# Patient Record
Sex: Female | Born: 1960 | Race: White | Hispanic: No | Smoking: Never smoker
Health system: Southern US, Community
[De-identification: ages and names within clinical notes are randomized; demographics above are authoritative.]

## PROBLEM LIST (undated history)

## (undated) DIAGNOSIS — B9561 Methicillin susceptible Staphylococcus aureus infection as the cause of diseases classified elsewhere: Secondary | ICD-10-CM

## (undated) DIAGNOSIS — K760 Fatty (change of) liver, not elsewhere classified: Secondary | ICD-10-CM

## (undated) DIAGNOSIS — E119 Type 2 diabetes mellitus without complications: Secondary | ICD-10-CM

## (undated) DIAGNOSIS — E669 Obesity, unspecified: Secondary | ICD-10-CM

## (undated) DIAGNOSIS — G894 Chronic pain syndrome: Secondary | ICD-10-CM

## (undated) DIAGNOSIS — Z1211 Encounter for screening for malignant neoplasm of colon: Secondary | ICD-10-CM

## (undated) DIAGNOSIS — M545 Low back pain: Secondary | ICD-10-CM

## (undated) DIAGNOSIS — G5603 Carpal tunnel syndrome, bilateral upper limbs: Secondary | ICD-10-CM

## (undated) DIAGNOSIS — I10 Essential (primary) hypertension: Secondary | ICD-10-CM

## (undated) DIAGNOSIS — M009 Pyogenic arthritis, unspecified: Secondary | ICD-10-CM

## (undated) DIAGNOSIS — E042 Nontoxic multinodular goiter: Secondary | ICD-10-CM

## (undated) DIAGNOSIS — R400 Somnolence: Secondary | ICD-10-CM

## (undated) DIAGNOSIS — F32A Depression, unspecified: Secondary | ICD-10-CM

## (undated) DIAGNOSIS — M5136 Other intervertebral disc degeneration, lumbar region: Secondary | ICD-10-CM

## (undated) DIAGNOSIS — S92912A Unspecified fracture of left toe(s), initial encounter for closed fracture: Secondary | ICD-10-CM

## (undated) DIAGNOSIS — E785 Hyperlipidemia, unspecified: Secondary | ICD-10-CM

## (undated) DIAGNOSIS — M51369 Other intervertebral disc degeneration, lumbar region without mention of lumbar back pain or lower extremity pain: Secondary | ICD-10-CM

## (undated) DIAGNOSIS — Z8639 Personal history of other endocrine, nutritional and metabolic disease: Secondary | ICD-10-CM

## (undated) DIAGNOSIS — R7881 Bacteremia: Secondary | ICD-10-CM

## (undated) DIAGNOSIS — G43909 Migraine, unspecified, not intractable, without status migrainosus: Secondary | ICD-10-CM

## (undated) DIAGNOSIS — M722 Plantar fascial fibromatosis: Secondary | ICD-10-CM

## (undated) DIAGNOSIS — N3946 Mixed incontinence: Secondary | ICD-10-CM

## (undated) DIAGNOSIS — N281 Cyst of kidney, acquired: Secondary | ICD-10-CM

## (undated) DIAGNOSIS — D509 Iron deficiency anemia, unspecified: Secondary | ICD-10-CM

## (undated) DIAGNOSIS — F419 Anxiety disorder, unspecified: Secondary | ICD-10-CM

## (undated) DIAGNOSIS — R9439 Abnormal result of other cardiovascular function study: Secondary | ICD-10-CM

## (undated) DIAGNOSIS — F329 Major depressive disorder, single episode, unspecified: Secondary | ICD-10-CM

## (undated) HISTORY — PX: INCISION AND DRAINAGE HIP: SHX1801

## (undated) HISTORY — DX: Chronic pain syndrome: G89.4

## (undated) HISTORY — DX: Bacteremia: R78.81

## (undated) HISTORY — DX: Essential (primary) hypertension: I10

## (undated) HISTORY — DX: Carpal tunnel syndrome, bilateral upper limbs: G56.03

## (undated) HISTORY — DX: Hyperlipidemia, unspecified: E78.5

## (undated) HISTORY — DX: Pyogenic arthritis, unspecified: M00.9

## (undated) HISTORY — DX: Depression, unspecified: F32.A

## (undated) HISTORY — DX: Migraine, unspecified, not intractable, without status migrainosus: G43.909

## (undated) HISTORY — DX: Obesity, unspecified: E66.9

## (undated) HISTORY — DX: Anxiety disorder, unspecified: F41.9

## (undated) HISTORY — DX: Plantar fascial fibromatosis: M72.2

## (undated) HISTORY — DX: Type 2 diabetes mellitus without complications: E11.9

## (undated) HISTORY — DX: Low back pain: M54.5

## (undated) HISTORY — DX: Encounter for screening for malignant neoplasm of colon: Z12.11

## (undated) HISTORY — DX: Somnolence: R40.0

## (undated) HISTORY — DX: Other intervertebral disc degeneration, lumbar region: M51.36

## (undated) HISTORY — DX: Iron deficiency anemia, unspecified: D50.9

## (undated) HISTORY — DX: Unspecified fracture of left toe(s), initial encounter for closed fracture: S92.912A

## (undated) HISTORY — DX: Major depressive disorder, single episode, unspecified: F32.9

## (undated) HISTORY — DX: Personal history of other endocrine, nutritional and metabolic disease: Z86.39

## (undated) HISTORY — DX: Mixed incontinence: N39.46

## (undated) HISTORY — DX: Nontoxic multinodular goiter: E04.2

## (undated) HISTORY — DX: Abnormal result of other cardiovascular function study: R94.39

## (undated) HISTORY — DX: Methicillin susceptible Staphylococcus aureus infection as the cause of diseases classified elsewhere: B95.61

## (undated) HISTORY — DX: Other intervertebral disc degeneration, lumbar region without mention of lumbar back pain or lower extremity pain: M51.369

---

## 2007-06-17 DIAGNOSIS — Z8639 Personal history of other endocrine, nutritional and metabolic disease: Secondary | ICD-10-CM

## 2007-06-17 HISTORY — DX: Personal history of other endocrine, nutritional and metabolic disease: Z86.39

## 2010-08-26 ENCOUNTER — Emergency Department (HOSPITAL_COMMUNITY): Payer: No Typology Code available for payment source

## 2010-08-26 ENCOUNTER — Emergency Department (HOSPITAL_COMMUNITY)
Admission: EM | Admit: 2010-08-26 | Discharge: 2010-08-26 | Disposition: A | Discharge status: Home / Self Care | Payer: No Typology Code available for payment source | Attending: Emergency Medicine | Admitting: Emergency Medicine

## 2010-08-26 DIAGNOSIS — Y92410 Unspecified street and highway as the place of occurrence of the external cause: Secondary | ICD-10-CM | POA: Insufficient documentation

## 2010-08-26 DIAGNOSIS — M542 Cervicalgia: Secondary | ICD-10-CM | POA: Insufficient documentation

## 2010-08-26 DIAGNOSIS — IMO0002 Reserved for concepts with insufficient information to code with codable children: Secondary | ICD-10-CM | POA: Insufficient documentation

## 2010-08-26 DIAGNOSIS — M545 Low back pain, unspecified: Secondary | ICD-10-CM | POA: Insufficient documentation

## 2011-06-17 DIAGNOSIS — D509 Iron deficiency anemia, unspecified: Secondary | ICD-10-CM

## 2011-06-17 HISTORY — DX: Iron deficiency anemia, unspecified: D50.9

## 2011-12-06 DIAGNOSIS — F329 Major depressive disorder, single episode, unspecified: Secondary | ICD-10-CM | POA: Insufficient documentation

## 2011-12-11 ENCOUNTER — Encounter: Payer: Self-pay | Admitting: Cardiovascular Disease

## 2011-12-11 ENCOUNTER — Ambulatory Visit (INDEPENDENT_AMBULATORY_CARE_PROVIDER_SITE_OTHER): Payer: 59 | Admitting: Cardiovascular Disease

## 2011-12-11 ENCOUNTER — Ambulatory Visit (INDEPENDENT_AMBULATORY_CARE_PROVIDER_SITE_OTHER): Payer: 59 | Admitting: Family Medicine

## 2011-12-11 ENCOUNTER — Encounter: Payer: Self-pay | Admitting: Family Medicine

## 2011-12-11 VITALS — BP 152/90 | HR 94 | Ht 65.0 in | Wt 255.2 lb

## 2011-12-11 VITALS — BP 147/86 | HR 70 | Temp 96.9°F | Ht 65.0 in | Wt 254.0 lb

## 2011-12-11 DIAGNOSIS — R079 Chest pain, unspecified: Secondary | ICD-10-CM

## 2011-12-11 DIAGNOSIS — I10 Essential (primary) hypertension: Secondary | ICD-10-CM

## 2011-12-11 MED ORDER — SIMVASTATIN 40 MG PO TABS: 40.0000 mg | ORAL_TABLET | Freq: Every day | ORAL | Status: DC

## 2011-12-11 MED ORDER — METOPROLOL TARTRATE 25 MG PO TABS: 25.0000 mg | ORAL_TABLET | Freq: Two times a day (BID) | ORAL | Status: DC
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
## 2011-12-11 NOTE — Patient Instructions (Signed)
Your physician has requested that you have a lexiscan myoview.  Please follow instruction sheet, as given.  Your physician recommends that you schedule a follow-up appointment in: 3 months WITH EKG  Your physician has recommended you make the following change in your medication:   STOP ARMODROFINIL/ NUVIGIL  DECREASE SIMVASTATIN TO 40 MG DAILY  START METOPROL 25 MG ONE TABLET TWICE DAILY 12 HOURS APART FOR HIGH BLOOD PRESSURE AND SLIGHTLY FAST HEART RATE.

## 2011-12-11 NOTE — Assessment & Plan Note (Signed)
Atypical and some typical features as well. Given her cardiac RF's (DM 2, HTN, hyperlipidemia, sedentary, obesity) and the fact that women can often present with CAD with atypical features, I think she needs attention ASAP (but not emergently since she is currently symptom-free) with cardiology.  Dr. Elease Hashimoto at [facility] heart care has graciously agreed to see her this afternoon in his office at 2:15 pm. Discussed this with pt and she agrees to go to this appt.
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
## 2011-12-11 NOTE — Assessment & Plan Note (Signed)
Letrice presents today with atypical episodes of chest pain. These episodes have been occurring for the past several weeks. They occur off and on all day long. They last for as long as 10 minutes. They typically brought on by stress. They're not associated with exercise, eating, treatment, change of position, or taking a deep breath. She was seen by his medical doctor today who referred her over here for further evaluation.  She has multiple risk factors for coronary artery disease including obesity, hypertension, hyperlipidemia, and diabetes mellitus. She also has poor R-wave progression her EKG. I cannot exclude coronary artery disease. I suspect that her poor R wave progression is due to her obesity.  We'll start her on metoprolol 25 mg twice a day. We will see her again in 3 months for followup visit for further evaluation of her heart rate and blood pressure and to make sure that she's tolerating the Toprol. After this I will turn her back over to her medical Dr.

## 2011-12-11 NOTE — Assessment & Plan Note (Signed)
Poor control lately, but normal in office today. Will make no changes in meds at this time, but she'll check bp daily and log it and I'll recheck her in the office next week. In the meantime, she needs to d/c her Nuvigil since this is contraindicated in poorly controlled HTN.

## 2011-12-11 NOTE — Assessment & Plan Note (Signed)
Her heart rate and blood pressure are bit high. I think that she will benefit from low-dose beta blocker.

## 2011-12-19 ENCOUNTER — Ambulatory Visit (INDEPENDENT_AMBULATORY_CARE_PROVIDER_SITE_OTHER): Payer: 59 | Admitting: Family Medicine

## 2011-12-19 ENCOUNTER — Encounter: Payer: Self-pay | Admitting: Family Medicine

## 2011-12-19 VITALS — BP 117/75 | HR 54 | Ht 65.0 in | Wt 258.0 lb

## 2011-12-19 DIAGNOSIS — G8929 Other chronic pain: Secondary | ICD-10-CM | POA: Insufficient documentation

## 2011-12-19 DIAGNOSIS — D509 Iron deficiency anemia, unspecified: Secondary | ICD-10-CM | POA: Insufficient documentation

## 2011-12-19 DIAGNOSIS — E049 Nontoxic goiter, unspecified: Secondary | ICD-10-CM

## 2011-12-19 DIAGNOSIS — Z23 Encounter for immunization: Secondary | ICD-10-CM

## 2011-12-19 DIAGNOSIS — E785 Hyperlipidemia, unspecified: Secondary | ICD-10-CM | POA: Insufficient documentation

## 2011-12-19 DIAGNOSIS — M545 Low back pain, unspecified: Secondary | ICD-10-CM

## 2011-12-19 DIAGNOSIS — E119 Type 2 diabetes mellitus without complications: Secondary | ICD-10-CM

## 2011-12-19 DIAGNOSIS — I10 Essential (primary) hypertension: Secondary | ICD-10-CM

## 2011-12-19 DIAGNOSIS — G4710 Hypersomnia, unspecified: Secondary | ICD-10-CM | POA: Insufficient documentation

## 2011-12-19 DIAGNOSIS — E042 Nontoxic multinodular goiter: Secondary | ICD-10-CM | POA: Insufficient documentation

## 2011-12-19 DIAGNOSIS — R079 Chest pain, unspecified: Secondary | ICD-10-CM

## 2011-12-19 HISTORY — DX: Low back pain, unspecified: M54.50

## 2011-12-19 LAB — CBC WITH DIFFERENTIAL/PLATELET
Basophils Absolute: 0 10*3/uL (ref 0.0–0.1)
Eosinophils Absolute: 0.1 10*3/uL (ref 0.0–0.7)
Lymphocytes Relative: 33.8 % (ref 12.0–46.0)
MCHC: 33.6 g/dL (ref 30.0–36.0)
Monocytes Absolute: 0.3 10*3/uL (ref 0.1–1.0)
Neutrophils Relative %: 59.5 % (ref 43.0–77.0)
Platelets: 188 10*3/uL (ref 150.0–400.0)
RBC: 3.92 Mil/uL (ref 3.87–5.11)
RDW: 13.8 % (ref 11.5–14.6)

## 2011-12-19 LAB — COMPREHENSIVE METABOLIC PANEL
ALT: 16 U/L (ref 0–35)
AST: 16 U/L (ref 0–37)
Albumin: 3.6 g/dL (ref 3.5–5.2)
Alkaline Phosphatase: 58 U/L (ref 39–117)
Calcium: 9.1 mg/dL (ref 8.4–10.5)
Chloride: 103 mEq/L (ref 96–112)
Potassium: 4.1 mEq/L (ref 3.5–5.1)
Sodium: 138 mEq/L (ref 135–145)

## 2011-12-19 LAB — LIPID PANEL
Total CHOL/HDL Ratio: 4
VLDL: 42.6 mg/dL — ABNORMAL HIGH (ref 0.0–40.0)

## 2011-12-19 LAB — MICROALBUMIN / CREATININE URINE RATIO
Microalb Creat Ratio: 0.3 mg/g (ref 0.0–30.0)
Microalb, Ur: 0.5 mg/dL (ref 0.0–1.9)

## 2011-12-19 LAB — LDL CHOLESTEROL, DIRECT: Direct LDL: 94.6 mg/dL

## 2011-12-19 LAB — TSH: TSH: 0.99 u[IU]/mL (ref 0.35–5.50)

## 2011-12-19 MED ORDER — HYDROCODONE-ACETAMINOPHEN 5-500 MG PO TABS: ORAL_TABLET | ORAL | Status: DC

## 2011-12-19 MED ORDER — LAMOTRIGINE 50 MG PO TBDP: 1.5000 | ORAL_TABLET | Freq: Every day | ORAL | Status: DC

## 2011-12-19 NOTE — Assessment & Plan Note (Signed)
Did not get to discuss much today.  She asked at the end of her appt if I could RF her vicodin 5/500 for this, says she gets #60 per month. Says she has DDD, has failed epidural injection in the past, has never had surgery recommended.  Used to see an orthopedist but sounds like it has been years since she last saw one.  I did write rx for her pain meds but will need to get better informed on her past hx and potentially come up with a plan for re-eval.

## 2011-12-19 NOTE — Assessment & Plan Note (Signed)
Numbers not reviewed today, but her most recent HbA1c > 62mo ago was <7%. Will check HbA1c today and urine microalb/cr ratio. Continue metformin at current dosing and will adjust as appropriate based on lab result. Continue ACE-I and diabetic diet.

## 2011-12-19 NOTE — Assessment & Plan Note (Signed)
Recheck FLP today. 

## 2011-12-19 NOTE — Assessment & Plan Note (Signed)
Her iron is being replaced well and her Hb had normalized per pt, but she did not do the hemoccults she was asked to do. I'll have her do iFOB.  If neg, then will allow her to put off colonoscopy until she feels more ready.  If +, will push her to get the colonoscopy sooner.  She has stopped the excessive NSAIDs she said she was on around the time of her dx of iron def anemia. Continue iron bid for now, check CBC today.
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
## 2011-12-19 NOTE — Assessment & Plan Note (Signed)
Improved. She will continue current meds and undergo a stress test soon.

## 2011-12-19 NOTE — Assessment & Plan Note (Signed)
Problem stable.  May restart nuvigil.  Continue current bp meds. Will check cr/lytes today.

## 2011-12-19 NOTE — Assessment & Plan Note (Addendum)
X years; has had multiple stable u/s's and multiple benign biopsies.  +FH of follicular thyroid cancer (Mom). Check TSH, free T4, and T3 total today. When I get old records, will review to see when last bx and u/s were done.

## 2011-12-19 NOTE — Assessment & Plan Note (Signed)
Need to r/o OSA.  She scored in high risk range on sleep questionnaire today. Continue nuvigil but I have ordered a split night sleep study to be done at [facility] sleep center.

## 2011-12-23 ENCOUNTER — Ambulatory Visit (HOSPITAL_COMMUNITY): Payer: 59 | Attending: Cardiology | Admitting: Radiology

## 2011-12-23 VITALS — Ht 65.0 in | Wt 256.0 lb

## 2011-12-23 DIAGNOSIS — R0989 Other specified symptoms and signs involving the circulatory and respiratory systems: Secondary | ICD-10-CM | POA: Insufficient documentation

## 2011-12-23 DIAGNOSIS — R42 Dizziness and giddiness: Secondary | ICD-10-CM | POA: Insufficient documentation

## 2011-12-23 DIAGNOSIS — R5383 Other fatigue: Secondary | ICD-10-CM | POA: Insufficient documentation

## 2011-12-23 DIAGNOSIS — R0602 Shortness of breath: Secondary | ICD-10-CM

## 2011-12-23 DIAGNOSIS — R5381 Other malaise: Secondary | ICD-10-CM | POA: Insufficient documentation

## 2011-12-23 DIAGNOSIS — E119 Type 2 diabetes mellitus without complications: Secondary | ICD-10-CM | POA: Insufficient documentation

## 2011-12-23 DIAGNOSIS — M25519 Pain in unspecified shoulder: Secondary | ICD-10-CM | POA: Insufficient documentation

## 2011-12-23 DIAGNOSIS — R0609 Other forms of dyspnea: Secondary | ICD-10-CM | POA: Insufficient documentation

## 2011-12-23 DIAGNOSIS — I10 Essential (primary) hypertension: Secondary | ICD-10-CM | POA: Insufficient documentation

## 2011-12-23 DIAGNOSIS — E785 Hyperlipidemia, unspecified: Secondary | ICD-10-CM | POA: Insufficient documentation

## 2011-12-23 DIAGNOSIS — R079 Chest pain, unspecified: Secondary | ICD-10-CM | POA: Insufficient documentation

## 2011-12-23 MED ORDER — TECHNETIUM TC 99M TETROFOSMIN IV KIT
32.9000 | PACK | Freq: Once | INTRAVENOUS | Status: AC | PRN
  Administered 2011-12-23: 32.9 via INTRAVENOUS

## 2011-12-23 MED ORDER — REGADENOSON 0.4 MG/5ML IV SOLN
0.4000 mg | Freq: Once | INTRAVENOUS | Status: AC
  Administered 2011-12-23: 0.4 mg via INTRAVENOUS

## 2011-12-23 NOTE — Progress Notes (Signed)
[facility] SITE 3 NUCLEAR MED [address] [place] [address] [phone]  Cardiology Nuclear Med Study  Dwight Burdo is a 51 y.o. female     MRN : [id number]     DOB: September 22, 1960  Procedure Date: 12/23/2011  Nuclear Med Background Indication for Stress Test:  Evaluation for Ischemia and Abnormal OZH:YQMV R wave progression History:  No prior known history of CAD Cardiac Risk Factors: Hypertension, Lipids and NIDDM  Symptoms: Chest Pain without exertion radiates to left shoulder and arm (last occurrence this am),  Dizziness, DOE and Fatigue with Exertion   Nuclear Pre-Procedure Caffeine/Decaff Intake:  9:00pm NPO After: 8:30am   Lungs:  clear O2 Sat: 97% on room air. IV 0.9% NS with Angio Cath:  20g  IV Site: R Antecubital  IV Started by:  Cathlyn Parsons, RN  Chest Size (in):  44 Cup Size: DD  Height: 5\' 5"  (1.651 m)  Weight:  256 lb (116.121 kg)  BMI:  Body mass index is 42.60 kg/(m^2). Tech Comments:  Metoprolol held x 14 hrs    Nuclear Med Study 1 or 2 day study: 2 day  Stress Test Type:  Treadmill/Lexiscan  Reading HQ:IONGE Nishan,M.D. Order Authorizing Provider:  Jannette Spanner  Resting Radionuclide: Technetium 28m Tetrofosmin  Resting Radionuclide Dose: 33.0 mCi on       12-24-11  Stress Radionuclide:  Technetium 73m Tetrofosmin  Stress Radionuclide Dose: 32.9 mCi on          12-23-11          Stress Protocol Rest HR: 72 Stress HR: 131  Rest BP: 131/73 Stress BP: 189/65  Exercise Time (min): 2:00 METS: n/a   Predicted Max HR: 169 bpm % Max HR: 77.51 bpm Rate Pressure Product: 95284   Dose of Adenosine (mg):  n/a Dose of Lexiscan: 0.4 mg  Dose of Atropine (mg): n/a Dose of Dobutamine: n/a mcg/kg/min (at max HR)  Stress Test Technologist: Irean Hong, RN  Nuclear Technologist:  Domenic Polite, CNMT     Rest Procedure:  Myocardial perfusion imaging was performed at rest 45 minutes following the intravenous administration of  Technetium 58m Tetrofosmin. Rest ECG: NSR with poor R wave progression  Stress Procedure:  The patient received IV Lexiscan 0.4 mg over 15-seconds with concurrent low level exercise and then Technetium 28m Tetrofosmin was injected at 30-seconds while the patient continued walking one more minute. There were no significant changes with Lexiscan. Quantitative spect images were obtained after a 45-minute delay. Stress ECG: No significant change from baseline ECG  QPS Raw Data Images:  Normal; no motion artifact; normal heart/lung ratio. Stress Images:  There is decreased uptake in the anterior wall. Rest Images:  There is decreased uptake in the anterior wall. Subtraction (SDS):  These findings are consistent with ischemia. Transient Ischemic Dilatation (Normal <1.22):  0.90 Lung/Heart Ratio (Normal <0.45):  0.32  Quantitative Gated Spect Images QGS EDV:  123 ml QGS ESV:  46 ml  Impression Exercise Capacity:  Lexiscan with low level exercise. BP Response:  Normal blood pressure response. Clinical Symptoms:  Light headed ECG Impression:  No significant ST segment change suggestive of ischemia. Comparison with Prior Nuclear Study: No images to compare  Overall Impression:  Low risk stress nuclear study. Small area of mild apical ischemia  LV Ejection Fraction: 63%.  LV Wall Motion:  NL LV Function; NL Wall Motion  Charlton Haws  .  Charlton Haws

## 2011-12-24 ENCOUNTER — Ambulatory Visit (HOSPITAL_COMMUNITY): Payer: 59 | Attending: Cardiovascular Disease

## 2011-12-24 DIAGNOSIS — R0989 Other specified symptoms and signs involving the circulatory and respiratory systems: Secondary | ICD-10-CM

## 2011-12-24 MED ORDER — TECHNETIUM TC 99M TETROFOSMIN IV KIT
33.0000 | PACK | Freq: Once | INTRAVENOUS | Status: AC | PRN
  Administered 2011-12-24: 33 via INTRAVENOUS

## 2011-12-25 ENCOUNTER — Encounter: Payer: Self-pay | Admitting: Family Medicine

## 2011-12-29 ENCOUNTER — Other Ambulatory Visit: Payer: Self-pay | Admitting: Family Medicine

## 2011-12-29 ENCOUNTER — Telehealth: Payer: Self-pay | Admitting: Cardiovascular Disease

## 2011-12-29 MED ORDER — SITAGLIPTIN PHOS-METFORMIN HCL 50-1000 MG PO TABS: 1.0000 | ORAL_TABLET | Freq: Two times a day (BID) | ORAL | Status: DC

## 2011-12-29 NOTE — Telephone Encounter (Signed)
Pt would like results from [facility]

## 2011-12-29 NOTE — Telephone Encounter (Signed)
Patient called wanting to know myoview results.Patient was told Dr.Nasher wants to review images and then we will call her back.

## 2011-12-30 NOTE — Telephone Encounter (Signed)
Pt called to inform her dr Kristi Guerrero will look at it this afternoon and i will call her after. Pt agreed to plan.

## 2011-12-30 NOTE — Telephone Encounter (Signed)
Dr Elease Hashimoto contacted to read results.

## 2011-12-30 NOTE — Telephone Encounter (Signed)
Dr Elease Hashimoto has note stating mild ischemia vs apical thinning, call pt and see how she is feeling and proceed with LHC if sx . Pt very anxious on phone, denies cp but said she had some left arm pain today but realizes she is very nervous and it may be her nerves, pt willing to proceed with LHC, will call in morning with app. Pt agreed to plan, she is to go to er with further s/s.

## 2011-12-30 NOTE — Telephone Encounter (Signed)
Fu call  Pt calling you back again

## 2011-12-30 NOTE — Telephone Encounter (Signed)
Fu call °Pt calling about test results °

## 2011-12-31 ENCOUNTER — Telehealth: Payer: Self-pay | Admitting: *Deleted

## 2011-12-31 ENCOUNTER — Other Ambulatory Visit: Payer: Self-pay | Admitting: Cardiovascular Disease

## 2011-12-31 ENCOUNTER — Encounter: Payer: Self-pay | Admitting: *Deleted

## 2011-12-31 ENCOUNTER — Ambulatory Visit: Payer: 59 | Admitting: *Deleted

## 2011-12-31 ENCOUNTER — Encounter: Payer: Self-pay | Admitting: Family Medicine

## 2011-12-31 DIAGNOSIS — R079 Chest pain, unspecified: Secondary | ICD-10-CM

## 2011-12-31 DIAGNOSIS — Z01818 Encounter for other preprocedural examination: Secondary | ICD-10-CM

## 2011-12-31 DIAGNOSIS — R9439 Abnormal result of other cardiovascular function study: Secondary | ICD-10-CM

## 2011-12-31 HISTORY — DX: Abnormal result of other cardiovascular function study: R94.39

## 2011-12-31 NOTE — Telephone Encounter (Signed)
Pt to proceed with LHC due to left arm pain, states cp been better since metoprolol/ cmet and cbc done prior, ordered pt/inr and to come in today, procedure reviewed, pt agreed to plan.

## 2012-01-01 ENCOUNTER — Inpatient Hospital Stay (HOSPITAL_BASED_OUTPATIENT_CLINIC_OR_DEPARTMENT_OTHER)
Admission: RE | Admit: 2012-01-01 | Discharge: 2012-01-01 | Disposition: A | Discharge status: Home / Self Care | Payer: 59 | Source: Ambulatory Visit | Attending: Cardiovascular Disease | Admitting: Cardiovascular Disease

## 2012-01-01 ENCOUNTER — Encounter (HOSPITAL_BASED_OUTPATIENT_CLINIC_OR_DEPARTMENT_OTHER)
Admission: RE | Disposition: A | Discharge status: Home / Self Care | Payer: Self-pay | Source: Ambulatory Visit | Attending: Cardiovascular Disease

## 2012-01-01 DIAGNOSIS — E119 Type 2 diabetes mellitus without complications: Secondary | ICD-10-CM | POA: Insufficient documentation

## 2012-01-01 DIAGNOSIS — R079 Chest pain, unspecified: Secondary | ICD-10-CM

## 2012-01-01 DIAGNOSIS — E785 Hyperlipidemia, unspecified: Secondary | ICD-10-CM | POA: Insufficient documentation

## 2012-01-01 DIAGNOSIS — E669 Obesity, unspecified: Secondary | ICD-10-CM | POA: Insufficient documentation

## 2012-01-01 DIAGNOSIS — I10 Essential (primary) hypertension: Secondary | ICD-10-CM | POA: Insufficient documentation

## 2012-01-01 DIAGNOSIS — R9439 Abnormal result of other cardiovascular function study: Secondary | ICD-10-CM | POA: Insufficient documentation

## 2012-01-01 HISTORY — PX: CARDIAC CATHETERIZATION: SHX172

## 2012-01-01 SURGERY — JV LEFT HEART CATHETERIZATION WITH CORONARY ANGIOGRAM
Anesthesia: Moderate Sedation

## 2012-01-01 MED ORDER — SODIUM CHLORIDE 0.9 % IV SOLN: INTRAVENOUS | Status: DC

## 2012-01-01 MED ORDER — ACETAMINOPHEN 325 MG PO TABS: 650.0000 mg | ORAL_TABLET | ORAL | Status: DC | PRN

## 2012-01-01 MED ORDER — ONDANSETRON HCL 4 MG/2ML IJ SOLN: 4.0000 mg | Freq: Four times a day (QID) | INTRAMUSCULAR | Status: DC | PRN

## 2012-01-01 NOTE — CV Procedure (Signed)
    Cardiac Cath Note  Kristi Guerrero [id number] [birth year]  Procedure: Left  Heart Cardiac Catheterization Note Indications: chest pain, abnormal myoview  Procedure Details Consent: Obtained Time Out: Verified patient identification, verified procedure, site/side was marked, verified correct patient position, special equipment/implants available, Radiology Safety Procedures followed,  medications/allergies/relevent history reviewed, required imaging and test results available.  Performed   Medications: Fentanyl: 50 mcg IV Versed: 2 mg IV  The right femoral artery was easily canulated using a modified Seldinger technique.  Hemodynamics:   LV pressure: 143/27 Aortic pressure: 143/79  Angiography   Left Main: smooth and normal  Left anterior Descending: smooth and normal.  The 1st diag and 2nd diag are normal.  Left Circumflex: smooth and normal.  Supplies a 1st OM distribution.  Right Coronary Artery: large, smooth and normal.  PCA, PLSA are normal  LV Gram: normal LV function.EF 60-65%  Complications: No apparent complications Patient did tolerate procedure well.  Conclusions:   1. Smooth and normal coronaries.   2. Normal LV function  Will see on an as needed basis  Alvia Grove., MD, [facility] 01/01/2012, 9:14 AM Office - [phone] Pager [phone]

## 2012-01-01 NOTE — Interval H&P Note (Signed)
History and Physical Interval Note:  01/01/2012 8:27 AM  Kristi Guerrero  has presented today for surgery, with the diagnosis of CP  The various methods of treatment have been discussed with the patient and family. After consideration of risks, benefits and other options for treatment, the patient has consented to  Procedure(s) (LRB): JV LEFT HEART CATHETERIZATION WITH CORONARY ANGIOGRAM (N/A) as a surgical intervention .  The patient's history has been reviewed, patient examined, no change in status, stable for surgery.  I have reviewed the patients' chart and labs.  Questions were answered to the patient's satisfaction.     [name]

## 2012-01-01 NOTE — OR Nursing (Signed)
Tegaderm dressing applied, site level 0, bedrest begins at 0920 
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
## 2012-01-01 NOTE — OR Nursing (Signed)
Meal served 

## 2012-01-01 NOTE — OR Nursing (Signed)
Dr Elease Hashimoto at bedside to discuss results and treatment plan with pt

## 2012-01-13 ENCOUNTER — Encounter: Payer: Self-pay | Admitting: Family Medicine

## 2012-01-13 ENCOUNTER — Other Ambulatory Visit: Payer: 59

## 2012-01-25 ENCOUNTER — Encounter: Payer: Self-pay | Admitting: Family Medicine

## 2012-01-30 ENCOUNTER — Encounter (HOSPITAL_BASED_OUTPATIENT_CLINIC_OR_DEPARTMENT_OTHER): Payer: 59

## 2012-02-15 ENCOUNTER — Other Ambulatory Visit: Payer: Self-pay | Admitting: Family Medicine

## 2012-02-17 NOTE — Telephone Encounter (Signed)
eScribe request for refill on Hydrocodone Last filled - 12/19/11, #60 x 2 Last seen on - 12/19/11 Follow up - 3 months, 03/19/12 Please advise refills.

## 2012-02-17 NOTE — Telephone Encounter (Signed)
Called to [place] at [employer].

## 2012-03-02 ENCOUNTER — Other Ambulatory Visit: Payer: Self-pay | Admitting: Cardiovascular Disease

## 2012-03-03 NOTE — Telephone Encounter (Signed)
Fax Received. Refill Completed. Kristi Guerrero (R.M.A)   

## 2012-03-05 ENCOUNTER — Ambulatory Visit (INDEPENDENT_AMBULATORY_CARE_PROVIDER_SITE_OTHER): Payer: 59 | Admitting: Family Medicine

## 2012-03-05 ENCOUNTER — Encounter: Payer: Self-pay | Admitting: Family Medicine

## 2012-03-05 ENCOUNTER — Encounter (HOSPITAL_BASED_OUTPATIENT_CLINIC_OR_DEPARTMENT_OTHER): Payer: 59

## 2012-03-05 VITALS — BP 120/87 | HR 67 | Temp 96.5°F | Ht 65.0 in | Wt 246.0 lb

## 2012-03-05 DIAGNOSIS — R55 Syncope and collapse: Secondary | ICD-10-CM

## 2012-03-05 DIAGNOSIS — R079 Chest pain, unspecified: Secondary | ICD-10-CM

## 2012-03-05 DIAGNOSIS — R110 Nausea: Secondary | ICD-10-CM

## 2012-03-05 DIAGNOSIS — R1110 Vomiting, unspecified: Secondary | ICD-10-CM

## 2012-03-05 DIAGNOSIS — I10 Essential (primary) hypertension: Secondary | ICD-10-CM

## 2012-03-05 DIAGNOSIS — F419 Anxiety disorder, unspecified: Secondary | ICD-10-CM

## 2012-03-05 DIAGNOSIS — F411 Generalized anxiety disorder: Secondary | ICD-10-CM

## 2012-03-05 LAB — LIPASE: Lipase: 25 U/L (ref 11.0–59.0)

## 2012-03-05 MED ORDER — BUSPIRONE HCL 5 MG PO TABS: 2.5000 mg | ORAL_TABLET | Freq: Three times a day (TID) | ORAL | Status: DC

## 2012-03-05 MED ORDER — PROMETHAZINE HCL 25 MG PO TABS: ORAL_TABLET | ORAL | Status: DC
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
## 2012-03-07 DIAGNOSIS — F419 Anxiety disorder, unspecified: Secondary | ICD-10-CM | POA: Insufficient documentation

## 2012-03-07 NOTE — Assessment & Plan Note (Addendum)
Possible acute gastritis vs PUD.  Anxiety playing a significant role as well. She is to continue taking her omeprazole 20mg  bid and I'll check H. Pylori IgG ab today. Phenergan 25mg , 1/2-1 tab q6h prn. I recommended she take this weekend off.

## 2012-03-07 NOTE — Assessment & Plan Note (Signed)
Noncompliance with medications recently, unclear why.  She is back on them today.  She is to continue daily monitoring and if she is getting persistently abnl measurements at home/work then she is to call or return for adjustment in bp meds. No change in meds made today.

## 2012-03-07 NOTE — Assessment & Plan Note (Signed)
Atypical features but with some worrisome associated sx's (nausea, left shoulder/arm pain).   With EKGs unchanged yesterday and today compared to EKG in 11/2011, PLUS the recent cath this summer showing no CAD, I feel reassured that no further w/u needed at this time to look for CV cause.  PE doubtful given the fact that she has NO SOB, NO cough, and her 02 sats are normal on RA.  This pain could conceivably be linked to her gastritis/GERD.

## 2012-03-07 NOTE — Assessment & Plan Note (Signed)
Plus hx of borderline personality d/o.  She sees a psychiatrist and gets meds managed throught him. We did discuss possible increase in prozac today but she declined to do this b/c she has worked hard to [employer] down from 80mg  qd of this med. Also, we discussed possible benzo addition but she was not in favor of this.  She did accept an increase in her Buspar to 2.5 mg tid instead of bid--she seems to tolerate only low doses of this med.

## 2012-03-07 NOTE — Assessment & Plan Note (Signed)
Multifactorial: stress, acute illness, uncontrolled HTN.

## 2012-03-19 ENCOUNTER — Ambulatory Visit: Payer: 59 | Admitting: Family Medicine

## 2012-04-02 ENCOUNTER — Encounter: Payer: Self-pay | Admitting: Family Medicine

## 2012-04-02 ENCOUNTER — Ambulatory Visit (INDEPENDENT_AMBULATORY_CARE_PROVIDER_SITE_OTHER): Payer: 59 | Admitting: Family Medicine

## 2012-04-02 VITALS — BP 146/82 | HR 78 | Ht 65.0 in | Wt 260.0 lb

## 2012-04-02 DIAGNOSIS — M545 Low back pain, unspecified: Secondary | ICD-10-CM

## 2012-04-02 DIAGNOSIS — R079 Chest pain, unspecified: Secondary | ICD-10-CM

## 2012-04-02 MED ORDER — OXYCODONE-ACETAMINOPHEN 5-325 MG PO TABS: 1.0000 | ORAL_TABLET | ORAL | Status: DC | PRN
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
## 2012-04-06 NOTE — Assessment & Plan Note (Signed)
Minimal narcotic pain med use.  She has no definite indication for further imaging at this time (symptoms longstanding/unchanged recently except mild/mod worsening of severity). This definitely is exacerbated by her job and would greatly benefit from weight loss.  However, she won't/can't get into an exercise regimen. I agreed to manage her chronic LBP as long as her intake of these narcotics is small.  If she has growing need for multidose narcotics throughout her day just to remain functional then I will refer her to pain mgmt MD. I d/c'd her hydrocodone today and started Percocet 5/325, 1-2 tabs bid prn, #30.  We'll see how she responds to this med and I'll see her back in the office in 1 mo.  Therapeutic expectations and side effect profile of medication discussed today.  Patient's questions answered.

## 2012-04-06 NOTE — Assessment & Plan Note (Signed)
Improved.  Atypical features.  No further w/u at this time.

## 2012-04-14 ENCOUNTER — Other Ambulatory Visit: Payer: Self-pay | Admitting: Cardiovascular Disease

## 2012-04-14 NOTE — Telephone Encounter (Signed)
Fax Received. Refill Completed. Kristi Guerrero (R.M.A)   

## 2012-04-22 ENCOUNTER — Other Ambulatory Visit: Payer: Self-pay | Admitting: Family Medicine

## 2012-04-22 MED ORDER — FLUOXETINE HCL 40 MG PO CAPS: 40.0000 mg | ORAL_CAPSULE | Freq: Every day | ORAL | Status: DC

## 2012-04-22 NOTE — Telephone Encounter (Signed)
Patient is requesting a refill on fluoxetine to [facility] in [place]

## 2012-04-22 NOTE — Telephone Encounter (Signed)
Refill request for FLUOXETINE Last filled-never filled by our office Last seen-04/02/12 Follow up - 05/07/12 Refill sent per [facility] refill protocol.

## 2012-05-05 ENCOUNTER — Other Ambulatory Visit: Payer: Self-pay | Admitting: Family Medicine

## 2012-05-06 NOTE — Telephone Encounter (Signed)
eScribe request for refill on JANUMET Last seen on - 04/02/12 Refill sent per [facility] refill protocol.

## 2012-05-07 ENCOUNTER — Ambulatory Visit: Payer: 59 | Admitting: Family Medicine

## 2012-05-28 ENCOUNTER — Ambulatory Visit: Payer: 59 | Admitting: Family Medicine

## 2012-05-31 ENCOUNTER — Ambulatory Visit (INDEPENDENT_AMBULATORY_CARE_PROVIDER_SITE_OTHER): Payer: 59 | Admitting: Family Medicine

## 2012-05-31 ENCOUNTER — Encounter: Payer: Self-pay | Admitting: Family Medicine

## 2012-05-31 VITALS — BP 136/77 | HR 61 | Ht 65.0 in | Wt 262.0 lb

## 2012-05-31 DIAGNOSIS — Z1239 Encounter for other screening for malignant neoplasm of breast: Secondary | ICD-10-CM

## 2012-05-31 DIAGNOSIS — IMO0001 Reserved for inherently not codable concepts without codable children: Secondary | ICD-10-CM

## 2012-05-31 DIAGNOSIS — I10 Essential (primary) hypertension: Secondary | ICD-10-CM

## 2012-05-31 DIAGNOSIS — G44229 Chronic tension-type headache, not intractable: Secondary | ICD-10-CM

## 2012-05-31 DIAGNOSIS — M545 Low back pain, unspecified: Secondary | ICD-10-CM

## 2012-05-31 DIAGNOSIS — Z862 Personal history of diseases of the blood and blood-forming organs and certain disorders involving the immune mechanism: Secondary | ICD-10-CM

## 2012-05-31 DIAGNOSIS — G2581 Restless legs syndrome: Secondary | ICD-10-CM

## 2012-05-31 LAB — COMPREHENSIVE METABOLIC PANEL WITH GFR
ALT: 15 U/L (ref 0–35)
AST: 15 U/L (ref 0–37)
Albumin: 3.6 g/dL (ref 3.5–5.2)
Alkaline Phosphatase: 61 U/L (ref 39–117)
BUN: 9 mg/dL (ref 6–23)
CO2: 29 meq/L (ref 19–32)
Calcium: 8.9 mg/dL (ref 8.4–10.5)
Chloride: 101 meq/L (ref 96–112)
Creatinine, Ser: 0.5 mg/dL (ref 0.4–1.2)
GFR: 126.09 mL/min
Glucose, Bld: 138 mg/dL — ABNORMAL HIGH (ref 70–99)
Potassium: 3.9 meq/L (ref 3.5–5.1)
Sodium: 138 meq/L (ref 135–145)
Total Bilirubin: 0.7 mg/dL (ref 0.3–1.2)
Total Protein: 6.7 g/dL (ref 6.0–8.3)

## 2012-05-31 LAB — CBC WITH DIFFERENTIAL/PLATELET
Basophils Relative: 0.5 % (ref 0.0–3.0)
Eosinophils Relative: 0.9 % (ref 0.0–5.0)
HCT: 36.2 % (ref 36.0–46.0)
Hemoglobin: 12.6 g/dL (ref 12.0–15.0)
Lymphs Abs: 1.7 10*3/uL (ref 0.7–4.0)
MCV: 91.3 fl (ref 78.0–100.0)
Monocytes Absolute: 0.4 10*3/uL (ref 0.1–1.0)
Monocytes Relative: 5.9 % (ref 3.0–12.0)
Platelets: 198 10*3/uL (ref 150.0–400.0)
RBC: 3.97 Mil/uL (ref 3.87–5.11)
WBC: 6 10*3/uL (ref 4.5–10.5)

## 2012-05-31 MED ORDER — LORAZEPAM 0.5 MG PO TABS: 0.5000 mg | ORAL_TABLET | Freq: Two times a day (BID) | ORAL | Status: DC | PRN

## 2012-05-31 MED ORDER — HYDROCODONE-ACETAMINOPHEN 5-325 MG PO TABS: ORAL_TABLET | ORAL | Status: DC

## 2012-05-31 MED ORDER — BUTALBITAL-APAP-CAFFEINE 50-325-40 MG PO TABS: ORAL_TABLET | ORAL | Status: DC

## 2012-05-31 NOTE — Assessment & Plan Note (Signed)
Chronic, no recent change in severity or character.  No radiculopathy sx's. We'll go back to vicodin 5/325, 1-2 q6h prn since the percocet 1/2 tab at a time did not help any better. Vicodin rx given today, see orders.

## 2012-05-31 NOTE — Assessment & Plan Note (Addendum)
Last mammo >35 yrs ago per pt. Scheduled mammogram today. (+FH: mom with dx of BC 2 yrs ago). She plans on setting up formal GYN care in [place], [place] with Dr. Rudi Coco and [facility] once her job transfers there 06/2012.

## 2012-05-31 NOTE — Assessment & Plan Note (Signed)
Recheck CBC to screen for iron def (she has had this before). Lorazepam 0.5mg , 1-2 qhs prn rx'd.  Therapeutic expectations and side effect profile of medication discussed today.  Patient's questions answered.
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
## 2012-05-31 NOTE — Assessment & Plan Note (Signed)
Problem stable.  Continue current medications and diet appropriate for this condition.  We have reviewed our general long term plan for this problem and also reviewed symptoms and signs that should prompt the patient to call or return to the office. Check lytes/cr today.  

## 2012-05-31 NOTE — Assessment & Plan Note (Signed)
Noncompliant with monitoring, diet, and exercise. Foot exam normal today.  She is getting eyes taken care of through [facility] in [place] like DM 2 complications. Get records.

## 2012-05-31 NOTE — Assessment & Plan Note (Signed)
Pt has had success with use of fioricet in the past.  Restart this.  Therapeutic expectations and side effect profile of medication discussed today.  Patient's questions answered. She knows to take this med sparingly.

## 2012-06-01 LAB — HEMOGLOBIN A1C: Hgb A1c MFr Bld: 6.5 % (ref 4.6–6.5)

## 2012-06-02 ENCOUNTER — Ambulatory Visit
Admission: RE | Admit: 2012-06-02 | Discharge: 2012-06-02 | Disposition: A | Discharge status: Home / Self Care | Payer: 59 | Source: Ambulatory Visit | Attending: Family Medicine | Admitting: Family Medicine

## 2012-06-02 DIAGNOSIS — Z1239 Encounter for other screening for malignant neoplasm of breast: Secondary | ICD-10-CM

## 2012-07-05 ENCOUNTER — Other Ambulatory Visit: Payer: Self-pay | Admitting: Family Medicine

## 2012-07-05 NOTE — Telephone Encounter (Signed)
eScribe request for refill on HYDROCODONE Last filled - 05/31/12, #60 X 1 Last seen on - 05/31/12 Follow up - 4 MONTHS Please advise refills.

## 2012-07-07 ENCOUNTER — Ambulatory Visit (INDEPENDENT_AMBULATORY_CARE_PROVIDER_SITE_OTHER): Payer: 59 | Admitting: Family Medicine

## 2012-07-07 ENCOUNTER — Encounter: Payer: Self-pay | Admitting: Family Medicine

## 2012-07-07 VITALS — BP 162/80 | HR 61 | Temp 97.8°F | Ht 65.0 in | Wt 264.0 lb

## 2012-07-07 DIAGNOSIS — H9202 Otalgia, left ear: Secondary | ICD-10-CM | POA: Insufficient documentation

## 2012-07-07 DIAGNOSIS — H9209 Otalgia, unspecified ear: Secondary | ICD-10-CM
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
## 2012-07-07 NOTE — Telephone Encounter (Signed)
Faxed on 07/06/12 by Hoy Register, RN.

## 2012-07-12 ENCOUNTER — Telehealth: Payer: Self-pay | Admitting: Family Medicine

## 2012-07-12 MED ORDER — ALOGLIPTIN-METFORMIN HCL 12.5-1000 MG PO TABS: 12.5000 mg | ORAL_TABLET | Freq: Two times a day (BID) | ORAL | Status: DC

## 2012-07-12 NOTE — Telephone Encounter (Signed)
Kazano 12.5-1000mg  called in, 1 tab bid.  Samples and discount card put on your counter for her to pick up prior to filling the rx.-thx

## 2012-07-12 NOTE — Telephone Encounter (Signed)
Dr McGowen, please advise. 

## 2012-07-13 ENCOUNTER — Telehealth: Payer: Self-pay | Admitting: *Deleted

## 2012-07-13 MED ORDER — SITAGLIPTIN PHOS-METFORMIN HCL 50-1000 MG PO TABS: 1.0000 | ORAL_TABLET | Freq: Two times a day (BID) | ORAL | Status: DC

## 2012-07-13 NOTE — Telephone Encounter (Signed)
Patient called to request to stay on her medication Janumet.. Does not want to change to any other medications at this time. Dr Milinda Cave aware of this and medication sent in to pharmacy [facility].

## 2012-07-13 NOTE — Telephone Encounter (Signed)
Message left on cell voicemail that new RX has been called in and samples and discount card at front desk if she would like to pick those up.  Asked pt to call office if she has any questions.

## 2012-07-24 ENCOUNTER — Other Ambulatory Visit: Payer: Self-pay | Admitting: Family Medicine

## 2012-07-26 ENCOUNTER — Other Ambulatory Visit: Payer: Self-pay | Admitting: Family Medicine

## 2012-07-27 MED ORDER — LISINOPRIL 30 MG PO TABS: 30.0000 mg | ORAL_TABLET | Freq: Every day | ORAL | Status: DC

## 2012-07-27 NOTE — Telephone Encounter (Signed)
Refill request for LISINOPRIL Last filled-  Last seen- 05/31/12 Follow up -09/29/12 RX SENT.  eScribe request for refill on LORAZEPAM Last filled - 05/31/12, #30 X 2 (1 BID PRN) pharmacy filled on 12/21, 1/8, 1/23 Last seen on - 05/31/12 Follow up - 09/29/12 Please advise refills.  Pt is using almost daily.  Do we need to increase quantity?

## 2012-07-27 NOTE — Telephone Encounter (Signed)
RX faxed

## 2012-07-27 NOTE — Telephone Encounter (Signed)
Rx printed.  Quantity increased and enough RFs were given to get her to her next f/u appt with me.-thx

## 2012-07-27 NOTE — Telephone Encounter (Signed)
eScribe request for refill on FLUOXETINE Last filled - 04/22/12, #30 X 2 RX sent  eScribe request for refill on BUSPIRONE Last filled - 03/05/12, #90 X 1 Last seen on - 05/31/12 Follow up - 09/29/12 RX sent

## 2012-08-02 ENCOUNTER — Other Ambulatory Visit: Payer: Self-pay | Admitting: Family Medicine

## 2012-08-02 NOTE — Telephone Encounter (Signed)
eScribe request for refill on LAMICTAL Last filled - 12/19/11, #270 X 1 Last seen on - 05/31/12 Follow up - 4 MONTHS Please advise refills.

## 2012-08-10 ENCOUNTER — Other Ambulatory Visit: Payer: Self-pay | Admitting: Family Medicine

## 2012-08-11 NOTE — Telephone Encounter (Signed)
eScribe request for refill on HYDROCODONE Last filled - 07/05/12, #60 X 1 Last seen on - 05/31/12  Follow up - 4 MONTHS  Please advise refills.

## 2012-08-12 NOTE — Telephone Encounter (Signed)
RX faxed

## 2012-08-16 ENCOUNTER — Telehealth: Payer: Self-pay | Admitting: *Deleted

## 2012-08-16 MED ORDER — SAXAGLIPTIN-METFORMIN ER 5-1000 MG PO TB24: ORAL_TABLET | ORAL | Status: DC

## 2012-08-16 NOTE — Telephone Encounter (Signed)
Pt notified and is agreeable. 

## 2012-08-16 NOTE — Telephone Encounter (Signed)
Faxed request for prior auth on Janumet received from pharmacy.  Phone number given on fax is for medicaid [employer], which patient is not a member of.   Pharmacy information also states that insurance will pay for  Kombiglyze XR or Jentodueta.   Please advise if either of these would be an option for patient.  Pt has also left message that she is out of meds.

## 2012-08-16 NOTE — Telephone Encounter (Signed)
OK. Kombiglyze XR 10/998, 1 tab qd with evening meal rx'd. Stop janumet.

## 2012-09-16 ENCOUNTER — Other Ambulatory Visit: Payer: Self-pay

## 2012-09-16 MED ORDER — SIMVASTATIN 40 MG PO TABS: ORAL_TABLET | ORAL | Status: DC

## 2012-09-20 ENCOUNTER — Other Ambulatory Visit: Payer: Self-pay | Admitting: Family Medicine

## 2012-09-21 NOTE — Telephone Encounter (Signed)
eScribe request for refill on HYDROCODONE Last filled - 08/11/12, #60 X 1 Last seen on - 05/31/12 Follow up - 09/29/12 Please advise refills.

## 2012-09-22 ENCOUNTER — Ambulatory Visit (INDEPENDENT_AMBULATORY_CARE_PROVIDER_SITE_OTHER): Payer: 59 | Admitting: Family Medicine

## 2012-09-22 ENCOUNTER — Encounter: Payer: Self-pay | Admitting: Family Medicine

## 2012-09-22 VITALS — BP 120/72 | HR 64 | Temp 96.2°F | Ht 65.0 in | Wt 256.0 lb

## 2012-09-22 DIAGNOSIS — M26629 Arthralgia of temporomandibular joint, unspecified side: Secondary | ICD-10-CM | POA: Insufficient documentation

## 2012-09-22 NOTE — Patient Instructions (Signed)
Buy a mouth guard/mouthpiece OTC at any pharmacy to sleep in.  If you tolerate sleeping with this in your mouth, go to your dentist to get a custom made mouthpiece for TMJ. Take ibuprofen 600 mg three times a day with food. Do your TMJ exercises several times per day.
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
## 2012-09-22 NOTE — Telephone Encounter (Signed)
RX given to patient at her visit today.

## 2012-09-26 NOTE — Assessment & Plan Note (Signed)
Discussed TMJ exercises, gave handout demonstrating these.  Discussed getting an OTC mouthpiece/mouthguard to wear hs to see if she tolerates this.  If so, then next step is to go to dentist to get one custom made.  Continue 600mg  ibuprofen tid with food.

## 2012-09-29 ENCOUNTER — Encounter: Payer: Self-pay | Admitting: Family Medicine

## 2012-09-29 ENCOUNTER — Ambulatory Visit (INDEPENDENT_AMBULATORY_CARE_PROVIDER_SITE_OTHER): Payer: 59 | Admitting: Family Medicine

## 2012-09-29 VITALS — BP 150/82 | HR 72 | Ht 65.0 in | Wt 262.0 lb

## 2012-09-29 DIAGNOSIS — Z23 Encounter for immunization: Secondary | ICD-10-CM

## 2012-09-29 DIAGNOSIS — G5601 Carpal tunnel syndrome, right upper limb: Secondary | ICD-10-CM

## 2012-09-29 DIAGNOSIS — E119 Type 2 diabetes mellitus without complications: Secondary | ICD-10-CM

## 2012-09-29 DIAGNOSIS — I10 Essential (primary) hypertension: Secondary | ICD-10-CM

## 2012-09-29 DIAGNOSIS — IMO0001 Reserved for inherently not codable concepts without codable children: Secondary | ICD-10-CM

## 2012-09-29 DIAGNOSIS — G5600 Carpal tunnel syndrome, unspecified upper limb: Secondary | ICD-10-CM

## 2012-09-29 LAB — BASIC METABOLIC PANEL
CO2: 26 mEq/L (ref 19–32)
Calcium: 8.6 mg/dL (ref 8.4–10.5)
Chloride: 105 mEq/L (ref 96–112)
Creat: 0.61 mg/dL (ref 0.50–1.10)
Sodium: 140 mEq/L (ref 135–145)

## 2012-09-29 LAB — HEMOGLOBIN A1C
Hgb A1c MFr Bld: 6.3 % — ABNORMAL HIGH (ref <5.7)
Mean Plasma Glucose: 134 mg/dL — ABNORMAL HIGH (ref <117)

## 2012-09-29 NOTE — Assessment & Plan Note (Signed)
Good control. Foot exam normal today.  Updated eye exam in chart (05/2012). Continue current meds, diet, and monitoring.

## 2012-09-29 NOTE — Assessment & Plan Note (Signed)
Pt cannot tolerate wrist splints.  She is not having sx's severe enough to consider surgery.  She'll try to wear the splints more.

## 2012-09-29 NOTE — Assessment & Plan Note (Signed)
Encouraged home bp monitoring. No med change made today. Check BMET.

## 2012-09-29 NOTE — Progress Notes (Signed)
OFFICE NOTE  09/29/2012  CC:  Chief Complaint  Patient presents with  . Follow-up    DM, HTN, HA     HPI: Patient is a 52 y.o. Caucasian female who is here for routine f/u DM 2, HTN, anxiety. Occ glucose check show normal numbers per pt but no log book with her. She does not monitor her BP at home or work. Anxiety is pretty stable. Compliant with all meds.    She says she last had diabetic retinopathy screen 05/2012--normal. She does not recall when she had her last tetanus booster.  She denies any pain, tingling, or numbness in feet.  She reports frequent feeling of swelling (notes subtle swelling) in right hand upon awakening in the morning, sometimes feels tingling/numbness in parts of hand/fingers, also nagging right wrist pain that extends up forearm some--usually the worst in the morning.  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  No redness of hand, no warmth of hand, no pain in hand but does say it feels stiff/difficult to fully close upon first getting up in morning.  Pertinent PMH:  DM 2, no complications, well controlled. HTN Hyperlipidemia Chronic HA syndrome Anxiety Mood disorder Chronic low back pain RLS GERD  MEDS:  Outpatient Prescriptions Prior to Visit  Medication Sig Dispense Refill  . aspirin 81 MG tablet Take 81 mg by mouth daily.      . busPIRone (BUSPAR) 5 MG tablet TAKE 1/2 TABLET BY MOUTH 3 TIMES A DAY  90 tablet  2  . butalbital-acetaminophen-caffeine (FIORICET, ESGIC) 50-325-40 MG per tablet 1-2 tabs po q6h prn headache  30 tablet  1  . ferrous sulfate 325 (65 FE) MG tablet Take 325 mg by mouth daily with breakfast.      . FLUoxetine (PROZAC) 40 MG capsule take 1 capsule by mouth once daily  30 capsule  2  . HYDROcodone-acetaminophen (NORCO/VICODIN) 5-325 MG per tablet take 1 to 2 tablets by mouth every 6 hours if needed  for pain  60 tablet  1  . JANUMET 50-1000 MG per tablet Take 1 tablet by mouth 2 (two) times daily.      Marland Kitchen lamoTRIgine (LAMICTAL) 25 MG tablet TAKE 3 TABLETS EVERY NIGHT AT BEDTIME  270 tablet  1  . lisinopril (PRINIVIL,ZESTRIL) 30 MG tablet Take 1 tablet (30 mg total) by mouth daily.  30 tablet  5  . LORazepam (ATIVAN) 0.5 MG tablet take 1 tablet by mouth twice a day if needed  60 tablet  3  . metoprolol tartrate (LOPRESSOR) 25 MG tablet take 1 tablet by mouth twice a day  60 tablet  5  . Multiple Vitamin (MULTIVITAMIN) tablet Take 1 tablet by mouth daily.      . Omega-3 Fatty Acids (FISH OIL) 1000 MG CAPS Take 1 capsule by mouth at bedtime.      Marland Kitchen omeprazole (PRILOSEC) 20 MG capsule Take 20 mg by mouth 2 (two) times daily.      . promethazine (PHENERGAN) 25 MG tablet 1/2-1 tab po q6h prn  30 tablet  0  . simvastatin (ZOCOR) 40 MG tablet TAKE 1 TABLET EVERY NIGHT AT BEDTIME  30 tablet  2  . oxyCODONE-acetaminophen (PERCOCET/ROXICET) 5-325 MG per tablet Take 1 tablet by mouth every 4 (four) hours as needed for pain.  30 tablet  0   No facility-administered medications  prior to visit.    PE: Blood pressure 150/82, pulse 72, height 5\' 5"  (1.651 m), weight 262 lb (118.842 kg). Gen: Alert, well appearing.  Patient is oriented to person, place, time, and situation. FEET: trace edema.  No calluses or erythema.  No skin breakdown.  DP pulses 2+ bilat. Sensation intact in all areas of both feet.    IMPRESSION AND PLAN:  Carpal tunnel syndrome of right wrist Pt cannot tolerate wrist splints.  She is not having sx's severe enough to consider surgery.  She'll try to wear the splints more.  Type II or unspecified type diabetes mellitus without mention of complication, uncontrolled Good control. Foot exam normal today.  Updated eye exam in chart (05/2012). Continue current meds, diet, and monitoring.  HTN (hypertension), benign Encouraged home bp monitoring. No med change made today. Check  BMET.   An After Visit Summary was printed and given to the patient.  FOLLOW UP: 6 mo

## 2012-09-30 ENCOUNTER — Telehealth: Payer: Self-pay | Admitting: Family Medicine

## 2012-09-30 NOTE — Telephone Encounter (Signed)
Patient is checking to see if Rx fax came in from mail order pharmacy. Patient would like to pick up samples this afternoon, she is out of med.

## 2012-10-04 NOTE — Telephone Encounter (Signed)
Gave Janumet 50-1000 to patient until she receives her rx from mail order.

## 2012-10-05 ENCOUNTER — Other Ambulatory Visit: Payer: Self-pay | Admitting: *Deleted

## 2012-10-05 MED ORDER — SITAGLIPTIN PHOS-METFORMIN HCL 50-1000 MG PO TABS: 1.0000 | ORAL_TABLET | Freq: Two times a day (BID) | ORAL | Status: DC

## 2012-10-05 NOTE — Telephone Encounter (Signed)
Rx request to pharmacy/SLS  

## 2012-10-28 ENCOUNTER — Other Ambulatory Visit: Payer: Self-pay | Admitting: Family Medicine

## 2012-10-28 NOTE — Telephone Encounter (Signed)
eScribe request for refill on FLUOXETINE Last filled - 02.08.14, #30x2  Last seen on - 04.16.14  Follow up - 6 months Please advise refills/SLS

## 2012-10-30 ENCOUNTER — Other Ambulatory Visit: Payer: Self-pay | Admitting: Family Medicine

## 2012-11-01 NOTE — Telephone Encounter (Signed)
Rx request to pharmacy/SLS  

## 2012-11-03 ENCOUNTER — Telehealth: Payer: Self-pay | Admitting: Family Medicine

## 2012-11-03 ENCOUNTER — Other Ambulatory Visit: Payer: Self-pay | Admitting: Family Medicine

## 2012-11-03 NOTE — Telephone Encounter (Signed)
Rx request faxed to pharmacy; pt informed/SLS  

## 2012-11-03 NOTE — Telephone Encounter (Signed)
PLEASE SEE SURE SCRIPT REQUEST transferred into phone note OPEN 05.21.14/SLS

## 2012-11-03 NOTE — Telephone Encounter (Signed)
Fax request for refill on HYDROCODONE-APAP Last filled - 04.07.14, #60x1 Last seen on - 04.16.14  Follow up - 6 months  Please advise refills/SLS

## 2012-12-09 ENCOUNTER — Other Ambulatory Visit: Payer: Self-pay | Admitting: Family Medicine

## 2012-12-10 ENCOUNTER — Telehealth: Payer: Self-pay | Admitting: *Deleted

## 2012-12-10 MED ORDER — HYDROCODONE-ACETAMINOPHEN 5-325 MG PO TABS: ORAL_TABLET | ORAL | Status: DC

## 2012-12-10 NOTE — Telephone Encounter (Signed)
Faxed rx to pharmacy  

## 2012-12-21 ENCOUNTER — Other Ambulatory Visit: Payer: Self-pay | Admitting: Family Medicine

## 2012-12-21 NOTE — Telephone Encounter (Signed)
Refill request for  Ativan Last filled by MD on -07/27/12 #60 x3 Last AEX - 09/22/12 F/U- 03/31/13 Please advise refill?

## 2012-12-27 ENCOUNTER — Other Ambulatory Visit: Payer: Self-pay | Admitting: Family Medicine

## 2013-01-16 ENCOUNTER — Other Ambulatory Visit: Payer: Self-pay | Admitting: Family Medicine

## 2013-01-19 ENCOUNTER — Other Ambulatory Visit: Payer: Self-pay

## 2013-01-27 ENCOUNTER — Other Ambulatory Visit: Payer: Self-pay | Admitting: Family Medicine

## 2013-01-27 NOTE — Telephone Encounter (Signed)
OK'd meds, butalb/apap/caff printed.

## 2013-01-27 NOTE — Telephone Encounter (Signed)
Patients last appointment was 4/16, got fioricet 12/16 x 1 refill.  Patient not due for follow up until November.  Please advise refills.

## 2013-02-04 ENCOUNTER — Other Ambulatory Visit: Payer: Self-pay | Admitting: Family Medicine

## 2013-02-04 MED ORDER — SITAGLIPTIN PHOS-METFORMIN HCL 50-1000 MG PO TABS: 1.0000 | ORAL_TABLET | Freq: Two times a day (BID) | ORAL | Status: DC

## 2013-02-07 ENCOUNTER — Other Ambulatory Visit: Payer: Self-pay | Admitting: *Deleted

## 2013-02-07 MED ORDER — HYDROCODONE-ACETAMINOPHEN 5-325 MG PO TABS: ORAL_TABLET | ORAL | Status: DC

## 2013-02-07 NOTE — Telephone Encounter (Signed)
Rx printed

## 2013-02-07 NOTE — Telephone Encounter (Signed)
Refill request for Hydrocodon/Acetaminophin Last filled by MD on - 12/10/12 #120 x1 Last Seen- 09/29/12 Next Appt: 03/31/13 Please advise refill?

## 2013-02-07 NOTE — Telephone Encounter (Signed)
faxed to pharmacy

## 2013-02-25 ENCOUNTER — Other Ambulatory Visit: Payer: Self-pay | Admitting: Family Medicine

## 2013-03-15 ENCOUNTER — Telehealth: Payer: Self-pay | Admitting: Family Medicine

## 2013-03-15 NOTE — Telephone Encounter (Signed)
Pharmacy faxed med refill request for oxybutynin 5mg .  I don't see this in her chart.  Please advise.

## 2013-03-15 NOTE — Telephone Encounter (Signed)
I don't see it either. Pls contact pt and see if she requested the med from the pharmacy.  If so, has she seen a urologist in the past who rx'd it for her?  Let me know-thx

## 2013-03-23 NOTE — Telephone Encounter (Signed)
Unable to reach patient.

## 2013-03-31 ENCOUNTER — Encounter: Payer: Self-pay | Admitting: Family Medicine

## 2013-03-31 ENCOUNTER — Ambulatory Visit (INDEPENDENT_AMBULATORY_CARE_PROVIDER_SITE_OTHER): Payer: 59 | Admitting: Family Medicine

## 2013-03-31 VITALS — BP 134/85 | HR 74 | Temp 97.8°F | Resp 18 | Ht 65.0 in | Wt 260.0 lb

## 2013-03-31 DIAGNOSIS — M545 Low back pain, unspecified: Secondary | ICD-10-CM

## 2013-03-31 DIAGNOSIS — I10 Essential (primary) hypertension: Secondary | ICD-10-CM

## 2013-03-31 DIAGNOSIS — F329 Major depressive disorder, single episode, unspecified: Secondary | ICD-10-CM

## 2013-03-31 DIAGNOSIS — E042 Nontoxic multinodular goiter: Secondary | ICD-10-CM

## 2013-03-31 DIAGNOSIS — F3132 Bipolar disorder, current episode depressed, moderate: Secondary | ICD-10-CM

## 2013-03-31 DIAGNOSIS — E119 Type 2 diabetes mellitus without complications: Secondary | ICD-10-CM

## 2013-03-31 DIAGNOSIS — G4710 Hypersomnia, unspecified: Secondary | ICD-10-CM

## 2013-03-31 DIAGNOSIS — IMO0001 Reserved for inherently not codable concepts without codable children: Secondary | ICD-10-CM

## 2013-03-31 DIAGNOSIS — G8929 Other chronic pain: Secondary | ICD-10-CM

## 2013-03-31 DIAGNOSIS — F32A Depression, unspecified: Secondary | ICD-10-CM

## 2013-03-31 DIAGNOSIS — F3289 Other specified depressive episodes: Secondary | ICD-10-CM

## 2013-03-31 LAB — BASIC METABOLIC PANEL
BUN: 8 mg/dL (ref 6–23)
CO2: 28 mEq/L (ref 19–32)
Calcium: 9 mg/dL (ref 8.4–10.5)
Potassium: 4 mEq/L (ref 3.5–5.3)
Sodium: 138 mEq/L (ref 135–145)

## 2013-03-31 MED ORDER — LISINOPRIL 40 MG PO TABS: 40.0000 mg | ORAL_TABLET | Freq: Every day | ORAL | Status: DC

## 2013-03-31 MED ORDER — LAMOTRIGINE 100 MG PO TABS: 100.0000 mg | ORAL_TABLET | Freq: Every day | ORAL | Status: DC

## 2013-03-31 NOTE — Patient Instructions (Signed)
Take 100mg  lamictal nightly for 7d, then increase to 150mg  nightly (lamictal 100mg  + 2 of the 25mg  lamictal tabs every night).
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
## 2013-04-01 ENCOUNTER — Telehealth: Payer: Self-pay | Admitting: Family Medicine

## 2013-04-01 DIAGNOSIS — F3132 Bipolar disorder, current episode depressed, moderate: Secondary | ICD-10-CM | POA: Insufficient documentation

## 2013-04-01 LAB — TSH: TSH: 1.294 u[IU]/mL (ref 0.350–4.500)

## 2013-04-01 MED ORDER — OMEPRAZOLE 40 MG PO CPDR: 40.0000 mg | DELAYED_RELEASE_CAPSULE | Freq: Every day | ORAL | Status: DC

## 2013-04-01 NOTE — Assessment & Plan Note (Signed)
Poor control as per her home monitoring. HbA1c today.  Urine microalb/cr today. Next step up would be getting her on victoza or insulin, depending on how high her A1c is.

## 2013-04-01 NOTE — Telephone Encounter (Signed)
Spoke to patient she does have enough lamictal to get through.  I told her that Dr. Milinda Cave sent in lisinopril yesterday and he told me to rx prilosec 40mg  QD 30 x 6 refills.  Patient aware.

## 2013-04-01 NOTE — Assessment & Plan Note (Signed)
X years; has had multiple stable u/s's and multiple benign biopsies.  +FH of follicular thyroid cancer (Mom). Plan is to obtain f/u neck u/s in near future after she is more stable from a psych standpoint. Check TSH today.

## 2013-04-01 NOTE — Assessment & Plan Note (Signed)
Increase lamictal to 100mg  qd x 7d, then increase to 150mg  qd.  Therapeutic expectations and side effect profile of medication discussed today.  Patient's questions answered. Continue same dose of prozac for now but will likely increase this in the future (up to a max of 80 mg qd). She'll consider getting established with a local counselor/therapist. I recommended she try to NOT work excessively and that she START to exercise some.

## 2013-04-01 NOTE — Telephone Encounter (Signed)
Patient states that you increased her lisinopril and lamictal but didn't sent new rx to pharmacy.  She also wanted to see if she could get a RX for prilosec 40mg .  Please advise.

## 2013-04-01 NOTE — Assessment & Plan Note (Signed)
Not ideally controlled. Will increase lisinopril from 30mg  qd to 40mg  qd. We'll be getting her referred to pulm in the near future to further eval for OSA.

## 2013-04-01 NOTE — Assessment & Plan Note (Signed)
Controlled substance contract reviewed with patient today.  Patient signed this and it will be placed in the chart.   No new rx for this problem given today. UDS today.

## 2013-04-01 NOTE — Assessment & Plan Note (Signed)
Suspect this is acute (from depression) on chronic (possible OSA). Plan is for pulm referral to be ordered in near future when she is better from a psych standpoint--further eval for possible OSA.

## 2013-04-04 ENCOUNTER — Other Ambulatory Visit: Payer: Self-pay | Admitting: Family Medicine

## 2013-04-04 MED ORDER — LIRAGLUTIDE 18 MG/3ML ~~LOC~~ SOPN: 1.2000 mg | PEN_INJECTOR | Freq: Every day | SUBCUTANEOUS | Status: DC

## 2013-04-04 MED ORDER — METFORMIN HCL 1000 MG PO TABS: 1000.0000 mg | ORAL_TABLET | Freq: Two times a day (BID) | ORAL | Status: DC

## 2013-04-05 ENCOUNTER — Other Ambulatory Visit: Payer: Self-pay | Admitting: Family Medicine

## 2013-04-05 MED ORDER — INSULIN PEN NEEDLE 32G X 8 MM MISC: Status: DC

## 2013-04-21 ENCOUNTER — Encounter: Payer: Self-pay | Admitting: Family Medicine

## 2013-04-21 ENCOUNTER — Ambulatory Visit (INDEPENDENT_AMBULATORY_CARE_PROVIDER_SITE_OTHER): Payer: 59 | Admitting: Family Medicine

## 2013-04-21 ENCOUNTER — Other Ambulatory Visit: Payer: Self-pay

## 2013-04-21 VITALS — BP 132/82 | HR 71 | Temp 98.2°F | Resp 18 | Ht 65.0 in | Wt 264.0 lb

## 2013-04-21 DIAGNOSIS — R825 Elevated urine levels of drugs, medicaments and biological substances: Secondary | ICD-10-CM

## 2013-04-21 DIAGNOSIS — F411 Generalized anxiety disorder: Secondary | ICD-10-CM

## 2013-04-21 DIAGNOSIS — IMO0001 Reserved for inherently not codable concepts without codable children: Secondary | ICD-10-CM

## 2013-04-21 DIAGNOSIS — F3132 Bipolar disorder, current episode depressed, moderate: Secondary | ICD-10-CM

## 2013-04-21 DIAGNOSIS — R892 Abnormal level of other drugs, medicaments and biological substances in specimens from other organs, systems and tissues: Secondary | ICD-10-CM

## 2013-04-21 DIAGNOSIS — F419 Anxiety disorder, unspecified: Secondary | ICD-10-CM

## 2013-04-21 MED ORDER — SIMVASTATIN 40 MG PO TABS: 40.0000 mg | ORAL_TABLET | Freq: Every day | ORAL | Status: DC

## 2013-04-21 MED ORDER — GLIPIZIDE ER 5 MG PO TB24: 5.0000 mg | ORAL_TABLET | Freq: Every day | ORAL | Status: DC
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
## 2013-04-27 ENCOUNTER — Telehealth: Payer: Self-pay | Admitting: Family Medicine

## 2013-04-27 MED ORDER — HYDROCODONE-ACETAMINOPHEN 5-325 MG PO TABS: ORAL_TABLET | ORAL | Status: DC

## 2013-04-27 NOTE — Telephone Encounter (Signed)
UDS came back normal/negative. I'll print out a vicodin rx.

## 2013-04-27 NOTE — Telephone Encounter (Signed)
Patient called requesting results of UDS and a refill on vicodin.   Patient last seen 04/21/13.  Last rx was 8/25 x 1 refill.  Please advise.

## 2013-04-28 NOTE — Telephone Encounter (Signed)
Lmom for patient stating test was negative and rx is ready for pick up at front desk.

## 2013-05-01 ENCOUNTER — Other Ambulatory Visit: Payer: Self-pay | Admitting: Family Medicine

## 2013-05-02 DIAGNOSIS — R825 Elevated urine levels of drugs, medicaments and biological substances: Secondary | ICD-10-CM | POA: Insufficient documentation

## 2013-05-02 NOTE — Assessment & Plan Note (Addendum)
Continue ativan 0.5mg  bid prn, buspar 5mg  1/2 tab tid, and prozac 40mg  qd.

## 2013-05-02 NOTE — Assessment & Plan Note (Addendum)
Intolerant of victoza (nausea). Add glipizide XL 5mg  qd back on top of stricter diet, increased activity. Continue janumet 50/1000, 1 bid.

## 2013-05-02 NOTE — Assessment & Plan Note (Signed)
Improved.   We'll continue at lamictal dosing of 125mg  qd. Continue 40mg  qd prozac.

## 2013-05-02 NOTE — Telephone Encounter (Signed)
Patient requesting ativan refill.  Patient last seen 04/21/13.   Last rx was 12/21/12 x 3 refills.  Please advise.

## 2013-05-02 NOTE — Assessment & Plan Note (Signed)
Unclear why + for ETOH. She adamantly denies that she drinks alcohol.   Will repeat UDS today.

## 2013-05-09 ENCOUNTER — Encounter: Payer: Self-pay | Admitting: Family Medicine

## 2013-05-09 ENCOUNTER — Ambulatory Visit (INDEPENDENT_AMBULATORY_CARE_PROVIDER_SITE_OTHER): Payer: 59 | Admitting: Family Medicine

## 2013-05-09 VITALS — BP 149/82 | HR 73 | Temp 98.0°F | Resp 18 | Ht 65.0 in | Wt 259.0 lb

## 2013-05-09 DIAGNOSIS — J029 Acute pharyngitis, unspecified: Secondary | ICD-10-CM
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
## 2013-05-12 ENCOUNTER — Other Ambulatory Visit: Payer: Self-pay | Admitting: Family Medicine

## 2013-05-13 NOTE — Telephone Encounter (Signed)
Refill request for Prozac Last filled by MD on - 10/29/12 #30 x5 Last Appt- 05/09/13 Next Appt- 05/21/13 Please advise refill?

## 2013-05-16 ENCOUNTER — Encounter: Payer: Self-pay | Admitting: Family Medicine

## 2013-06-01 ENCOUNTER — Encounter: Payer: Self-pay | Admitting: Family Medicine

## 2013-06-06 ENCOUNTER — Encounter: Payer: Self-pay | Admitting: Family Medicine

## 2013-06-06 ENCOUNTER — Ambulatory Visit (INDEPENDENT_AMBULATORY_CARE_PROVIDER_SITE_OTHER): Payer: 59 | Admitting: Family Medicine

## 2013-06-06 VITALS — BP 151/86 | HR 66 | Temp 98.0°F | Ht 65.0 in | Wt 260.8 lb

## 2013-06-06 DIAGNOSIS — M25569 Pain in unspecified knee: Secondary | ICD-10-CM

## 2013-06-06 DIAGNOSIS — I10 Essential (primary) hypertension: Secondary | ICD-10-CM

## 2013-06-06 DIAGNOSIS — M222X1 Patellofemoral disorders, right knee: Secondary | ICD-10-CM

## 2013-06-06 DIAGNOSIS — F3132 Bipolar disorder, current episode depressed, moderate: Secondary | ICD-10-CM

## 2013-06-06 DIAGNOSIS — IMO0001 Reserved for inherently not codable concepts without codable children: Secondary | ICD-10-CM

## 2013-06-06 MED ORDER — GLIPIZIDE ER 10 MG PO TB24: 10.0000 mg | ORAL_TABLET | Freq: Every day | ORAL | Status: DC

## 2013-06-06 MED ORDER — HYDROCODONE-ACETAMINOPHEN 5-325 MG PO TABS: ORAL_TABLET | ORAL | Status: DC

## 2013-06-06 MED ORDER — METOPROLOL TARTRATE 25 MG PO TABS: ORAL_TABLET | ORAL | Status: DC

## 2013-06-06 MED ORDER — METFORMIN HCL 1000 MG PO TABS: ORAL_TABLET | ORAL | Status: DC

## 2013-06-06 MED ORDER — PIOGLITAZONE HCL 15 MG PO TABS: 15.0000 mg | ORAL_TABLET | Freq: Every day | ORAL | Status: DC

## 2013-06-06 MED ORDER — LAMOTRIGINE 150 MG PO TABS: 150.0000 mg | ORAL_TABLET | Freq: Every day | ORAL | Status: DC

## 2013-06-06 MED ORDER — LISINOPRIL 40 MG PO TABS: 40.0000 mg | ORAL_TABLET | Freq: Every day | ORAL | Status: DC

## 2013-06-06 NOTE — Patient Instructions (Signed)
Start metformin when you run out of your janumet. You may start the actos (pioglitazone) now.
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
## 2013-06-06 NOTE — Progress Notes (Signed)
Pre-visit discussion using our clinic review tool. No additional management support is needed unless otherwise documented below in the visit note.  

## 2013-07-13 ENCOUNTER — Telehealth: Payer: Self-pay | Admitting: Family Medicine

## 2013-07-13 MED ORDER — HYDROCODONE-ACETAMINOPHEN 5-325 MG PO TABS: ORAL_TABLET | ORAL | Status: DC

## 2013-07-13 NOTE — Telephone Encounter (Signed)
Vicodin rx printed. 

## 2013-07-13 NOTE — Telephone Encounter (Signed)
Patient requesting vicodin refill.  Patient last seen 06/06/13 and her las rx was printed 06/06/13.  Please advise refill.

## 2013-07-13 NOTE — Telephone Encounter (Signed)
LMOM stating rx is ready for pick up.

## 2013-07-22 ENCOUNTER — Ambulatory Visit: Payer: 59 | Admitting: Family Medicine

## 2013-08-22 ENCOUNTER — Other Ambulatory Visit: Payer: Self-pay | Admitting: Family Medicine

## 2013-08-22 MED ORDER — BUSPIRONE HCL 5 MG PO TABS: ORAL_TABLET | ORAL | Status: DC

## 2013-08-30 ENCOUNTER — Telehealth: Payer: Self-pay | Admitting: Family Medicine

## 2013-08-30 NOTE — Telephone Encounter (Signed)
Patient requesting refill of her vicodin.  Last OV was 06/06/13.  Last Rx was 07/13/13.   Patient also requesting samples of actos.  She states that they cost her around $200 for her rx.  We don't have any samples, anything else you could rx?

## 2013-08-31 MED ORDER — HYDROCODONE-ACETAMINOPHEN 5-325 MG PO TABS: ORAL_TABLET | ORAL | Status: DC

## 2013-08-31 NOTE — Telephone Encounter (Signed)
Vicodin rx printed. We don't have actos samples. We can discuss further options for diabetes treatment at next f/u visit that is already set for next month.-thx

## 2013-08-31 NOTE — Telephone Encounter (Signed)
LMOM for pt to pick up Rx at front desk.  Also stated that we do not carry actos samples and if she wanted to change her medication she would need to discuss with Dr. Milinda CaveMcGowen at next OV.

## 2013-10-05 ENCOUNTER — Ambulatory Visit: Payer: 59 | Admitting: Family Medicine

## 2013-10-21 ENCOUNTER — Encounter: Payer: Self-pay | Admitting: Family Medicine

## 2013-10-21 ENCOUNTER — Ambulatory Visit (INDEPENDENT_AMBULATORY_CARE_PROVIDER_SITE_OTHER): Payer: 59 | Admitting: Family Medicine

## 2013-10-21 VITALS — BP 144/79 | HR 79 | Temp 98.4°F | Resp 18 | Ht 65.0 in | Wt 274.0 lb

## 2013-10-21 DIAGNOSIS — I10 Essential (primary) hypertension: Secondary | ICD-10-CM

## 2013-10-21 DIAGNOSIS — G44229 Chronic tension-type headache, not intractable: Secondary | ICD-10-CM

## 2013-10-21 DIAGNOSIS — E1165 Type 2 diabetes mellitus with hyperglycemia: Principal | ICD-10-CM

## 2013-10-21 DIAGNOSIS — IMO0001 Reserved for inherently not codable concepts without codable children: Secondary | ICD-10-CM

## 2013-10-21 DIAGNOSIS — E785 Hyperlipidemia, unspecified: Secondary | ICD-10-CM

## 2013-10-21 DIAGNOSIS — G2581 Restless legs syndrome: Secondary | ICD-10-CM

## 2013-10-21 DIAGNOSIS — Z1211 Encounter for screening for malignant neoplasm of colon: Secondary | ICD-10-CM

## 2013-10-21 LAB — COMPREHENSIVE METABOLIC PANEL
ALBUMIN: 3.8 g/dL (ref 3.5–5.2)
ALT: 15 U/L (ref 0–35)
AST: 11 U/L (ref 0–37)
Alkaline Phosphatase: 72 U/L (ref 39–117)
BUN: 9 mg/dL (ref 6–23)
CALCIUM: 8.7 mg/dL (ref 8.4–10.5)
CHLORIDE: 100 meq/L (ref 96–112)
CO2: 26 meq/L (ref 19–32)
Creat: 0.67 mg/dL (ref 0.50–1.10)
GLUCOSE: 286 mg/dL — AB (ref 70–99)
POTASSIUM: 4.2 meq/L (ref 3.5–5.3)
Sodium: 136 mEq/L (ref 135–145)
Total Bilirubin: 0.3 mg/dL (ref 0.2–1.2)
Total Protein: 6.8 g/dL (ref 6.0–8.3)

## 2013-10-21 LAB — HEMOGLOBIN A1C
Hgb A1c MFr Bld: 8.5 % — ABNORMAL HIGH (ref <5.7)
Mean Plasma Glucose: 197 mg/dL — ABNORMAL HIGH (ref <117)

## 2013-10-21 MED ORDER — BUTALBITAL-APAP-CAFFEINE 50-325-40 MG PO TABS: ORAL_TABLET | ORAL | Status: DC

## 2013-10-21 MED ORDER — HYDROCODONE-ACETAMINOPHEN 5-325 MG PO TABS: ORAL_TABLET | ORAL | Status: DC

## 2013-10-21 NOTE — Assessment & Plan Note (Signed)
Recheck Hba1c. If still around the level as last check, plan to represcribe generic actos 15mg , 1 qd, continue metformin and sulfonylurea. Foot exam normal today.

## 2013-10-21 NOTE — Progress Notes (Signed)
OFFICE NOTE  10/21/2013  CC:  Chief Complaint  Patient presents with  . Follow-up  . Medication Refill     HPI: Patient is [name] who is here for routine f/u. Tired all the time as per her usual. Gets avg 4-5 hrs sleep per night.  Likes her new job, now has [employer].  Taking 1000 mg tylenol bid, only occasional vicodin if pain not well controlled with the tylenol. Occasionally takes a fioricet for a bad tension HA.  About 1 mo of intermittent ache in toes of both feet, most of the time just at the end of the day, says she thinks it is mainly 2nd and 3rd toes but not fully sure.  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  No weakness. No new footware, no change in routine as far as mobility/activity lately.  No color change.  Wears wide toed footwear.  Not checking sugars or bps lately. Not able to afford actos when she had no insurance so she never took this med, also never took victoza more than a few days b/c she felt like it made her feel sick.  Pertinent PMH:  Past medical, surgical, social, and family history reviewed and no changes are noted since last office visit.  MEDS:  Outpatient Prescriptions Prior to Visit  Medication Sig Dispense Refill  . aspirin 81 MG tablet Take 81 mg by mouth daily.      . busPIRone (BUSPAR) 5 MG tablet TAKE 1/2 TABLET BY MOUTH THREE TIMES A DAY  90 tablet  1  . ferrous sulfate 325 (65 FE) MG tablet Take 325 mg by mouth daily with breakfast.      . FLUoxetine (PROZAC) 40 MG capsule TAKE ONE CAPSULE BY MOUTH ONCE DAILY  30 capsule  6  . glipiZIDE (GLUCOTROL XL) 10 MG 24 hr tablet Take 1 tablet (10 mg total) by mouth daily with breakfast.  90 tablet  1  . lamoTRIgine (LAMICTAL) 150 MG tablet Take 1 tablet (150 mg total) by mouth daily.  90 tablet  1  . lisinopril (PRINIVIL,ZESTRIL) 40 MG tablet Take 1 tablet (40 mg total) by mouth daily.   90 tablet  1  . LORazepam (ATIVAN) 0.5 MG tablet take 1 tablet by mouth twice a day if needed  60 tablet  5  . metFORMIN (GLUCOPHAGE) 1000 MG tablet 1 tab po bid  180 tablet  1  . metoprolol tartrate (LOPRESSOR) 25 MG tablet take 1 tablet by mouth twice a day  180 tablet  1  . Multiple Vitamin (MULTIVITAMIN) tablet Take 1 tablet by mouth daily.      . Omega-3 Fatty Acids (FISH OIL) 1000 MG CAPS Take 1 capsule by mouth at bedtime.      Marland Kitchen. omeprazole (PRILOSEC) 40 MG capsule Take 1 capsule (40 mg total) by mouth daily.  30 capsule  6  . simvastatin (ZOCOR) 40 MG tablet Take 1 tablet (40 mg total) by mouth at bedtime.  30 tablet  6  . vitamin E 400 UNIT capsule Take 400 Units by mouth daily.      . butalbital-acetaminophen-caffeine (FIORICET, ESGIC) 50-325-40 MG per tablet take 1 to 2 tablets by mouth every 6 hours if needed for headache  30 tablet  1  . HYDROcodone-acetaminophen (NORCO/VICODIN) 5-325 MG per tablet TAKE 1 TO 2 TABLETS EVERY 6 HOURS AS NEEDED FOR PAIN  120 tablet  0  . Insulin Pen Needle 32G X 8 MM MISC Use as directed for victoza.  4 each  6  . pioglitazone (ACTOS) 15 MG tablet Take 1 tablet (15 mg total) by mouth daily.  90 tablet  1   No facility-administered medications prior to visit.    PE: Blood pressure 144/79, pulse 79, temperature 98.4 F (36.9 C), temperature source Temporal, resp. rate 18, height 5\' 5"  (1.651 m), weight 274 lb (124.286 kg), SpO2 96.00%. Gen: Alert, well appearing.  Patient is oriented to person, place, time, and situation. Foot exam - both normal; no swelling, tenderness or skin or vascular lesions. Color and temperature is normal. Sensation is intact. Peripheral pulses are palpable. Toenails are normal.  LAB: none today  IMPRESSION AND PLAN:  Type II or unspecified type diabetes mellitus without mention of complication, uncontrolled Recheck Hba1c. If still around the level as last check, plan to represcribe generic actos 15mg , 1 qd, continue  metformin and sulfonylurea. Foot exam normal today.   Restless legs syndrome The current medical regimen is effective;  continue present plan and medications.   Chronic tension headaches Continue minimal use of fioricet, new rx given today, #60, RF x 1.  HTN (hypertension), benign The current medical regimen is effective;  continue present plan and medications.   Colon cancer screening She continues to defer colonoscopy for now. Pt agreed to iFOB today.  An After Visit Summary was printed and given to the patient.  FOLLOW UP: 28mo, at which time we need to discuss routine f/u for her hx of multinodular goiter.

## 2013-10-21 NOTE — Assessment & Plan Note (Signed)
Continue minimal use of fioricet, new rx given today, #60, RF x 1.

## 2013-10-21 NOTE — Assessment & Plan Note (Signed)
She continues to defer colonoscopy for now. Pt agreed to iFOB today.

## 2013-10-21 NOTE — Progress Notes (Signed)
Pre visit review using our clinic review tool, if applicable. No additional management support is needed unless otherwise documented below in the visit note. 

## 2013-10-21 NOTE — Assessment & Plan Note (Signed)
The current medical regimen is effective;  continue present plan and medications.  

## 2013-10-25 ENCOUNTER — Other Ambulatory Visit: Payer: Self-pay | Admitting: Family Medicine

## 2013-10-25 ENCOUNTER — Encounter: Payer: Self-pay | Admitting: Family Medicine

## 2013-10-25 MED ORDER — PIOGLITAZONE HCL 30 MG PO TABS: 30.0000 mg | ORAL_TABLET | Freq: Every day | ORAL | Status: DC

## 2013-10-25 MED ORDER — PIOGLITAZONE HCL 15 MG PO TABS
15.0000 mg | ORAL_TABLET | Freq: Every day | ORAL | Status: DC
Start: 2013-10-25 — End: 2013-10-25

## 2013-11-10 ENCOUNTER — Telehealth: Payer: Self-pay

## 2013-11-10 MED ORDER — LORAZEPAM 0.5 MG PO TABS: ORAL_TABLET | ORAL | Status: DC

## 2013-11-10 NOTE — Telephone Encounter (Signed)
Rx for lorazepam printed.

## 2013-11-10 NOTE — Telephone Encounter (Signed)
Rx faxed to [facility]. 

## 2013-11-10 NOTE — Telephone Encounter (Signed)
[facility] requesting refill on Lorazepam 0.5 #60. Last refill on 05/01/2013. Last OV 10/21/2013

## 2013-11-21 ENCOUNTER — Telehealth: Payer: Self-pay

## 2013-11-21 NOTE — Telephone Encounter (Signed)
Relevant patient education assigned to patient using Emmi. ° °

## 2013-12-07 ENCOUNTER — Telehealth: Payer: Self-pay | Admitting: Family Medicine

## 2013-12-07 MED ORDER — HYDROCODONE-ACETAMINOPHEN 5-325 MG PO TABS: ORAL_TABLET | ORAL | Status: DC

## 2013-12-07 NOTE — Telephone Encounter (Signed)
Pt called requesting rf of hydrocodone.  Last OV was 10/21/13.  Rx last printed 10/21/13.  Please advise RX rf.

## 2013-12-07 NOTE — Telephone Encounter (Signed)
Pt aware that she can come pick up Rx on 12/08/13.

## 2013-12-07 NOTE — Telephone Encounter (Signed)
Hydrocodone rx printed. 

## 2013-12-30 ENCOUNTER — Other Ambulatory Visit: Payer: Self-pay | Admitting: Family Medicine

## 2013-12-30 MED ORDER — GLIPIZIDE ER 10 MG PO TB24: 10.0000 mg | ORAL_TABLET | Freq: Every day | ORAL | Status: DC

## 2013-12-30 MED ORDER — SIMVASTATIN 40 MG PO TABS: 40.0000 mg | ORAL_TABLET | Freq: Every day | ORAL | Status: DC

## 2014-01-02 ENCOUNTER — Other Ambulatory Visit: Payer: Self-pay | Admitting: Family Medicine

## 2014-01-02 MED ORDER — METFORMIN HCL 1000 MG PO TABS: ORAL_TABLET | ORAL | Status: DC

## 2014-01-02 MED ORDER — FLUOXETINE HCL 40 MG PO CAPS: ORAL_CAPSULE | ORAL | Status: DC

## 2014-01-13 ENCOUNTER — Other Ambulatory Visit: Payer: Self-pay | Admitting: Family Medicine

## 2014-01-13 ENCOUNTER — Other Ambulatory Visit: Payer: Self-pay | Admitting: *Deleted

## 2014-01-13 MED ORDER — METOPROLOL TARTRATE 25 MG PO TABS: ORAL_TABLET | ORAL | Status: DC

## 2014-01-13 MED ORDER — BUSPIRONE HCL 5 MG PO TABS: ORAL_TABLET | ORAL | Status: DC

## 2014-01-13 MED ORDER — LAMOTRIGINE 150 MG PO TABS: 150.0000 mg | ORAL_TABLET | Freq: Every day | ORAL | Status: DC

## 2014-02-09 ENCOUNTER — Telehealth: Payer: Self-pay | Admitting: Family Medicine

## 2014-02-09 MED ORDER — HYDROCODONE-ACETAMINOPHEN 5-325 MG PO TABS: ORAL_TABLET | ORAL | Status: DC

## 2014-02-09 NOTE — Telephone Encounter (Signed)
Pt LMOM requesting hydrocodone rf.  Patient las OV was 10/21/13.  Last Rx printed 12/07/13.  Please advise.

## 2014-02-09 NOTE — Telephone Encounter (Signed)
Rx ready for p/u.  Patient aware.

## 2014-02-09 NOTE — Telephone Encounter (Signed)
Vicodin rx printed as per pt request. She has f/u 02/23/14.-thx

## 2014-02-23 ENCOUNTER — Ambulatory Visit: Payer: Self-pay | Admitting: Family Medicine

## 2014-02-28 ENCOUNTER — Other Ambulatory Visit: Payer: Self-pay | Admitting: Family Medicine

## 2014-02-28 MED ORDER — OMEPRAZOLE 40 MG PO CPDR: 40.0000 mg | DELAYED_RELEASE_CAPSULE | Freq: Every day | ORAL | Status: DC

## 2014-03-02 ENCOUNTER — Ambulatory Visit: Payer: 59 | Admitting: Family Medicine

## 2014-03-06 ENCOUNTER — Encounter: Payer: Self-pay | Admitting: Family Medicine

## 2014-03-06 ENCOUNTER — Ambulatory Visit (INDEPENDENT_AMBULATORY_CARE_PROVIDER_SITE_OTHER): Payer: PRIVATE HEALTH INSURANCE | Admitting: Family Medicine

## 2014-03-06 VITALS — BP 127/81 | HR 67 | Temp 97.5°F | Resp 18 | Ht 65.0 in | Wt 281.0 lb

## 2014-03-06 DIAGNOSIS — R5382 Chronic fatigue, unspecified: Secondary | ICD-10-CM

## 2014-03-06 DIAGNOSIS — R5383 Other fatigue: Secondary | ICD-10-CM

## 2014-03-06 DIAGNOSIS — I10 Essential (primary) hypertension: Secondary | ICD-10-CM

## 2014-03-06 DIAGNOSIS — E1165 Type 2 diabetes mellitus with hyperglycemia: Principal | ICD-10-CM

## 2014-03-06 DIAGNOSIS — E785 Hyperlipidemia, unspecified: Secondary | ICD-10-CM

## 2014-03-06 DIAGNOSIS — R748 Abnormal levels of other serum enzymes: Secondary | ICD-10-CM

## 2014-03-06 DIAGNOSIS — IMO0001 Reserved for inherently not codable concepts without codable children: Secondary | ICD-10-CM

## 2014-03-06 DIAGNOSIS — R5381 Other malaise: Secondary | ICD-10-CM

## 2014-03-06 DIAGNOSIS — E669 Obesity, unspecified: Secondary | ICD-10-CM

## 2014-03-06 LAB — COMPREHENSIVE METABOLIC PANEL
ALT: 25 U/L (ref 0–35)
AST: 20 U/L (ref 0–37)
Albumin: 3.6 g/dL (ref 3.5–5.2)
Alkaline Phosphatase: 73 U/L (ref 39–117)
BUN: 17 mg/dL (ref 6–23)
CO2: 27 meq/L (ref 19–32)
CREATININE: 0.5 mg/dL (ref 0.4–1.2)
Calcium: 8.8 mg/dL (ref 8.4–10.5)
Chloride: 104 mEq/L (ref 96–112)
GFR: 125.23 mL/min (ref >60.00)
Glucose, Bld: 185 mg/dL — ABNORMAL HIGH (ref 70–99)
Potassium: 4.1 mEq/L (ref 3.5–5.1)
Sodium: 139 mEq/L (ref 135–145)
Total Bilirubin: 0.4 mg/dL (ref 0.2–1.2)
Total Protein: 6.9 g/dL (ref 6.0–8.3)

## 2014-03-06 LAB — LIPID PANEL
CHOLESTEROL: 181 mg/dL (ref 0–200)
HDL: 36.8 mg/dL — ABNORMAL LOW (ref >39.00)
NonHDL: 144.2
Total CHOL/HDL Ratio: 5
Triglycerides: 261 mg/dL — ABNORMAL HIGH (ref 0.0–149.0)
VLDL: 52.2 mg/dL — ABNORMAL HIGH (ref 0.0–40.0)

## 2014-03-06 LAB — CBC WITH DIFFERENTIAL/PLATELET
BASOS ABS: 0 10*3/uL (ref 0.0–0.1)
Basophils Relative: 0.4 % (ref 0.0–3.0)
Eosinophils Absolute: 0.1 10*3/uL (ref 0.0–0.7)
Eosinophils Relative: 2.6 % (ref 0.0–5.0)
HCT: 36.5 % (ref 36.0–46.0)
Hemoglobin: 12.1 g/dL (ref 12.0–15.0)
Lymphocytes Relative: 34.2 % (ref 12.0–46.0)
Lymphs Abs: 1.8 10*3/uL (ref 0.7–4.0)
MCHC: 33.3 g/dL (ref 30.0–36.0)
MCV: 94.7 fl (ref 78.0–100.0)
MONO ABS: 0.3 10*3/uL (ref 0.1–1.0)
MONOS PCT: 5.5 % (ref 3.0–12.0)
NEUTROS PCT: 57.3 % (ref 43.0–77.0)
Neutro Abs: 3 10*3/uL (ref 1.4–7.7)
PLATELETS: 203 10*3/uL (ref 150.0–400.0)
RBC: 3.85 Mil/uL — ABNORMAL LOW (ref 3.87–5.11)
RDW: 14.3 % (ref 11.5–15.5)
WBC: 5.2 10*3/uL (ref 4.0–10.5)

## 2014-03-06 LAB — LIPASE: LIPASE: 24 U/L (ref 11.0–59.0)

## 2014-03-06 LAB — HEMOGLOBIN A1C: Hgb A1c MFr Bld: 8 % — ABNORMAL HIGH (ref 4.6–6.5)

## 2014-03-06 LAB — TSH: TSH: 1.86 u[IU]/mL (ref 0.35–4.50)

## 2014-03-06 NOTE — Progress Notes (Signed)
Pre visit review using our clinic review tool, if applicable. No additional management support is needed unless otherwise documented below in the visit note. 

## 2014-03-06 NOTE — Progress Notes (Signed)
OFFICE NOTE  03/06/2014  CC:  Chief Complaint  Patient presents with  . Follow-up     HPI: Patient is [name] who is here for 4 mo routine f/u DM 2, HTN, chronic LBP. Not monitoring glucoses or bp at all.  She takes her meds faithfully.   She is overdue for GYN follow up. She did not do the iFOB test kit I gave her last visit for colon cancer screening.    She is not paying attention to her diet well, does not exercise regularly. She is agreeable to nutritionist consult to get back on track with diabetic/heart healthy diet, wants to lose some wt.  ROS: having some HA's, menses becoming irregular/coming less often, feels tired all the time.  Not exercising any.  Pertinent PMH:  Past medical, surgical, social, and family history reviewed and no changes are noted since last office visit.  MEDS:  Outpatient Prescriptions Prior to Visit  Medication Sig Dispense Refill  . aspirin 81 MG tablet Take 81 mg by mouth daily.      . busPIRone (BUSPAR) 5 MG tablet TAKE 1/2 TABLET BY MOUTH THREE TIMES A DAY  90 tablet  1  . butalbital-acetaminophen-caffeine (FIORICET, ESGIC) 50-325-40 MG per tablet 1-2 tabs po bid prn HA  60 tablet  1  . ferrous sulfate 325 (65 FE) MG tablet Take 325 mg by mouth daily with breakfast.      . FLUoxetine (PROZAC) 40 MG capsule TAKE ONE CAPSULE BY MOUTH ONCE DAILY  90 capsule  1  . glipiZIDE (GLUCOTROL XL) 10 MG 24 hr tablet Take 1 tablet (10 mg total) by mouth daily with breakfast.  90 tablet  1  . HYDROcodone-acetaminophen (NORCO/VICODIN) 5-325 MG per tablet TAKE 1 TO 2 TABLETS EVERY 6 HOURS AS NEEDED FOR PAIN  120 tablet  0  . lamoTRIgine (LAMICTAL) 150 MG tablet Take 1 tablet (150 mg total) by mouth daily.  90 tablet  1  . lisinopril (PRINIVIL,ZESTRIL) 40 MG tablet Take 1 tablet (40 mg total) by mouth daily.  90 tablet  1  . LORazepam (ATIVAN) 0.5 MG tablet take 1 tablet by mouth twice a day if needed  60 tablet  5  . metFORMIN (GLUCOPHAGE)  1000 MG tablet 1 tab po bid  180 tablet  1  . metoprolol tartrate (LOPRESSOR) 25 MG tablet take 1 tablet by mouth twice a day  180 tablet  1  . Multiple Vitamin (MULTIVITAMIN) tablet Take 1 tablet by mouth daily.      . Omega-3 Fatty Acids (FISH OIL) 1000 MG CAPS Take 1 capsule by mouth at bedtime.      Marland Kitchen omeprazole (PRILOSEC) 40 MG capsule Take 1 capsule (40 mg total) by mouth daily.  30 capsule  3  . pioglitazone (ACTOS) 30 MG tablet Take 1 tablet (30 mg total) by mouth daily.  30 tablet  3  . simvastatin (ZOCOR) 40 MG tablet Take 1 tablet (40 mg total) by mouth at bedtime.  90 tablet  1  . vitamin E 400 UNIT capsule Take 400 Units by mouth daily.      . Insulin Pen Needle 32G X 8 MM MISC Use as directed for victoza.  4 each  6   No facility-administered medications prior to visit.    PE: Blood pressure 127/81, pulse 67, temperature 97.5 F (36.4 C), temperature source Temporal, resp. rate 18, height 5' 5"  (1.651 m), weight 281 lb (127.461 kg), SpO2 98.00%. Gen: Alert, well  appearing.  Patient is oriented to person, place, time, and situation. AFFECT: pleasant, lucid thought and speech. No further exam today.  IMPRESSION AND PLAN:  1) DM 2, hx of poor control. Recheck HbA1c today.  FLP today.  2) HTN: needs to monitor more at home or work. No change in meds: bp good today. Check lytes/cr today.  3) Chronic fatigue/excessive somnolence/question of OSA: at next f/u in 1 mo will re-refer pt to pulm for consideration of sleep study to further eval for OSA (she did not go to pulm when referred there originally for this problem in 2013). Check CBC (hx of IDA).  4) Hx of thyroid goiter: will recheck this after next visit with ultrasound (X years; has had multiple stable u/s's and multiple benign biopsies.  +FH of follicular thyroid cancer-mom).  TSH today.   5) Make appt with GYN for routine cervical cancer and breast cancer screening: pt expressed understanding of the need for this and  said she'll do it.  6) Colon cancer screening: at next visit I'll give her iFOB again and see if she can complete it since she neglected to do so last visit.  An After Visit Summary was printed and given to the patient.  FOLLOW UP: 1 mo f/u somnolence/thyroid/col ca scr

## 2014-03-07 ENCOUNTER — Other Ambulatory Visit: Payer: Self-pay | Admitting: Family Medicine

## 2014-03-07 LAB — LDL CHOLESTEROL, DIRECT: Direct LDL: 124.1 mg/dL

## 2014-03-07 MED ORDER — GLIPIZIDE ER 5 MG PO TB24: ORAL_TABLET | ORAL | Status: DC

## 2014-03-07 MED ORDER — ATORVASTATIN CALCIUM 40 MG PO TABS: 40.0000 mg | ORAL_TABLET | Freq: Every day | ORAL | Status: DC

## 2014-03-07 MED ORDER — PIOGLITAZONE HCL 45 MG PO TABS: 45.0000 mg | ORAL_TABLET | Freq: Every day | ORAL | Status: DC

## 2014-03-22 ENCOUNTER — Other Ambulatory Visit: Payer: Self-pay | Admitting: Family Medicine

## 2014-03-22 ENCOUNTER — Encounter: Payer: PRIVATE HEALTH INSURANCE | Attending: Family Medicine | Admitting: Nutrition

## 2014-03-22 VITALS — Ht 65.0 in | Wt 286.0 lb

## 2014-03-22 DIAGNOSIS — Z794 Long term (current) use of insulin: Secondary | ICD-10-CM | POA: Insufficient documentation

## 2014-03-22 DIAGNOSIS — IMO0002 Reserved for concepts with insufficient information to code with codable children: Secondary | ICD-10-CM

## 2014-03-22 DIAGNOSIS — E118 Type 2 diabetes mellitus with unspecified complications: Secondary | ICD-10-CM | POA: Diagnosis not present

## 2014-03-22 DIAGNOSIS — E1165 Type 2 diabetes mellitus with hyperglycemia: Secondary | ICD-10-CM

## 2014-03-22 DIAGNOSIS — Z713 Dietary counseling and surveillance: Secondary | ICD-10-CM | POA: Diagnosis not present

## 2014-03-22 MED ORDER — ACCU-CHEK AVIVA CONNECT W/DEVICE KIT: 1.0000 | PACK | Freq: Two times a day (BID) | Status: DC

## 2014-03-22 NOTE — Patient Instructions (Signed)
Plan:  Aim for 2-3 carb Choices per meal (30-45  grams) +/- 1 either way  Aim for 0-1 Carbs per snack if hungry  Include protein in moderation with your meals and snacks Consider  increasing your activity level by 15 for 30  minutes daily as tolerated Consider checking BG in the mornings and 2 hours after largest meal of day a few times per week. Consider taking medication  as directed by MD Contact MD to request him to order a meter and testing supplies.  Goal: Begin walking 30 minutes 4-5 times per week. 2. Get A1C down to 7% in three months.3. Lose 1-2 lbs of weight per week.

## 2014-03-22 NOTE — Progress Notes (Signed)
  Medical Nutrition Therapy:  Appt start time: 0800 end time:  900.   Assessment:  Primary concerns today: diabetes. Lives by herself. Has 2 kids that don't live locally. She does her own shopping and doesn't cook much . Usually eats microwave TV dinners or eats out. States she craves sweets. Eats out fast food. 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 Doesn't get any physical activity outside of her job. Not checking blood sugar. Has a meter-generic. Willing to start checking blood sugars.  Preferred Learning Style:    No preference indicated   Learning Readiness:    Not ready  Contemplating  Ready  Change in progress   MEDICATIONS: SEE LIST   DIETARY INTAKE:  24-hr recall:  B ( AM): Sometimes skips or does [employer] of grits, eggs, bacon, biscuit, 2 cartons of whole milk  Snk ( AM): none  L ( PM): Usually eats whatever they have at work. Had chicken and dumplins yesterday. Sometimes eats salad. Diet cola or unsweet tea Snk ( PM): sometimes eats popcorn, diet soda D ( PM): Skipped; Had a milkshake 16-20 oz from [employer] Snk ( PM): none Beverages: water, diet soda or unsweet tea  Usual physical activity: none other than ADL's.  Estimated energy needs: 1500 calories 170 g carbohydrates 112 g protein 42 g fat  Progress Towards Goal(s):  In progress.   Nutritional Diagnosis:  NB-1.1 Food and nutrition-related knowledge deficit As related to diabetes.  As evidenced by A1C of 8%..    Intervention:  Nutrition counseling and diabetes. Plan:  Aim for 2-3 Carb Choices per meal ( 30-45grams) +/- 1 either way  Aim for 0-1 Carbs per snack if hungry  Include protein in moderation with your meals and snacks Consider reading food labels for Total Carbohydrate Consider  increasing your activity level by 15 for 30 minutes daily as tolerated Consider checking BG at alternate times per day as directed by MD  Consider taking  medications as directed by MD  Goal: Get A1C down to 7% or less in three months. 2. Lose 1 lbs per week. 3. Walk 30 minutes a day.  Teaching Method Utilized:  Visual Auditory Hands on  Handouts given during visit include:  My  Plate  Carb Counting and Food Label handouts  Meal Plan Card  Barriers to learning/adherence to lifestyle change: none  Demonstrated degree of understanding via:  Teach Back   Monitoring/Evaluation:  Dietary intake, exercise, , and body weight in 1 month(s).

## 2014-03-31 ENCOUNTER — Telehealth: Payer: Self-pay | Admitting: Family Medicine

## 2014-03-31 NOTE — Telephone Encounter (Signed)
Pt requesting rf of hydrocodone.  Patient last OV was 03/06/14.  Last RX was printed 02/09/14.  Please advise.

## 2014-04-01 MED ORDER — HYDROCODONE-ACETAMINOPHEN 5-325 MG PO TABS: ORAL_TABLET | ORAL | Status: DC

## 2014-04-01 NOTE — Telephone Encounter (Signed)
Vicodin rx printed. 

## 2014-04-03 NOTE — Telephone Encounter (Signed)
Patient aware rx is ready for p/u.  Pt's friend Jan will p/u.

## 2014-04-05 ENCOUNTER — Ambulatory Visit: Payer: PRIVATE HEALTH INSURANCE | Admitting: Nutrition

## 2014-04-05 ENCOUNTER — Encounter: Payer: PRIVATE HEALTH INSURANCE | Admitting: Nutrition

## 2014-04-07 ENCOUNTER — Encounter: Payer: PRIVATE HEALTH INSURANCE | Attending: Family Medicine | Admitting: Nutrition

## 2014-04-07 VITALS — Ht 65.0 in | Wt 276.0 lb

## 2014-04-07 DIAGNOSIS — Z713 Dietary counseling and surveillance: Secondary | ICD-10-CM | POA: Insufficient documentation

## 2014-04-07 DIAGNOSIS — E118 Type 2 diabetes mellitus with unspecified complications: Secondary | ICD-10-CM | POA: Insufficient documentation

## 2014-04-07 DIAGNOSIS — IMO0002 Reserved for concepts with insufficient information to code with codable children: Secondary | ICD-10-CM

## 2014-04-07 DIAGNOSIS — E1165 Type 2 diabetes mellitus with hyperglycemia: Secondary | ICD-10-CM

## 2014-04-07 NOTE — Progress Notes (Signed)
  Medical Nutrition Therapy:  Appt start time: 4:30 end time:  5 pm.   Assessment: Follow up DM  She has changed by trying to eat three meals per day. Hasn't gotten in the exercise yet but will work on that this weekend and [facility] [employer]. FBS 147-191 mg/dl. Before supper 113-153 mg,  2 hrs after supper 90's mg/dl. Craving sweets really bad at times, but it's getting better.. Drinking a lot of water and some milk sometimes and a few unsweet tea. Feels like she has a lot more energy and feels better. Lost 10 lbs of weight since last visit.   Keeping a food journal and BS log and brought it in.  Preferred Learning Style:    No preference indicated   Learning Readiness:    Not ready  Contemplating  Ready  Change in progress   MEDICATIONS: SEE LIST   DIETARY INTAKE:  24-hr recall:  B ( AM): 2 slices toast, 2 boiled eggs,  Snk ( AM): none  L ( PM): toss salad with veggies, grilled chicken,  Snk ( PM):  D ( PM): Hamburger, FF, apple slices and 1% milk. Snk ( PM): none Beverages: water, diet soda or unsweet tea  Usual physical activity: none other than ADL's.  Estimated energy needs: 1500 calories 170 g carbohydrates 112 g protein 42 g fat  Progress Towards Goal(s):  In progress.   Nutritional Diagnosis:  NB-1.1 Food and nutrition-related knowledge deficit As related to diabetes.  As evidenced by A1C of 8%..    Intervention:  Nutrition counseling and diabetes. Plan:  Aim for 2-3 Carb Choices per meal ( 30-45grams) +/- 1 either way  No snacks between meals.   Continue to drink water 5 bottles a day.  Goal: Get in 30 minutes of exercise three times per week.  Goal: Get A1C down to 7% or less in three months. 2. Lose 1 lbs per week. 3. Walk 30 minutes a day.  Teaching Method Utilized:  Visual Auditory Hands on  Handouts given during visit include:  My  Plate  Carb Counting and Food Label handouts  Meal Plan Card  Barriers to learning/adherence to  lifestyle change: none  Demonstrated degree of understanding via:  Teach Back   Monitoring/Evaluation:  Dietary intake, exercise, Meal planning and SBG and body weight in 1 month(s).

## 2014-04-11 ENCOUNTER — Ambulatory Visit: Payer: PRIVATE HEALTH INSURANCE | Admitting: Family Medicine

## 2014-04-12 NOTE — Patient Instructions (Signed)
Plan:  Aim for 2-3 Carb Choices per meal ( 30-45grams) +/- 1 either way  No snacks between meals.   Continue to drink water 5 bottles a day.  Goal: Get in 30 minutes of exercise three times per week.  Goal: Get A1C down to 7% or less in three months. 2. Lose 1 lbs per week. 3. Walk 30 minutes a day.

## 2014-04-18 ENCOUNTER — Encounter: Payer: Self-pay | Admitting: Family Medicine

## 2014-04-18 ENCOUNTER — Ambulatory Visit (INDEPENDENT_AMBULATORY_CARE_PROVIDER_SITE_OTHER): Payer: PRIVATE HEALTH INSURANCE | Admitting: Family Medicine

## 2014-04-18 VITALS — BP 131/84 | HR 69 | Temp 97.7°F | Resp 16 | Ht 65.0 in | Wt 272.0 lb

## 2014-04-18 DIAGNOSIS — Z1211 Encounter for screening for malignant neoplasm of colon: Secondary | ICD-10-CM

## 2014-04-18 DIAGNOSIS — E042 Nontoxic multinodular goiter: Secondary | ICD-10-CM

## 2014-04-18 DIAGNOSIS — G4710 Hypersomnia, unspecified: Secondary | ICD-10-CM

## 2014-04-18 DIAGNOSIS — E785 Hyperlipidemia, unspecified: Secondary | ICD-10-CM

## 2014-04-18 DIAGNOSIS — E041 Nontoxic single thyroid nodule: Secondary | ICD-10-CM

## 2014-04-18 DIAGNOSIS — E119 Type 2 diabetes mellitus without complications: Secondary | ICD-10-CM

## 2014-04-18 LAB — LIPID PANEL
CHOLESTEROL: 114 mg/dL (ref 0–200)
HDL: 34 mg/dL — ABNORMAL LOW (ref >39.00)
LDL Cholesterol: 49 mg/dL (ref 0–99)
NONHDL: 80
Total CHOL/HDL Ratio: 3
Triglycerides: 153 mg/dL — ABNORMAL HIGH (ref 0.0–149.0)
VLDL: 30.6 mg/dL (ref 0.0–40.0)

## 2014-04-18 MED ORDER — LISINOPRIL 40 MG PO TABS: 40.0000 mg | ORAL_TABLET | Freq: Every day | ORAL | Status: DC

## 2014-04-18 NOTE — Addendum Note (Signed)
Addended by: Eulah PontALBRIGHT, Aly Seidenberg M on: 04/18/2014 01:47 PM   Modules accepted: Orders

## 2014-04-18 NOTE — Progress Notes (Signed)
OFFICE NOTE  04/18/2014  CC:  Chief Complaint  Patient presents with  . Follow-up     HPI: Patient is [name] who is here for 6 wk f/u DM 2, hyperlipidemia.  Seeing a nutritionist. Glucoses: fastings <150, 2H PP 150-200. More achiness in feet and legs since switching from zocor to atorv recently but she says she wants to continue med and give this time.   Pertinent PMH:  Past medical, surgical, social, and family history reviewed and no changes noted since last office visit.  MEDS:  Outpatient Prescriptions Prior to Visit  Medication Sig Dispense Refill  . aspirin 81 MG tablet Take 81 mg by mouth daily.    Marland Kitchen atorvastatin (LIPITOR) 40 MG tablet Take 1 tablet (40 mg total) by mouth daily. 30 tablet 5  . busPIRone (BUSPAR) 5 MG tablet TAKE 1/2 TABLET BY MOUTH THREE TIMES A DAY 90 tablet 1  . butalbital-acetaminophen-caffeine (FIORICET, ESGIC) 50-325-40 MG per tablet 1-2 tabs po bid prn HA 60 tablet 1  . ferrous sulfate 325 (65 FE) MG tablet Take 325 mg by mouth daily with breakfast.    . FLUoxetine (PROZAC) 40 MG capsule TAKE ONE CAPSULE BY MOUTH ONCE DAILY 90 capsule 1  . glipiZIDE (GLUCOTROL XL) 5 MG 24 hr tablet 3 tabs po qAM 90 tablet 5  . HYDROcodone-acetaminophen (NORCO/VICODIN) 5-325 MG per tablet TAKE 1 TO 2 TABLETS EVERY 6 HOURS AS NEEDED FOR PAIN 120 tablet 0  . lamoTRIgine (LAMICTAL) 150 MG tablet Take 1 tablet (150 mg total) by mouth daily. 90 tablet 1  . lisinopril (PRINIVIL,ZESTRIL) 40 MG tablet Take 1 tablet (40 mg total) by mouth daily. 90 tablet 1  . LORazepam (ATIVAN) 0.5 MG tablet take 1 tablet by mouth twice a day if needed 60 tablet 5  . metFORMIN (GLUCOPHAGE) 1000 MG tablet 1 tab po bid 180 tablet 1  . metoprolol tartrate (LOPRESSOR) 25 MG tablet take 1 tablet by mouth twice a day 180 tablet 1  . Multiple Vitamin (MULTIVITAMIN) tablet Take 1 tablet by mouth daily.    . Omega-3 Fatty Acids (FISH OIL) 1000 MG CAPS Take 1 capsule by mouth at  bedtime.    Marland Kitchen omeprazole (PRILOSEC) 40 MG capsule Take 1 capsule (40 mg total) by mouth daily. 30 capsule 3  . pioglitazone (ACTOS) 45 MG tablet Take 1 tablet (45 mg total) by mouth daily. 30 tablet 5  . Blood Glucose Monitoring Suppl (ACCU-CHEK AVIVA CONNECT) W/DEVICE KIT 1 kit by Does not apply route 2 (two) times daily. 1 kit 0  . Insulin Pen Needle 32G X 8 MM MISC Use as directed for victoza. 4 each 6  . simvastatin (ZOCOR) 40 MG tablet Take 1 tablet (40 mg total) by mouth at bedtime. 90 tablet 1   No facility-administered medications prior to visit.    PE: Blood pressure 131/84, pulse 69, temperature 97.7 F (36.5 C), temperature source Temporal, resp. rate 16, height 5' 5"  (1.651 m), weight 272 lb (123.378 kg), SpO2 98 %. Gen: Alert, well appearing.  Patient is oriented to person, place, time, and situation. AFFECT: pleasant, lucid thought and speech. No further exam today.  IMPRESSION AND PLAN:  1) DM 2, control improving.  Continue current meds/diet. Recheck A1c in 2 mo.  2) Hyperlipidemia: recheck FLP today.  Continue atorv.  3) Hx of thyroid nodules, she is euthyroid.  Ordered f/u thyroid u/s today.  4) Colon cancer screening: she is agreeable to iFOB but declines colonoscopy.  5) Excessive somnolence disorder, hx of suspicion of OSA: we had planned to refer to pulm at this time but she wants to wait now and see how things go with wt loss.  An After Visit Summary was printed and given to the patient.  FOLLOW UP: 2 mo

## 2014-04-18 NOTE — Progress Notes (Signed)
Pre visit review using our clinic review tool, if applicable. No additional management support is needed unless otherwise documented below in the visit note. 

## 2014-04-18 NOTE — Addendum Note (Signed)
Addended by: Cydney OkAUGUSTIN, Helmuth Recupero N on: 04/18/2014 09:34 AM   Modules accepted: Orders

## 2014-04-20 ENCOUNTER — Ambulatory Visit
Admission: RE | Admit: 2014-04-20 | Discharge: 2014-04-20 | Disposition: A | Discharge status: Home / Self Care | Payer: 59 | Source: Ambulatory Visit | Attending: Family Medicine | Admitting: Family Medicine

## 2014-04-20 DIAGNOSIS — E041 Nontoxic single thyroid nodule: Secondary | ICD-10-CM

## 2014-04-21 ENCOUNTER — Encounter: Payer: Self-pay | Admitting: Family Medicine

## 2014-05-05 ENCOUNTER — Ambulatory Visit: Payer: PRIVATE HEALTH INSURANCE | Admitting: Nutrition

## 2014-05-10 ENCOUNTER — Other Ambulatory Visit: Payer: Self-pay

## 2014-05-10 DIAGNOSIS — Z1211 Encounter for screening for malignant neoplasm of colon: Secondary | ICD-10-CM

## 2014-05-10 NOTE — Addendum Note (Signed)
Addended by: Cydney OkAUGUSTIN, Carli Lefevers N on: 05/10/2014 10:37 AM   Modules accepted: Orders

## 2014-05-12 LAB — FECAL OCCULT BLOOD, IMMUNOCHEMICAL: FECAL OCCULT BLD: NEGATIVE

## 2014-05-16 ENCOUNTER — Other Ambulatory Visit: Payer: Self-pay | Admitting: Family Medicine

## 2014-05-16 ENCOUNTER — Telehealth: Payer: Self-pay | Admitting: Family Medicine

## 2014-05-16 DIAGNOSIS — E041 Nontoxic single thyroid nodule: Secondary | ICD-10-CM

## 2014-05-16 NOTE — Telephone Encounter (Signed)
Pls call pt and tell her that I want her to see a [facility] endocrinologist to get her thyroid biopsy done. I have entered a referral order for her to see Dr. [name], so she'll be seen in the office by the MD first and then they'll set up the biopsy.-thx

## 2014-05-18 NOTE — Telephone Encounter (Signed)
Mailbox full

## 2014-05-19 NOTE — Telephone Encounter (Signed)
Mailbox full

## 2014-05-23 ENCOUNTER — Encounter: Payer: Self-pay | Admitting: Family Medicine

## 2014-05-23 NOTE — Telephone Encounter (Signed)
Letter sent to patient to contact office.

## 2014-05-26 ENCOUNTER — Telehealth: Payer: Self-pay

## 2014-05-26 ENCOUNTER — Other Ambulatory Visit: Payer: Self-pay

## 2014-05-26 MED ORDER — LORAZEPAM 0.5 MG PO TABS: ORAL_TABLET | ORAL | Status: DC

## 2014-05-26 NOTE — Telephone Encounter (Signed)
Printed and faxed

## 2014-05-26 NOTE — Telephone Encounter (Signed)
[facility] requesting refill on Lorazepam 0.5mg . Last OV 04/18/2014. Last refill 05/10/2014

## 2014-05-26 NOTE — Telephone Encounter (Signed)
(  Last Rx for #60 was given in May 2015, with 5 RF.) Printed lorazepam rx today.

## 2014-05-30 ENCOUNTER — Telehealth: Payer: Self-pay | Admitting: Family Medicine

## 2014-05-30 MED ORDER — HYDROCODONE-ACETAMINOPHEN 5-325 MG PO TABS: ORAL_TABLET | ORAL | Status: DC

## 2014-05-30 NOTE — Telephone Encounter (Signed)
Pt LMOM requesting refill of her vicodin.  Last RX was printed 04/01/14.  Last OV was 04/18/14.  Please advise.

## 2014-05-30 NOTE — Telephone Encounter (Signed)
Vicodin rx printed. 

## 2014-05-30 NOTE — Telephone Encounter (Signed)
Patient aware Rx will be at front desk for p/u.

## 2014-06-12 ENCOUNTER — Encounter: Payer: Self-pay | Admitting: Internal Medicine

## 2014-06-12 ENCOUNTER — Other Ambulatory Visit: Payer: Self-pay | Admitting: *Deleted

## 2014-06-12 ENCOUNTER — Ambulatory Visit (INDEPENDENT_AMBULATORY_CARE_PROVIDER_SITE_OTHER): Payer: 59 | Admitting: Internal Medicine

## 2014-06-12 VITALS — BP 130/68 | HR 77 | Temp 97.6°F | Resp 12 | Ht 65.0 in | Wt 282.0 lb

## 2014-06-12 DIAGNOSIS — E042 Nontoxic multinodular goiter: Secondary | ICD-10-CM

## 2014-06-12 NOTE — Patient Instructions (Signed)
Please return in 1 year. You will be called with the appt in [facility].  Thyroid Biopsy The thyroid gland is a butterfly-shaped gland situated in the front of the neck. It produces hormones which affect metabolism, growth and development, and body temperature. A thyroid biopsy is a procedure in which small samples of tissue or fluid are removed from the thyroid gland or mass and examined under a microscope. This test is done to determine the cause of thyroid problems, such as infection, cancer, or other thyroid problems. There are 2 ways to obtain samples: 1. Fine needle biopsy. Samples are removed using a thin needle inserted through the skin and into the thyroid gland or mass. 2. Open biopsy. Samples are removed after a cut (incision) is made through the skin. LET YOUR CAREGIVER KNOW ABOUT:   Allergies.  Medications taken including herbs, eye drops, over-the-counter medications, and creams.  Use of steroids (by mouth or creams).  Previous problems with anesthetics or numbing medicine.  Possibility of pregnancy, if this applies.  History of blood clots (thrombophlebitis).  History of bleeding or blood problems.  Previous surgery.  Other health problems. RISKS AND COMPLICATIONS  Bleeding from the site. The risk of bleeding is higher if you have a bleeding disorder or are taking any blood thinning medications (anticoagulants).  Infection.  Injury to structures near the thyroid gland. BEFORE THE PROCEDURE  This is a procedure that can be done as an outpatient. Confirm the time that you need to arrive for your procedure. Confirm whether there is a need to fast or withhold any medications. A blood sample may be done to determine your blood clotting time. Medicine may be given to help you relax (sedative). PROCEDURE Fine needle biopsy. You will be awake during the procedure. You may be asked to lie on your back with your head tipped backward to extend your neck. Let your caregiver  know if you cannot tolerate the positioning. An area on your neck will be cleansed. A needle is inserted through the skin of your neck. You may feel a mild discomfort during this procedure. You may be asked to avoid coughing, talking, swallowing, or making sounds during some portions of the procedure. The needle is withdrawn once tissue or fluid samples have been removed. Pressure may be applied to the neck to reduce swelling and ensure that bleeding has stopped. The samples will be sent for examination.  Open biopsy. You will be given general anesthesia. You will be asleep during the procedure. An incision is made in your neck. A sample of thyroid tissue or the mass is removed. The tissue sample or mass will be sent for examination. The sample or mass may be examined during the biopsy. If the sample or mass contains cancer cells, some or all of the thyroid gland may be removed. The incision is closed with stitches. AFTER THE PROCEDURE  Your recovery will be assessed and monitored. If there are no problems, as an outpatient, you should be able to go home shortly after the procedure. If you had a fine needle biopsy:  You may have soreness at the biopsy site for 1 to 2 days. If you had an open biopsy:   You may have soreness at the biopsy site for 3 to 4 days.  You may have a hoarse voice or sore throat for 1 to 2 days. Obtaining the Test Results It is your responsibility to obtain your test results. Do not assume everything is normal if you have not  heard from your caregiver or the medical facility. It is important for you to follow up on all of your test results. HOME CARE INSTRUCTIONS   Keeping your head raised on a pillow when you are lying down may ease biopsy site discomfort.  Supporting the back of your head and neck with both hands as you sit up from a lying position may ease biopsy site discomfort.  Only take over-the-counter or prescription medicines for pain, discomfort, or fever as  directed by your caregiver.  Throat lozenges or gargling with warm salt water may help to soothe a sore throat. SEEK IMMEDIATE MEDICAL CARE IF:   You have severe bleeding from the biopsy site.  You have difficulty swallowing.  You have a fever.  You have increased pain, swelling, redness, or warmth at the biopsy site.  You notice pus coming from the biopsy site.  You have swollen glands (lymph nodes) in your neck. Document Released: 03/30/2007 Document Revised: 09/27/2012 Document Reviewed: 08/25/2013 [facility] Patient Information 2015 [employer], [place]. This information is not intended to replace advice given to you by your health care provider. Make sure you discuss any questions you have with your health care provider.
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
## 2014-06-19 ENCOUNTER — Ambulatory Visit (INDEPENDENT_AMBULATORY_CARE_PROVIDER_SITE_OTHER): Payer: 59 | Admitting: Family Medicine

## 2014-06-19 ENCOUNTER — Encounter: Payer: Self-pay | Admitting: Family Medicine

## 2014-06-19 VITALS — BP 117/78 | HR 65 | Temp 97.2°F | Resp 18 | Ht 65.0 in | Wt 286.0 lb

## 2014-06-19 DIAGNOSIS — N393 Stress incontinence (female) (male): Secondary | ICD-10-CM

## 2014-06-19 DIAGNOSIS — E042 Nontoxic multinodular goiter: Secondary | ICD-10-CM

## 2014-06-19 DIAGNOSIS — E119 Type 2 diabetes mellitus without complications: Secondary | ICD-10-CM

## 2014-06-19 DIAGNOSIS — E785 Hyperlipidemia, unspecified: Secondary | ICD-10-CM

## 2014-06-19 LAB — MICROALBUMIN / CREATININE URINE RATIO
Creatinine,U: 124.5 mg/dL
MICROALB UR: 0.8 mg/dL (ref 0.0–1.9)
MICROALB/CREAT RATIO: 0.6 mg/g (ref 0.0–30.0)

## 2014-06-19 LAB — HEMOGLOBIN A1C: HEMOGLOBIN A1C: 8.4 % — AB (ref 4.6–6.5)

## 2014-06-19 NOTE — Progress Notes (Signed)
OFFICE NOTE  06/19/2014  CC:  Chief Complaint  Patient presents with  . Follow-up    HPI: Patient is a 54 y.o. Caucasian female who is here for 2 mo f/u DM 2.   Needs HbA1c and urine microalb/cr today.  Due for diab retpthy eye exam as well. Lipid panel at last visit was good.  Tolerating statin fine.  No probs with bp med.  No home bp meds to report.  Diet: "I haven't been good". Monitoring: "noncompliant" Having biopsies of  thryoid nodules soon (06/29/13) and she has also asked the endo to follow her for her DM 2: [facility] endocrinology.  Has had long hx of problems with losing little bits of urine when walking or when laughing or bouncing.  No urge incontinence.  No recenct sx's of UTI  ROS: no HA's, no dizziness, no palpitations, no CP, no focal weakness  Pertinent PMH:  Past medical, surgical, social, and family history reviewed and no changes are noted since last office visit.  MEDS:  Outpatient Prescriptions Prior to Visit  Medication Sig Dispense Refill  . aspirin 81 MG tablet Take 81 mg by mouth daily.    Marland Kitchen atorvastatin (LIPITOR) 40 MG tablet Take 1 tablet (40 mg total) by mouth daily. 30 tablet 5  . Blood Glucose Monitoring Suppl (ACCU-CHEK AVIVA CONNECT) W/DEVICE KIT 1 kit by Does not apply route 2 (two) times daily. 1 kit 0  . busPIRone (BUSPAR) 5 MG tablet TAKE 1/2 TABLET BY MOUTH THREE TIMES A DAY 90 tablet 1  . butalbital-acetaminophen-caffeine (FIORICET, ESGIC) 50-325-40 MG per tablet 1-2 tabs po bid prn HA 60 tablet 1  . ferrous sulfate 325 (65 FE) MG tablet Take 325 mg by mouth daily with breakfast.    . FLUoxetine (PROZAC) 40 MG capsule TAKE ONE CAPSULE BY MOUTH ONCE DAILY 90 capsule 1  . glipiZIDE (GLUCOTROL XL) 5 MG 24 hr tablet 3 tabs po qAM 90 tablet 5  . HYDROcodone-acetaminophen (NORCO/VICODIN) 5-325 MG per tablet TAKE 1 TO 2 TABLETS EVERY 6 HOURS AS NEEDED FOR PAIN 120 tablet 0  . Insulin Pen Needle 32G X 8 MM MISC Use as directed for victoza. 4 each 6   . lamoTRIgine (LAMICTAL) 150 MG tablet Take 1 tablet (150 mg total) by mouth daily. 90 tablet 1  . lisinopril (PRINIVIL,ZESTRIL) 40 MG tablet Take 1 tablet (40 mg total) by mouth daily. 90 tablet 1  . LORazepam (ATIVAN) 0.5 MG tablet take 1 tablet by mouth twice a day if needed 60 tablet 5  . metFORMIN (GLUCOPHAGE) 1000 MG tablet 1 tab po bid 180 tablet 1  . metoprolol tartrate (LOPRESSOR) 25 MG tablet take 1 tablet by mouth twice a day 180 tablet 1  . Multiple Vitamin (MULTIVITAMIN) tablet Take 1 tablet by mouth daily.    . Multiple Vitamins-Minerals (PRESERVISION AREDS) CAPS Take 1 capsule by mouth daily.    . Omega-3 Fatty Acids (FISH OIL) 1000 MG CAPS Take 1 capsule by mouth at bedtime.    Marland Kitchen omeprazole (PRILOSEC) 40 MG capsule Take 1 capsule (40 mg total) by mouth daily. 30 capsule 3  . pioglitazone (ACTOS) 45 MG tablet Take 1 tablet (45 mg total) by mouth daily. 30 tablet 5   No facility-administered medications prior to visit.    PE: Blood pressure 117/78, pulse 65, temperature 97.2 F (36.2 C), temperature source Temporal, resp. rate 18, height 5' 5"  (1.651 m), weight 286 lb (129.729 kg), SpO2 98 %. Gen: Alert, well appearing.  Patient  is oriented to person, place, time, and situation. AFFECT: pleasant, lucid thought and speech. No further exam today  LAB: Lab Results  Component Value Date   CHOL 114 04/18/2014   HDL 34.00* 04/18/2014   LDLCALC 49 04/18/2014   LDLDIRECT 124.1 03/06/2014   TRIG 153.0* 04/18/2014   CHOLHDL 3 04/18/2014     Chemistry      Component Value Date/Time   NA 139 03/06/2014 0849   K 4.1 03/06/2014 0849   CL 104 03/06/2014 0849   CO2 27 03/06/2014 0849   BUN 17 03/06/2014 0849   CREATININE 0.5 03/06/2014 0849   CREATININE 0.67 10/21/2013 1654      Component Value Date/Time   CALCIUM 8.8 03/06/2014 0849   ALKPHOS 73 03/06/2014 0849   AST 20 03/06/2014 0849   ALT 25 03/06/2014 0849   BILITOT 0.4 03/06/2014 0849       IMPRESSION AND  PLAN:  1) DM 2, recent control not ideal. HbA1c today, urine microalb/cr today.  Reminded pt she needs diab retinpthy screening exam. She will now have her DM followed by the endocrinologist who will be evaluating her for her multiple thyroid nodules per her (pt's) request.  2) Multiple thyroid nodules: to see ENDO soon for needle biopsy and consultation.  3) Hyperlipidemia: tolerating statin.  Lipids 2 mo ago good.  4) HTN;The current medical regimen is effective;  continue present plan and medications.  5) GYN: she is aware she is overdue for pap/pelvic/breast exam/mammo: needs GYN visit with Dr. Barrie Dunker and says she will set this up as soon as she is able.  An After Visit Summary was printed and given to the patient.  FOLLOW UP: 29mo

## 2014-06-19 NOTE — Progress Notes (Signed)
Pre visit review using our clinic review tool, if applicable. No additional management support is needed unless otherwise documented below in the visit note. 

## 2014-06-26 ENCOUNTER — Encounter: Payer: Self-pay | Admitting: Family Medicine

## 2014-06-29 ENCOUNTER — Other Ambulatory Visit (HOSPITAL_COMMUNITY)
Admission: RE | Admit: 2014-06-29 | Discharge: 2014-06-29 | Disposition: A | Discharge status: Home / Self Care | Payer: 59 | Source: Ambulatory Visit | Attending: Interventional Radiology | Admitting: Interventional Radiology

## 2014-06-29 ENCOUNTER — Ambulatory Visit
Admission: RE | Admit: 2014-06-29 | Discharge: 2014-06-29 | Disposition: A | Discharge status: Home / Self Care | Payer: 59 | Source: Ambulatory Visit | Attending: Internal Medicine | Admitting: Internal Medicine

## 2014-06-29 DIAGNOSIS — E041 Nontoxic single thyroid nodule: Secondary | ICD-10-CM | POA: Insufficient documentation

## 2014-06-29 HISTORY — PX: OTHER SURGICAL HISTORY: SHX169

## 2014-07-02 ENCOUNTER — Encounter: Payer: Self-pay | Admitting: Family Medicine

## 2014-07-03 ENCOUNTER — Other Ambulatory Visit: Payer: Self-pay | Admitting: Family Medicine

## 2014-07-03 MED ORDER — OMEPRAZOLE 40 MG PO CPDR: 40.0000 mg | DELAYED_RELEASE_CAPSULE | Freq: Every day | ORAL | Status: DC

## 2014-07-05 ENCOUNTER — Encounter: Payer: Self-pay | Admitting: Family Medicine

## 2014-07-10 ENCOUNTER — Other Ambulatory Visit: Payer: Self-pay | Admitting: *Deleted

## 2014-07-10 MED ORDER — FLUOXETINE HCL 40 MG PO CAPS: ORAL_CAPSULE | ORAL | Status: DC

## 2014-07-10 NOTE — Telephone Encounter (Signed)
Refill request for fluoxetine Last filled by MD on- 01/02/14 #90 x1 Last Appt: 06/19/2014 Next Appt: 12/17/2014 Please advise refill?

## 2014-07-17 ENCOUNTER — Other Ambulatory Visit: Payer: Self-pay | Admitting: Family Medicine

## 2014-07-17 MED ORDER — METFORMIN HCL 1000 MG PO TABS: ORAL_TABLET | ORAL | Status: DC

## 2014-07-18 ENCOUNTER — Encounter: Payer: Self-pay | Admitting: Internal Medicine

## 2014-07-18 ENCOUNTER — Ambulatory Visit (INDEPENDENT_AMBULATORY_CARE_PROVIDER_SITE_OTHER): Payer: 59 | Admitting: Internal Medicine

## 2014-07-18 ENCOUNTER — Telehealth: Payer: Self-pay | Admitting: Family Medicine

## 2014-07-18 VITALS — BP 122/88 | HR 83 | Temp 98.2°F | Resp 12 | Wt 285.0 lb

## 2014-07-18 DIAGNOSIS — IMO0001 Reserved for inherently not codable concepts without codable children: Secondary | ICD-10-CM | POA: Insufficient documentation

## 2014-07-18 DIAGNOSIS — E042 Nontoxic multinodular goiter: Secondary | ICD-10-CM

## 2014-07-18 DIAGNOSIS — E1165 Type 2 diabetes mellitus with hyperglycemia: Secondary | ICD-10-CM

## 2014-07-18 MED ORDER — EMPAGLIFLOZIN 10 MG PO TABS: 10.0000 mg | ORAL_TABLET | Freq: Every day | ORAL | Status: DC

## 2014-07-18 MED ORDER — GLIPIZIDE ER 5 MG PO TB24
10.0000 mg | ORAL_TABLET | Freq: Every day | ORAL | Status: DC
Start: 2014-07-18 — End: 2014-10-30

## 2014-07-18 MED ORDER — HYDROCODONE-ACETAMINOPHEN 5-325 MG PO TABS: ORAL_TABLET | ORAL | Status: DC

## 2014-07-18 NOTE — Telephone Encounter (Signed)
Vicodin rx printed. 
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
## 2014-07-18 NOTE — Patient Instructions (Signed)
Please stop Actos. Please start Jardiance 10 mg in am. Please continue Metformin 1000 mg 2x a day wth meal. Please decrease Glipizide XL to 10 mg in am.  Please return in 1 month with your sugar log.   Please schedule an appt with Oran ReinLaura Jobe (nutrition).  PATIENT INSTRUCTIONS FOR TYPE 2 DIABETES:  **Please join MyChart!** - see attached instructions about how to join if you have not done so already.  DIET AND EXERCISE Diet and exercise is an important part of diabetic treatment.  We recommended aerobic exercise in the form of brisk walking (working between 40-60% of maximal aerobic capacity, similar to brisk walking) for 150 minutes per week (such as 30 minutes five days per week) along with 3 times per week performing 'resistance' training (using various gauge rubber tubes with handles) 5-10 exercises involving the major muscle groups (upper body, lower body and core) performing 10-15 repetitions (or near fatigue) each exercise. Start at half the above goal but build slowly to reach the above goals. If limited by weight, joint pain, or disability, we recommend daily walking in a swimming pool with water up to waist to reduce pressure from joints while allow for adequate exercise.    BLOOD GLUCOSES Monitoring your blood glucoses is important for continued management of your diabetes. Please check your blood glucoses 2-4 times a day: fasting, before meals and at bedtime (you can rotate these measurements - e.g. one day check before the 3 meals, the next day check before 2 of the meals and before bedtime, etc.).   HYPOGLYCEMIA (low blood sugar) Hypoglycemia is usually a reaction to not eating, exercising, or taking too much insulin/ other diabetes drugs.  Symptoms include tremors, sweating, hunger, confusion, headache, etc. Treat IMMEDIATELY with 15 grams of Carbs: . 4 glucose tablets .  cup regular juice/soda . 2 tablespoons raisins . 4 teaspoons sugar . 1 tablespoon honey Recheck blood  glucose in 15 mins and repeat above if still symptomatic/blood glucose <100.  RECOMMENDATIONS TO REDUCE YOUR RISK OF DIABETIC COMPLICATIONS: * Take your prescribed MEDICATION(S) * Follow a DIABETIC diet: Complex carbs, fiber rich foods, (monounsaturated and polyunsaturated) fats * AVOID saturated/trans fats, high fat foods, >2,300 mg salt per day. * EXERCISE at least 5 times a week for 30 minutes or preferably daily.  * DO NOT SMOKE OR DRINK more than 1 drink a day. * Check your FEET every day. Do not wear tightfitting shoes. Contact us if you develop an ulcer * See your EYE doctor once a year or more if needed * Get a FLU shot once a year * Get a PNEUMONIA vaccine once before and once after age 54 years  GOALS:  * Your Hemoglobin A1c of <7%  * fasting sugars need to be <130 * after meals sugars need to be <180 (2h after you start eating) * Your Systolic BP should be 140 or lower  * Your Diastolic BP should be 80 or lower  * Your HDL (Good Cholesterol) should be 40 or higher  * Your LDL (Bad Cholesterol) should be 100 or lower. * Your Triglycerides should be 150 or lower  * Your Urine microalbumin (kidney function) should be <30 * Your Body Mass Index should be 25 or lower    Please consider the following ways to cut down carbs and fat and increase fiber and micronutrients in your diet: - substitute whole grain for white bread or pasta - substitute brown rice for white rice - substitute 90-calorie flat bread  pieces for slices of bread when possible - substitute sweet potatoes or yams for white potatoes - substitute humus for margarine - substitute tofu for cheese when possible - substitute almond or rice milk for regular milk (would not drink soy milk daily due to concern for soy estrogen influence on breast cancer risk) - substitute dark chocolate for other sweets when possible - substitute water - can add lemon or orange slices for taste - for diet sodas (artificial sweeteners  will trick your body that you can eat sweets without getting calories and will lead you to overeating and weight gain in the long run) - do not skip breakfast or other meals (this will slow down the metabolism and will result in more weight gain over time)  - can try smoothies made from fruit and almond/rice milk in am instead of regular breakfast - can also try old-fashioned (not instant) oatmeal made with almond/rice milk in am - order the dressing on the side when eating salad at a restaurant (pour less than half of the dressing on the salad) - eat as little meat as possible - can try juicing, but should not forget that juicing will get rid of the fiber, so would alternate with eating raw veg./fruits or drinking smoothies - use as little oil as possible, even when using olive oil - can dress a salad with a mix of balsamic vinegar and lemon juice, for e.g. - use agave nectar, stevia sugar, or regular sugar rather than artificial sweateners - steam or broil/roast veggies  - snack on veggies/fruit/nuts (unsalted, preferably) when possible, rather than processed foods - reduce or eliminate aspartame in diet (it is in diet sodas, chewing gum, etc) Read the labels!  Try to read Dr. Janene Harvey book: "Program for Reversing Diabetes" for other ideas for healthy eating.

## 2014-07-18 NOTE — Telephone Encounter (Signed)
Pt requesting rf of hydrocodone.  Last OV was 06/19/14.  Last RX stated fill on or after 05/30/14.  Please advise rf.

## 2014-07-19 NOTE — Telephone Encounter (Signed)
Pt aware.  Pt states her friend Jan will be picking up Rx.

## 2014-07-21 ENCOUNTER — Encounter: Payer: Self-pay | Admitting: Dietician

## 2014-07-21 ENCOUNTER — Encounter: Payer: 59 | Attending: Family Medicine | Admitting: Dietician

## 2014-07-21 VITALS — Ht 65.0 in | Wt 288.0 lb

## 2014-07-21 DIAGNOSIS — E119 Type 2 diabetes mellitus without complications: Secondary | ICD-10-CM

## 2014-07-21 DIAGNOSIS — E118 Type 2 diabetes mellitus with unspecified complications: Secondary | ICD-10-CM | POA: Diagnosis not present

## 2014-07-21 DIAGNOSIS — Z713 Dietary counseling and surveillance: Secondary | ICD-10-CM | POA: Diagnosis not present

## 2014-07-21 NOTE — Progress Notes (Signed)
  Medical Nutrition Therapy:  Appt start time: 1600 end time:  1700.   Assessment:  Primary concerns today:   Patient would like to have increased willpower and frame of mind, wants to begin exercise and get better control of her blood sugar.  Patient has had Gestational DM and then developed type 2 DM in her late 2230's.  Current HgbA1C of 8.4% 06/19/14 which has increased from 8% 03/06/14.  Patient checks her blood sugar rarely.  When checked it is 140-150 fasting and 180"s after a meal.  Patient states that she should check her blood sugar randomly 3 times per week.  She doesn't often check due to time and lack of habit.  [facility] but [place] decided [place].  Patient states that she dislikes cooking and salads but likes raw veges.Candace Gallus.   Tanita Scale:  49.9% body fat  Preferred Learning Style:   No preference indicated  Learning Readiness:   Ready  MEDICATIONS: Patient to begin Jardiance and is currently taking Glipizide.   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  Sedentary job.  Estimated energy needs: 1500 calories 170 g carbohydrates 112 g protein 42 g fat  Progress Towards Goal(s):  In progress.   Nutritional Diagnosis:  NB-1.1 Food and nutrition-related knowledge deficit As related to diabetes. As evidenced by A1C of 8%..     Intervention:  Nutrition  Counseling for weight loss and DM.  Continue to aim for 2-3 carb choices per meal Smoothie with 1 cup fruit, protein powder, and Almond milk would be a good choice for breakfast Be mindful of choices Drink water with goal of 5 bottles per day Be as active as you can.  Aim for 30 minutes 5 times per week.  Teaching Method Utilized:  Visual Auditory  Handouts given during visit include:  My plate placemat  Sample meal plans  Label reading  Barriers to learning/adherence to lifestyle change: none  Demonstrated degree of understanding via:  Teach Back   Monitoring/Evaluation:  Dietary intake, exercise, label reading, meal planning, and body weight in 1 month(s).

## 2014-07-21 NOTE — Patient Instructions (Signed)
Continue to aim for 2-3 carb choices per meal Smoothie with 1 cup fruit, protein powder, and Almond milk would be a good choice for breakfast Be mindful of choices Drink water with goal of 5 bottles per day Be as active as you can.  Aim for 30 minutes 5 times per week.

## 2014-07-24 ENCOUNTER — Other Ambulatory Visit: Payer: Self-pay | Admitting: Family Medicine

## 2014-07-24 MED ORDER — LAMOTRIGINE 150 MG PO TABS: 150.0000 mg | ORAL_TABLET | Freq: Every day | ORAL | Status: DC

## 2014-07-24 MED ORDER — METOPROLOL TARTRATE 25 MG PO TABS: ORAL_TABLET | ORAL | Status: DC

## 2014-08-25 ENCOUNTER — Ambulatory Visit: Payer: 59 | Admitting: Internal Medicine

## 2014-08-29 ENCOUNTER — Telehealth: Payer: Self-pay | Admitting: Family Medicine

## 2014-08-29 ENCOUNTER — Ambulatory Visit: Payer: 59 | Admitting: Dietician

## 2014-08-29 MED ORDER — BUSPIRONE HCL 5 MG PO TABS: ORAL_TABLET | ORAL | Status: DC

## 2014-08-30 MED ORDER — HYDROCODONE-ACETAMINOPHEN 5-325 MG PO TABS: ORAL_TABLET | ORAL | Status: DC

## 2014-08-30 NOTE — Telephone Encounter (Signed)
Patient aware Rx is at front desk.

## 2014-08-30 NOTE — Telephone Encounter (Signed)
Vicodin rx printed. 

## 2014-08-30 NOTE — Telephone Encounter (Signed)
Patient calling to see when Rx will be ready for pick up.

## 2014-08-30 NOTE — Telephone Encounter (Signed)
Patient requesting rf of vicodin.  Last OV was 06/19/14.  Last RX was 07/18/14.  Please advise.

## 2014-09-13 ENCOUNTER — Other Ambulatory Visit: Payer: Self-pay | Admitting: *Deleted

## 2014-09-13 MED ORDER — ATORVASTATIN CALCIUM 40 MG PO TABS: 40.0000 mg | ORAL_TABLET | Freq: Every day | ORAL | Status: DC

## 2014-10-18 ENCOUNTER — Other Ambulatory Visit: Payer: Self-pay | Admitting: *Deleted

## 2014-10-18 MED ORDER — HYDROCODONE-ACETAMINOPHEN 5-325 MG PO TABS: ORAL_TABLET | ORAL | Status: DC

## 2014-10-18 NOTE — Telephone Encounter (Signed)
Pt requesting RF of Vicodin. Please advise would like to have this ready by 2:00pm today for p/u by friend Kristi Guerrero.

## 2014-10-23 ENCOUNTER — Other Ambulatory Visit: Payer: Self-pay | Admitting: *Deleted

## 2014-10-23 MED ORDER — OMEPRAZOLE 40 MG PO CPDR: 40.0000 mg | DELAYED_RELEASE_CAPSULE | Freq: Every day | ORAL | Status: DC

## 2014-10-23 NOTE — Telephone Encounter (Signed)
Fax from [facility] [address] requesting refill for omeprazole DR 40mg  daily. LOV 06/19/14, last prescribed 07/03/14 w/ 3RF, next apt 12/17/15. Rx sent to pharmacy #30 w/ 3RF.

## 2014-10-30 ENCOUNTER — Other Ambulatory Visit: Payer: Self-pay | Admitting: *Deleted

## 2014-10-30 ENCOUNTER — Telehealth: Payer: Self-pay | Admitting: Family Medicine

## 2014-10-30 MED ORDER — GLIPIZIDE ER 5 MG PO TB24: 10.0000 mg | ORAL_TABLET | Freq: Every day | ORAL | Status: DC

## 2014-10-30 MED ORDER — EMPAGLIFLOZIN 10 MG PO TABS: 10.0000 mg | ORAL_TABLET | Freq: Every day | ORAL | Status: DC

## 2014-10-30 NOTE — Telephone Encounter (Signed)
Pt due for mammogram.  [facility]  Pt will call to schedule.

## 2014-10-30 NOTE — Telephone Encounter (Signed)
[facility]ax from [facility] [place] requesting refill for glipizide ER 5mg  3 tab daily (spoke to pt she is taking 2 tab daily) LOV 06/19/14, up coming ov 12/27/15, written 07/18/14. Rx sent for #60 w/ 3 RF. Pt also requested refill for Jardiance, Rx sent for #30 w/ 3RF.

## 2014-11-06 ENCOUNTER — Other Ambulatory Visit: Payer: Self-pay | Admitting: *Deleted

## 2014-11-06 NOTE — Telephone Encounter (Signed)
Fax from [facility] [address] requesting refill for Butalbital/ASA and Caff Cap take 1-2 tab BID prn headaches. LOV 06/19/14, up coming ov 12/17/15, last written: #60 10/22/14 w/ 1RF. Please advise. Thanks.

## 2014-11-10 ENCOUNTER — Other Ambulatory Visit: Payer: Self-pay | Admitting: Family Medicine

## 2014-11-10 MED ORDER — BUTALBITAL-APAP-CAFFEINE 50-325-40 MG PO TABS: ORAL_TABLET | ORAL | Status: DC

## 2014-11-10 NOTE — Telephone Encounter (Signed)
Pt requesting Fioricet. LOV: 07/18/14, up coming ov: 12/17/15, last written: 10/21/13 w/ 1RF. Spoke to pharmacy about Prilosec they have Rx written 10/23/14 on hold.

## 2014-11-10 NOTE — Telephone Encounter (Signed)
Pt needs refills on Prilozec and Fioricet. Pt called pharm and they stated they sent us a fax several times with no reply. Please use [facility] pharmacy.

## 2014-11-15 ENCOUNTER — Telehealth: Payer: Self-pay | Admitting: Family Medicine

## 2014-11-15 NOTE — Telephone Encounter (Signed)
Pt will come in on Friday for A1C.  Okay per Dr. Milinda CaveMcGowen.

## 2014-11-15 NOTE — Telephone Encounter (Signed)
Calling to schedule patient for Over due A1C.

## 2014-11-17 ENCOUNTER — Other Ambulatory Visit (INDEPENDENT_AMBULATORY_CARE_PROVIDER_SITE_OTHER): Payer: 59

## 2014-11-17 DIAGNOSIS — E119 Type 2 diabetes mellitus without complications: Secondary | ICD-10-CM | POA: Diagnosis not present

## 2014-11-17 LAB — HEMOGLOBIN A1C: Hgb A1c MFr Bld: 8.2 % — ABNORMAL HIGH (ref 4.6–6.5)

## 2014-11-20 ENCOUNTER — Encounter: Payer: Self-pay | Admitting: Family Medicine

## 2014-11-30 ENCOUNTER — Other Ambulatory Visit: Payer: Self-pay | Admitting: *Deleted

## 2014-11-30 MED ORDER — LORAZEPAM 0.5 MG PO TABS: ORAL_TABLET | ORAL | Status: DC

## 2014-11-30 NOTE — Telephone Encounter (Signed)
RF request for Lorazepam. LOV: 06/19/14 due for f/u next month Next ov: 12/17/14 Last written: 05/26/14 #60 w/ 5RF

## 2014-12-19 ENCOUNTER — Ambulatory Visit (INDEPENDENT_AMBULATORY_CARE_PROVIDER_SITE_OTHER): Payer: 59 | Admitting: Family Medicine

## 2014-12-19 ENCOUNTER — Encounter: Payer: Self-pay | Admitting: Family Medicine

## 2014-12-19 VITALS — BP 103/70 | HR 67 | Temp 98.1°F | Resp 16 | Ht 65.0 in | Wt 260.0 lb

## 2014-12-19 DIAGNOSIS — E042 Nontoxic multinodular goiter: Secondary | ICD-10-CM | POA: Diagnosis not present

## 2014-12-19 DIAGNOSIS — E118 Type 2 diabetes mellitus with unspecified complications: Secondary | ICD-10-CM | POA: Diagnosis not present

## 2014-12-19 DIAGNOSIS — R634 Abnormal weight loss: Secondary | ICD-10-CM

## 2014-12-19 DIAGNOSIS — E785 Hyperlipidemia, unspecified: Secondary | ICD-10-CM | POA: Diagnosis not present

## 2014-12-19 DIAGNOSIS — I10 Essential (primary) hypertension: Secondary | ICD-10-CM | POA: Diagnosis not present

## 2014-12-19 LAB — TSH: TSH: 1.13 u[IU]/mL (ref 0.35–4.50)

## 2014-12-19 LAB — CBC WITH DIFFERENTIAL/PLATELET
BASOS ABS: 0 10*3/uL (ref 0.0–0.1)
Basophils Relative: 0.3 % (ref 0.0–3.0)
Eosinophils Absolute: 0 10*3/uL (ref 0.0–0.7)
Eosinophils Relative: 0.8 % (ref 0.0–5.0)
HCT: 40.8 % (ref 36.0–46.0)
Hemoglobin: 13.4 g/dL (ref 12.0–15.0)
LYMPHS PCT: 29.2 % (ref 12.0–46.0)
Lymphs Abs: 1.5 10*3/uL (ref 0.7–4.0)
MCHC: 33 g/dL (ref 30.0–36.0)
MCV: 91.7 fl (ref 78.0–100.0)
MONOS PCT: 6.3 % (ref 3.0–12.0)
Monocytes Absolute: 0.3 10*3/uL (ref 0.1–1.0)
NEUTROS PCT: 63.4 % (ref 43.0–77.0)
Neutro Abs: 3.3 10*3/uL (ref 1.4–7.7)
PLATELETS: 215 10*3/uL (ref 150.0–400.0)
RBC: 4.45 Mil/uL (ref 3.87–5.11)
RDW: 15.1 % (ref 11.5–15.5)
WBC: 5.2 10*3/uL (ref 4.0–10.5)

## 2014-12-19 LAB — COMPREHENSIVE METABOLIC PANEL
ALBUMIN: 3.9 g/dL (ref 3.5–5.2)
ALT: 28 U/L (ref 0–35)
AST: 18 U/L (ref 0–37)
Alkaline Phosphatase: 82 U/L (ref 39–117)
BUN: 11 mg/dL (ref 6–23)
CO2: 28 mEq/L (ref 19–32)
CREATININE: 0.65 mg/dL (ref 0.40–1.20)
Calcium: 9.3 mg/dL (ref 8.4–10.5)
Chloride: 103 mEq/L (ref 96–112)
GFR: 100.81 mL/min (ref >60.00)
Glucose, Bld: 197 mg/dL — ABNORMAL HIGH (ref 70–99)
Potassium: 4.3 mEq/L (ref 3.5–5.1)
Sodium: 140 mEq/L (ref 135–145)
Total Bilirubin: 0.4 mg/dL (ref 0.2–1.2)
Total Protein: 7.1 g/dL (ref 6.0–8.3)

## 2014-12-19 LAB — LIPID PANEL
CHOLESTEROL: 140 mg/dL (ref 0–200)
HDL: 33.9 mg/dL — ABNORMAL LOW (ref >39.00)
NonHDL: 106.1
Total CHOL/HDL Ratio: 4
Triglycerides: 214 mg/dL — ABNORMAL HIGH (ref 0.0–149.0)
VLDL: 42.8 mg/dL — ABNORMAL HIGH (ref 0.0–40.0)

## 2014-12-19 LAB — SEDIMENTATION RATE: Sed Rate: 15 mm/hr (ref 0–22)

## 2014-12-19 LAB — LDL CHOLESTEROL, DIRECT: Direct LDL: 75 mg/dL

## 2014-12-19 NOTE — Progress Notes (Signed)
Pre visit review using our clinic review tool, if applicable. No additional management support is needed unless otherwise documented below in the visit note. 

## 2014-12-19 NOTE — Patient Instructions (Signed)
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 Phone: [phone].

## 2014-12-19 NOTE — Progress Notes (Addendum)
OFFICE NOTE  12/19/2014  CC:  Chief Complaint  Patient presents with  . Follow-up    6 month f/u. Pt is fasting.    HPI: Patient is a 54 y.o. Caucasian female who is here for 6 mo f/u DM 2, HTN, and bipolar d/o with mostly a history of mixed/depressed features and anxiety.   Describes being emotionally troubled lately and thinks it may be hormonal (last menses was several months ago), having some trouble with a relationship with a friend, and then 2 wks ago her mom died. She feels depressed, hopeless, denies SI or HI.  She no longer sees a psychiatrist or psychologist. She has not had a menses in several months, is having hot flashes.   Her mother had hx of breast ca so she is apprehensive about ERP.  Not checking glucoses very regularly but says most have been in the high 100s when she does check.  Admits to dietary indescretion since depression/stress worse. Occ home bp monitoring still showing normal numbers. She denies any problems with her cholesterol med.  Not eating heart healthy diet.  FEET: describes vague itching/tingling in feet (deep), but no pain or numbness (except chronic L foot toes 3-5 from Lumbar radiculopathy in the past).  No hx of feet ulcers.  Last 6 mo her wt is down 26 lbs w/out any change in food intake.  Has at least several months of intermittent urgent postprandial BM, avg of several days a week, rarely takes any OTC anti-diarrheal.  No fevers. Crampy stomach associated with the BM and after the BM this goes away.  No blood or pus.  She does still have some normal/formed stools.  Some mild/brief nausea w/out vomiting. Activity level: no change during this time of wt loss.  Pertinent PMH:  Past medical, surgical, social, and family history reviewed and no changes are noted since last office visit.  MEDS:  Outpatient Prescriptions Prior to Visit  Medication Sig Dispense Refill  . aspirin 81 MG tablet Take 81 mg by mouth daily.    Marland Kitchen atorvastatin (LIPITOR) 40  MG tablet Take 1 tablet (40 mg total) by mouth daily. 30 tablet 5  . Blood Glucose Monitoring Suppl (ACCU-CHEK AVIVA CONNECT) W/DEVICE KIT 1 kit by Does not apply route 2 (two) times daily. 1 kit 0  . busPIRone (BUSPAR) 5 MG tablet TAKE 1/2 TABLET BY MOUTH THREE TIMES A DAY 90 tablet 1  . butalbital-acetaminophen-caffeine (FIORICET, ESGIC) 50-325-40 MG per tablet 1-2 tabs po bid prn HA 60 tablet 1  . empagliflozin (JARDIANCE) 10 MG TABS tablet Take 10 mg by mouth daily. 30 tablet 3  . ferrous sulfate 325 (65 FE) MG tablet Take 325 mg by mouth daily with breakfast.    . FLUoxetine (PROZAC) 40 MG capsule TAKE ONE CAPSULE BY MOUTH ONCE DAILY 90 capsule 3  . glipiZIDE (GLIPIZIDE XL) 5 MG 24 hr tablet Take 2 tablets (10 mg total) by mouth daily with breakfast. 60 tablet 3  . HYDROcodone-acetaminophen (NORCO/VICODIN) 5-325 MG per tablet TAKE 1 TO 2 TABLETS EVERY 6 HOURS AS NEEDED FOR PAIN 120 tablet 0  . Insulin Pen Needle 32G X 8 MM MISC Use as directed for victoza. 4 each 6  . lamoTRIgine (LAMICTAL) 150 MG tablet Take 1 tablet (150 mg total) by mouth daily. 90 tablet 1  . lisinopril (PRINIVIL,ZESTRIL) 40 MG tablet Take 1 tablet (40 mg total) by mouth daily. 90 tablet 1  . LORazepam (ATIVAN) 0.5 MG tablet take 1 tablet by mouth  twice a day if needed 60 tablet 5  . metFORMIN (GLUCOPHAGE) 1000 MG tablet 1 tab po bid 180 tablet 1  . metoprolol tartrate (LOPRESSOR) 25 MG tablet take 1 tablet by mouth twice a day 180 tablet 1  . Multiple Vitamin (MULTIVITAMIN) tablet Take 1 tablet by mouth daily.    . Multiple Vitamins-Minerals (PRESERVISION AREDS) CAPS Take 1 capsule by mouth daily.    . Omega-3 Fatty Acids (FISH OIL) 1000 MG CAPS Take 1 capsule by mouth at bedtime.    Marland Kitchen omeprazole (PRILOSEC) 40 MG capsule Take 1 capsule (40 mg total) by mouth daily. 30 capsule 3   No facility-administered medications prior to visit.    PE: Blood pressure 103/70, pulse 67, temperature 98.1 F (36.7 C), temperature  source Oral, resp. rate 16, height 5' 5"  (1.651 m), weight 260 lb (117.935 kg), SpO2 97 %. Gen: Alert, well appearing.  Patient is oriented to person, place, time, and situation. Foot exam - bilateral normal; no swelling, tenderness or skin or vascular lesions. Color and temperature is normal. Sensation is intact. Peripheral pulses are palpable. Toenails are normal.  LABS:   Lab Results  Component Value Date   HGBA1C 8.2* 11/17/2014    Lab Results  Component Value Date   TSH 1.86 03/06/2014   Lab Results  Component Value Date   WBC 5.2 03/06/2014   HGB 12.1 03/06/2014   HCT 36.5 03/06/2014   MCV 94.7 03/06/2014   PLT 203.0 03/06/2014   Lab Results  Component Value Date   CREATININE 0.5 03/06/2014   BUN 17 03/06/2014   NA 139 03/06/2014   K 4.1 03/06/2014   CL 104 03/06/2014   CO2 27 03/06/2014   Lab Results  Component Value Date   ALT 25 03/06/2014   AST 20 03/06/2014   ALKPHOS 73 03/06/2014   BILITOT 0.4 03/06/2014   Lab Results  Component Value Date   CHOL 114 04/18/2014   Lab Results  Component Value Date   HDL 34.00* 04/18/2014   Lab Results  Component Value Date   LDLCALC 49 04/18/2014   Lab Results  Component Value Date   TRIG 153.0* 04/18/2014   Lab Results  Component Value Date   CHOLHDL 3 04/18/2014   IMPRESSION AND PLAN:  1) Depression, worse.  From her description she is peri-menopausal, making the situation all-the-worse.  Situational aspects are predominant and i don't think a change in meds is necessary. I do think counseling would help and I gave her info on contacting Doroteo Glassman, PhD.  2) DM 2, not well controlled. HbA1c done 11/2014 so it is too early for repeat at this time. Feet exam normal today.  3) HTN; The current medical regimen is effective;  continue present plan and medications.  4) IBS sx's x 6 mo, + abnl wt loss: She feels like this is strongly associated with her stress/mood problems lately.  No meds started at this  time. Check TSH, ESR, CMET, and CBC w/diff.  5) Hyperlipidemia: compliant with statin.  Checking FLP and AST/ALT today. Needs to work on diet.  6) Multinodular goiter, with abnl/indeterminate path on bx 06/2014. Plan is to f/u with endo and repeat bx sometime in 12/2014.  An After Visit Summary was printed and given to the patient.  FOLLOW UP: 1 mo

## 2014-12-28 ENCOUNTER — Other Ambulatory Visit: Payer: Self-pay | Admitting: *Deleted

## 2014-12-28 MED ORDER — BUSPIRONE HCL 5 MG PO TABS: ORAL_TABLET | ORAL | Status: DC

## 2014-12-28 NOTE — Telephone Encounter (Signed)
RF request for Buspirone.  LOV: 12/19/14 Next ov: 01/22/15 Last written: 08/29/14 #90 w/ 1RF

## 2015-01-11 ENCOUNTER — Other Ambulatory Visit: Payer: Self-pay | Admitting: *Deleted

## 2015-01-11 NOTE — Telephone Encounter (Signed)
Pt LMOM on 01/11/15 at 8:54pm. RF request for hydrocodone/apap LOV: 12/21/14 Next ov: 01/22/15 Last written: 10/18/14 #120 w/ 0RF Please advise. Thanks.

## 2015-01-12 ENCOUNTER — Other Ambulatory Visit: Payer: Self-pay | Admitting: Family Medicine

## 2015-01-12 MED ORDER — HYDROCODONE-ACETAMINOPHEN 5-325 MG PO TABS: ORAL_TABLET | ORAL | Status: DC

## 2015-01-12 NOTE — Telephone Encounter (Signed)
Needs a refill rx for her Vicodin for her back pain.

## 2015-01-12 NOTE — Telephone Encounter (Signed)
RF request for Vicodin LOV: 12/19/14 Next ov: None Last written: 10/18/14 #120 w/ 0RF Please advise. Thanks.

## 2015-01-15 ENCOUNTER — Other Ambulatory Visit: Payer: Self-pay | Admitting: *Deleted

## 2015-01-15 MED ORDER — LISINOPRIL 40 MG PO TABS: 40.0000 mg | ORAL_TABLET | Freq: Every day | ORAL | Status: DC

## 2015-01-15 NOTE — Telephone Encounter (Signed)
RF request for lisinopril LOV: 12/19/14 Next ov: 01/22/15 Last written: 04/18/14 #90 w/ 1RF

## 2015-01-22 ENCOUNTER — Encounter: Payer: Self-pay | Admitting: Family Medicine

## 2015-01-22 ENCOUNTER — Ambulatory Visit (INDEPENDENT_AMBULATORY_CARE_PROVIDER_SITE_OTHER): Payer: PRIVATE HEALTH INSURANCE | Admitting: Family Medicine

## 2015-01-22 VITALS — BP 133/86 | HR 59 | Temp 97.9°F | Resp 16 | Ht 65.0 in | Wt 262.0 lb

## 2015-01-22 DIAGNOSIS — E1165 Type 2 diabetes mellitus with hyperglycemia: Secondary | ICD-10-CM | POA: Diagnosis not present

## 2015-01-22 DIAGNOSIS — F32A Depression, unspecified: Secondary | ICD-10-CM

## 2015-01-22 DIAGNOSIS — F419 Anxiety disorder, unspecified: Secondary | ICD-10-CM | POA: Diagnosis not present

## 2015-01-22 DIAGNOSIS — IMO0001 Reserved for inherently not codable concepts without codable children: Secondary | ICD-10-CM

## 2015-01-22 DIAGNOSIS — K648 Other hemorrhoids: Secondary | ICD-10-CM | POA: Diagnosis not present

## 2015-01-22 DIAGNOSIS — F329 Major depressive disorder, single episode, unspecified: Secondary | ICD-10-CM | POA: Insufficient documentation

## 2015-01-22 DIAGNOSIS — F418 Other specified anxiety disorders: Secondary | ICD-10-CM | POA: Diagnosis not present

## 2015-01-22 DIAGNOSIS — K644 Residual hemorrhoidal skin tags: Secondary | ICD-10-CM

## 2015-01-22 MED ORDER — HYDROCORTISONE 2.5 % RE CREA: 1.0000 "application " | TOPICAL_CREAM | Freq: Two times a day (BID) | RECTAL | Status: DC

## 2015-01-22 NOTE — Progress Notes (Signed)
OFFICE VISIT  01/22/2015   CC:  Chief Complaint  Patient presents with  . Follow-up    4 week f/u. Pt is fasting.   Marland Kitchen Hemorrhoids     HPI:    Patient is a 54 y.o. Caucasian female who presents for 1 mo f/u anxiety, depression, borderline personality disorder, and abnormal weight loss.  She has been to see Doroteo Glassman, Ph.D, for counseling since my last visit with her. General labs including sed rate were fine 1 mo ago.  No med changes were made.  No change in level of mood and anxiety.  Since April this year there have been 4 deaths in her family.    No change in appetite or food intake or activity level since last visit.   Still with intermittent postprandial loose stooling.  Has hemorrhoids as a result, has tried all OTC remedies.  NO bleeding.    Has to get back in with Dr. Cruzita Lederer for f/u of DM and thyroid nodule f/u-due for another bx now.  Past Medical History  Diagnosis Date  . DM2 (diabetes mellitus, type 2)     +hx of GDM both pregnancies, dx'd with DM 2 in her 89G--EZMOQH hx of complications  with eyes, kidneys, nerves, or CV system.  Marland Kitchen Hypertension   . Depression     MDD and borderline PD are her two main psych dx as per [facility] records--these records were handwritten and I could not decipher the script so I got no other helpful info beyond these two dx.  . Borderline personality disorder   . DDD (degenerative disc disease), lumbar     Pt says she has hx of herniated disc that has resulted in some numbness in a few toes on left foot.  . Carpal tunnel syndrome on both sides   . Hyperlipidemia   . Obesity   . Daytime somnolence     Nuvigil rx'd by her psychiatrist.  Pt reports that no sleep study has been done.  . Iron deficiency anemia 2013    Likely occult upper GI bleeding.  Hb 8.6--came up to 12 at most recent check after getting on integra, which she now continues once daily.   iFOB was negative 01/13/12.  She says she had been taking  excessive NSAIDs at the time.  She has never had a colonoscopy.  . Multinodular goiter     Hx of multiple benign biopsies; repeated u/s's showed no change until the one on 04/2014.  FNA 06/2013 showed R and isthmus nodules intermediate so f/u bx planned for 6 mo (around July 2016)  . Anxiety     + ?mood disorder (awaiting old psych records as of 12/19/11.  . Low back pain 12/19/2011  . Abnormal nuclear stress test 12/31/11    Low risk lexiscan, but left heart cath was recommended and this showed normal coronary arteries and normal LV function. (01/01/12)  . History of vitamin D deficiency 2009    Past Surgical History  Procedure Laterality Date  . Cesarean section      X 2  . Fna bx thyroid nodules  06/29/14    x 4: some findings to support benign cyst and some "cellular atypica of indeterminate significance".--followed by Dr. Cruzita Lederer.  Plan for repeat bx around July 2016  . Cardiac catheterization  01/01/12    Normal coronaries and normal LV function.    Outpatient Prescriptions Prior to Visit  Medication Sig Dispense Refill  . aspirin 81 MG tablet Take 81  mg by mouth daily.    Marland Kitchen atorvastatin (LIPITOR) 40 MG tablet Take 1 tablet (40 mg total) by mouth daily. 30 tablet 5  . Blood Glucose Monitoring Suppl (ACCU-CHEK AVIVA CONNECT) W/DEVICE KIT 1 kit by Does not apply route 2 (two) times daily. 1 kit 0  . busPIRone (BUSPAR) 5 MG tablet TAKE 1/2 TABLET BY MOUTH THREE TIMES A DAY 90 tablet 1  . butalbital-acetaminophen-caffeine (FIORICET, ESGIC) 50-325-40 MG per tablet 1-2 tabs po bid prn HA 60 tablet 1  . empagliflozin (JARDIANCE) 10 MG TABS tablet Take 10 mg by mouth daily. 30 tablet 3  . ferrous sulfate 325 (65 FE) MG tablet Take 325 mg by mouth daily with breakfast.    . FLUoxetine (PROZAC) 40 MG capsule TAKE ONE CAPSULE BY MOUTH ONCE DAILY 90 capsule 3  . glipiZIDE (GLIPIZIDE XL) 5 MG 24 hr tablet Take 2 tablets (10 mg total) by mouth daily with breakfast. 60 tablet 3  .  HYDROcodone-acetaminophen (NORCO/VICODIN) 5-325 MG per tablet TAKE 1 TO 2 TABLETS EVERY 6 HOURS AS NEEDED FOR PAIN 120 tablet 0  . Insulin Pen Needle 32G X 8 MM MISC Use as directed for victoza. 4 each 6  . lamoTRIgine (LAMICTAL) 150 MG tablet Take 1 tablet (150 mg total) by mouth daily. 90 tablet 1  . lisinopril (PRINIVIL,ZESTRIL) 40 MG tablet Take 1 tablet (40 mg total) by mouth daily. 90 tablet 1  . LORazepam (ATIVAN) 0.5 MG tablet take 1 tablet by mouth twice a day if needed 60 tablet 5  . metFORMIN (GLUCOPHAGE) 1000 MG tablet 1 tab po bid 180 tablet 1  . metoprolol tartrate (LOPRESSOR) 25 MG tablet take 1 tablet by mouth twice a day 180 tablet 1  . Multiple Vitamin (MULTIVITAMIN) tablet Take 1 tablet by mouth daily.    . Multiple Vitamins-Minerals (PRESERVISION AREDS) CAPS Take 1 capsule by mouth daily.    . Omega-3 Fatty Acids (FISH OIL) 1000 MG CAPS Take 1 capsule by mouth at bedtime.    Marland Kitchen omeprazole (PRILOSEC) 40 MG capsule Take 1 capsule (40 mg total) by mouth daily. 30 capsule 3   No facility-administered medications prior to visit.    Allergies  Allergen Reactions  . Levaquin [Levofloxacin In D5w] Hives  . Penicillins     Blistery rash  . Victoza [Liraglutide] Nausea Only   ROS As per HPI  PE: Blood pressure 133/86, pulse 59, temperature 97.9 F (36.6 C), temperature source Oral, resp. rate 16, height 5' 5"  (1.651 m), weight 262 lb (118.842 kg), SpO2 97 %. Gen: Alert, well appearing.  Patient is oriented to person, place, time, and situation. AFFECT: pleasant, lucid thought and speech. No further exam today.  LABS:  Lab Results  Component Value Date   TSH 1.13 12/19/2014   Lab Results  Component Value Date   WBC 5.2 12/19/2014   HGB 13.4 12/19/2014   HCT 40.8 12/19/2014   MCV 91.7 12/19/2014   PLT 215.0 12/19/2014   Lab Results  Component Value Date   CREATININE 0.65 12/19/2014   BUN 11 12/19/2014   NA 140 12/19/2014   K 4.3 12/19/2014   CL 103 12/19/2014    CO2 28 12/19/2014   Lab Results  Component Value Date   ALT 28 12/19/2014   AST 18 12/19/2014   ALKPHOS 82 12/19/2014   BILITOT 0.4 12/19/2014   Lab Results  Component Value Date   CHOL 140 12/19/2014   Lab Results  Component Value Date   HDL  33.90* 12/19/2014   Lab Results  Component Value Date   LDLCALC 49 04/18/2014   Lab Results  Component Value Date   TRIG 214.0* 12/19/2014   Lab Results  Component Value Date   CHOLHDL 4 12/19/2014   Lab Results  Component Value Date   HGBA1C 8.2* 11/17/2014     IMPRESSION AND PLAN:  1) Anxiety and depression: stable, continue counseling, no med changes at this time.  2) Abnormal wt loss: stabilized over the last mo (up 2 lbs).  No red flags on exam/labs/history. She does have long hx of intermittent bouts with IBS-like symptoms, which are currently ongoing.  3) Hemorrhoids, external by her description.  Will try Anusol HC 2.5% cream bid.  An After Visit Summary was printed and given to the patient.  FOLLOW UP: Return in about 2 months (around 03/24/2015) for annual CPE (fasting).

## 2015-01-22 NOTE — Progress Notes (Signed)
Pre visit review using our clinic review tool, if applicable. No additional management support is needed unless otherwise documented below in the visit note. 

## 2015-01-30 ENCOUNTER — Other Ambulatory Visit: Payer: Self-pay | Admitting: *Deleted

## 2015-01-30 MED ORDER — LAMOTRIGINE 150 MG PO TABS: 150.0000 mg | ORAL_TABLET | Freq: Every day | ORAL | Status: DC

## 2015-01-30 MED ORDER — METOPROLOL TARTRATE 25 MG PO TABS: ORAL_TABLET | ORAL | Status: DC

## 2015-01-30 NOTE — Telephone Encounter (Signed)
LOV: 01/22/15 NOV: 03/30/15  RF request for lamotrigine Last written: 07/24/14 #90 w/ 1RF  RF request for metoprolol Last written: 07/24/14 #180 w/ 1RF

## 2015-02-08 ENCOUNTER — Other Ambulatory Visit: Payer: Self-pay | Admitting: Family Medicine

## 2015-02-08 MED ORDER — METFORMIN HCL 1000 MG PO TABS: ORAL_TABLET | ORAL | Status: DC

## 2015-02-21 ENCOUNTER — Other Ambulatory Visit: Payer: Self-pay | Admitting: *Deleted

## 2015-02-21 MED ORDER — HYDROCODONE-ACETAMINOPHEN 5-325 MG PO TABS: ORAL_TABLET | ORAL | Status: DC

## 2015-02-21 NOTE — Telephone Encounter (Signed)
Pt LMOM on 02/21/15 at 9:52am  RF request for hydrocodone LOV: 01/22/15 Next ov: 03/30/15 Last written: 01/12/15 #120 w/ 0RF  Please advise. Thanks.

## 2015-02-26 ENCOUNTER — Other Ambulatory Visit: Payer: Self-pay | Admitting: *Deleted

## 2015-02-26 MED ORDER — EMPAGLIFLOZIN 10 MG PO TABS: 10.0000 mg | ORAL_TABLET | Freq: Every day | ORAL | Status: DC

## 2015-02-26 MED ORDER — GLIPIZIDE ER 5 MG PO TB24: 10.0000 mg | ORAL_TABLET | Freq: Every day | ORAL | Status: DC

## 2015-02-26 NOTE — Telephone Encounter (Signed)
LOV: 01/22/15 NOV: 03/30/15  RF request for glipizide Last written: 10/30/14 #60 w/ 3Rf  RF request for jardiance Last written: 10/30/14 #30 w/ Sherlean Foot

## 2015-03-30 ENCOUNTER — Encounter: Payer: Self-pay | Admitting: Family Medicine

## 2015-03-30 ENCOUNTER — Ambulatory Visit (INDEPENDENT_AMBULATORY_CARE_PROVIDER_SITE_OTHER): Payer: PRIVATE HEALTH INSURANCE | Admitting: Family Medicine

## 2015-03-30 VITALS — BP 120/78 | HR 80 | Temp 97.9°F | Resp 16 | Ht 65.25 in | Wt 257.0 lb

## 2015-03-30 DIAGNOSIS — H6013 Cellulitis of external ear, bilateral: Secondary | ICD-10-CM | POA: Diagnosis not present

## 2015-03-30 DIAGNOSIS — Z23 Encounter for immunization: Secondary | ICD-10-CM | POA: Diagnosis not present

## 2015-03-30 DIAGNOSIS — Z0000 Encounter for general adult medical examination without abnormal findings: Secondary | ICD-10-CM | POA: Diagnosis not present

## 2015-03-30 DIAGNOSIS — Z1211 Encounter for screening for malignant neoplasm of colon: Secondary | ICD-10-CM

## 2015-03-30 LAB — FECAL OCCULT BLOOD, GUAIAC: Fecal Occult Blood: NEGATIVE

## 2015-03-30 MED ORDER — CEPHALEXIN 500 MG PO CAPS: 500.0000 mg | ORAL_CAPSULE | Freq: Three times a day (TID) | ORAL | Status: DC

## 2015-03-30 MED ORDER — HYDROCODONE-ACETAMINOPHEN 5-325 MG PO TABS: ORAL_TABLET | ORAL | Status: DC

## 2015-03-30 NOTE — Progress Notes (Signed)
Office Note 04/06/2015  CC:  Chief Complaint  Patient presents with  . Annual Exam    Pt is fasting.     HPI:  Kristi Guerrero is a 54 y.o. White female who is here for annual health maintenance exam. Had labs about 2 mo ago that showed mild elevation of trigs, low HDL, o/w normal.  A1c was up--her DM is being managed by Dr. Cruzita Lederer.  Has f/u with her in 05/2015.  She is due for f/u with her GYN.  Has been busy lately, mom died pretty recently.  Upper parts of external ears hurt/red lately since getting ears pierced there recently.   Past Medical History  Diagnosis Date  . DM2 (diabetes mellitus, type 2) (HCC)     +hx of GDM both pregnancies, dx'd with DM 2 in her 00K--HTXHFS hx of complications  with eyes, kidneys, nerves, or CV system.  Marland Kitchen Hypertension   . Depression     MDD and borderline PD are her two main psych dx as per [facility] records--these records were handwritten and I could not decipher the script so I got no other helpful info beyond these two dx.  . Borderline personality disorder   . DDD (degenerative disc disease), lumbar     Pt says she has hx of herniated disc that has resulted in some numbness in a few toes on left foot.  . Carpal tunnel syndrome on both sides   . Hyperlipidemia   . Obesity   . Daytime somnolence     Nuvigil rx'd by her psychiatrist.  Pt reports that no sleep study has been done.  . Iron deficiency anemia 2013    Likely occult upper GI bleeding.  Hb 8.6--came up to 12 at most recent check after getting on integra, which she now continues once daily.   iFOB was negative 01/13/12.  She says she had been taking excessive NSAIDs at the time.  She has never had a colonoscopy.  . Multinodular goiter     Hx of multiple benign biopsies; repeated u/s's showed no change until the one on 04/2014.  FNA 06/2013 showed R and isthmus nodules intermediate so f/u bx planned for 6 mo (around July 2016)  . Anxiety     + ?mood disorder  (awaiting old psych records as of 12/19/11.  . Low back pain 12/19/2011  . Abnormal nuclear stress test 12/31/11    Low risk lexiscan, but left heart cath was recommended and this showed normal coronary arteries and normal LV function. (01/01/12)  . History of vitamin D deficiency 2009    Past Surgical History  Procedure Laterality Date  . Cesarean section      X 2  . Fna bx thyroid nodules  06/29/14    x 4: some findings to support benign cyst and some "cellular atypica of indeterminate significance".--followed by Dr. Cruzita Lederer.  Plan for repeat bx around July 2016  . Cardiac catheterization  01/01/12    Normal coronaries and normal LV function.    Family History  Problem Relation Age of Onset  . Cancer Mother     breast and follicular thyroid cancer  . Depression Mother   . Diabetes Father   . Alcohol abuse Sister   . Hypertension Maternal Grandmother   . Hyperlipidemia Maternal Grandmother   . Heart disease Maternal Grandmother   . Stroke Maternal Grandmother   . Cancer Maternal Grandfather     colon cancer  . Stroke Maternal Grandfather   . Hypertension  Maternal Grandfather   . Hyperlipidemia Maternal Grandfather   . Heart disease Maternal Grandfather     Social History   Social History  . Marital Status: Divorced    Spouse Name: N/A  . Number of Children: N/A  . Years of Education: N/A   Occupational History  . Not on file.   Social History Main Topics  . Smoking status: Never Smoker   . Smokeless tobacco: Never Used  . Alcohol Use: No  . Drug Use: No  . Sexual Activity: Not on file   Other Topics Concern  . Not on file   Social History Narrative   Divorced, 2 grown children (one in [place] and one in [place]).  One grandchild.   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   No T/A/Ds.   Exercise: occ goes to the [facility].   Currently living in [place], [place].    Outpatient Prescriptions Prior to Visit  Medication Sig Dispense Refill  .  aspirin 81 MG tablet Take 81 mg by mouth daily.    Marland Kitchen atorvastatin (LIPITOR) 40 MG tablet Take 1 tablet (40 mg total) by mouth daily. 30 tablet 5  . Blood Glucose Monitoring Suppl (ACCU-CHEK AVIVA CONNECT) W/DEVICE KIT 1 kit by Does not apply route 2 (two) times daily. 1 kit 0  . busPIRone (BUSPAR) 5 MG tablet TAKE 1/2 TABLET BY MOUTH THREE TIMES A DAY 90 tablet 1  . butalbital-acetaminophen-caffeine (FIORICET, ESGIC) 50-325-40 MG per tablet 1-2 tabs po bid prn HA 60 tablet 1  . empagliflozin (JARDIANCE) 10 MG TABS tablet Take 10 mg by mouth daily. 30 tablet 3  . ferrous sulfate 325 (65 FE) MG tablet Take 325 mg by mouth daily with breakfast.    . FLUoxetine (PROZAC) 40 MG capsule TAKE ONE CAPSULE BY MOUTH ONCE DAILY 90 capsule 3  . glipiZIDE (GLIPIZIDE XL) 5 MG 24 hr tablet Take 2 tablets (10 mg total) by mouth daily with breakfast. 60 tablet 3  . hydrocortisone (ANUSOL-HC) 2.5 % rectal cream Place 1 application rectally 2 (two) times daily. 35 g 3  . Insulin Pen Needle 32G X 8 MM MISC Use as directed for victoza. 4 each 6  . lamoTRIgine (LAMICTAL) 150 MG tablet Take 1 tablet (150 mg total) by mouth daily. 90 tablet 1  . lisinopril (PRINIVIL,ZESTRIL) 40 MG tablet Take 1 tablet (40 mg total) by mouth daily. 90 tablet 1  . LORazepam (ATIVAN) 0.5 MG tablet take 1 tablet by mouth twice a day if needed 60 tablet 5  . metFORMIN (GLUCOPHAGE) 1000 MG tablet 1 tab po bid 180 tablet 1  . metoprolol tartrate (LOPRESSOR) 25 MG tablet take 1 tablet by mouth twice a day 180 tablet 1  . Multiple Vitamin (MULTIVITAMIN) tablet Take 1 tablet by mouth daily.    . Multiple Vitamins-Minerals (PRESERVISION AREDS) CAPS Take 1 capsule by mouth daily.    . Omega-3 Fatty Acids (FISH OIL) 1000 MG CAPS Take 1 capsule by mouth at bedtime.    Marland Kitchen omeprazole (PRILOSEC) 40 MG capsule Take 1 capsule (40 mg total) by mouth daily. 30 capsule 3  . HYDROcodone-acetaminophen (NORCO/VICODIN) 5-325 MG per tablet TAKE 1 TO 2 TABLETS EVERY  6 HOURS AS NEEDED FOR PAIN 120 tablet 0   No facility-administered medications prior to visit.    Allergies  Allergen Reactions  . Levaquin [Levofloxacin In D5w] Hives  . Penicillins     Blistery rash  . Victoza [Liraglutide] Nausea Only  ROS Review of Systems  Constitutional: Negative for fever, chills, appetite change and fatigue.  HENT: Negative for congestion, dental problem, ear pain and sore throat.   Eyes: Negative for discharge, redness and visual disturbance.  Respiratory: Negative for cough, chest tightness, shortness of breath and wheezing.   Cardiovascular: Negative for chest pain, palpitations and leg swelling.  Gastrointestinal: Positive for diarrhea (intermittent postprandial--responds to imodium prn). Negative for nausea, vomiting, abdominal pain and blood in stool.  Genitourinary: Negative for dysuria, urgency, frequency, hematuria, flank pain and difficulty urinating.  Musculoskeletal: Negative for myalgias, back pain, joint swelling, arthralgias and neck stiffness.  Skin: Negative for pallor and rash.  Neurological: Negative for dizziness, speech difficulty, weakness and headaches.  Hematological: Negative for adenopathy. Does not bruise/bleed easily.  Psychiatric/Behavioral: Negative for confusion and sleep disturbance. The patient is not nervous/anxious.     PE; Blood pressure 120/78, pulse 80, temperature 97.9 F (36.6 C), temperature source Oral, resp. rate 16, height 5' 5.25" (1.657 m), weight 257 lb (116.574 kg), SpO2 97 %. Gen: Alert, well appearing.  Patient is oriented to person, place, time, and situation. AFFECT: pleasant, lucid thought and speech. ENT: Ears: EACs clear, normal epithelium.  Upper aspects of both pinnas are mildly erythematous, mildly tender and mildly swollen. TMs with good light reflex and landmarks bilaterally.  Eyes: no injection, icteris, swelling, or exudate.  EOMI, PERRLA. Nose: no drainage or turbinate edema/swelling.  No  injection or focal lesion.  Mouth: lips without lesion/swelling.  Oral mucosa pink and moist.  Dentition intact and without obvious caries or gingival swelling.  Oropharynx without erythema, exudate, or swelling.  Neck: supple/nontender.  No LAD, mass, or TM.  Carotid pulses 2+ bilaterally, without bruits. CV: RRR, no m/r/g.   LUNGS: CTA bilat, nonlabored resps, good aeration in all lung fields. ABD: soft, NT, ND, BS normal.  No hepatospenomegaly or mass.  No bruits. EXT: no clubbing, cyanosis, or edema.  Musculoskeletal: no joint swelling, erythema, warmth, or tenderness.  ROM of all joints intact. Skin - no sores or suspicious lesions or rashes or color changes  Pertinent labs:  Lab Results  Component Value Date   TSH 1.13 12/19/2014   Lab Results  Component Value Date   WBC 5.2 12/19/2014   HGB 13.4 12/19/2014   HCT 40.8 12/19/2014   MCV 91.7 12/19/2014   PLT 215.0 12/19/2014   Lab Results  Component Value Date   CREATININE 0.65 12/19/2014   BUN 11 12/19/2014   NA 140 12/19/2014   K 4.3 12/19/2014   CL 103 12/19/2014   CO2 28 12/19/2014   Lab Results  Component Value Date   ALT 28 12/19/2014   AST 18 12/19/2014   ALKPHOS 82 12/19/2014   BILITOT 0.4 12/19/2014   Lab Results  Component Value Date   CHOL 140 12/19/2014   Lab Results  Component Value Date   HDL 33.90* 12/19/2014   Lab Results  Component Value Date   LDLCALC 49 04/18/2014   Lab Results  Component Value Date   TRIG 214.0* 12/19/2014   Lab Results  Component Value Date   CHOLHDL 4 12/19/2014   Lab Results  Component Value Date   HGBA1C 8.2* 11/17/2014    ASSESSMENT AND PLAN:   1) Health maintenance exam: Reviewed age and gender appropriate health maintenance issues (prudent diet, regular exercise, health risks of tobacco and excessive alcohol, use of seatbelts, fire alarms in home, use of sunscreen).  Also reviewed age and gender appropriate health screening  as well as vaccine  recommendations. Flu vaccine today. Colon ca screening: she declines colonoscopy but did accept hemoccult ICT (iFOB) testing kit today. For her chronic pain, I did give a rx today for her vicodin 5/325, 1-2 q6h prn, #120, fill on/after 04/17/15. She'll be arranging routine annual care with her GYN soon.  2) Cellulitis of external ear, bilateral: keflex 581m tid x 7d, leave ear rings out.  An After Visit Summary was printed and given to the patient.  FOLLOW UP:  Return in about 4 months (around 07/31/2015) for routine chronic illness f/u.

## 2015-03-30 NOTE — Progress Notes (Signed)
Pre visit review using our clinic review tool, if applicable. No additional management support is needed unless otherwise documented below in the visit note. 

## 2015-04-12 ENCOUNTER — Telehealth: Payer: Self-pay | Admitting: Family Medicine

## 2015-04-12 MED ORDER — CEPHALEXIN 500 MG PO CAPS: 500.0000 mg | ORAL_CAPSULE | Freq: Three times a day (TID) | ORAL | Status: DC

## 2015-04-12 NOTE — Telephone Encounter (Signed)
Please advise. Thanks.  

## 2015-04-12 NOTE — Telephone Encounter (Signed)
OK, keflex rx sent in as per pt request.

## 2015-04-12 NOTE — Telephone Encounter (Signed)
Pts. Ear is infected again from where she got her piercing. Can she have another antibiotic to clear it up?

## 2015-04-12 NOTE — Telephone Encounter (Signed)
Make sure the redness and pain went away completely with the antibiotic I gave her for this recently. Also, is she still trying to put ear rings in the place that has been infected?  If she is tell her to stop and let those holes fill in.-thx

## 2015-04-12 NOTE — Telephone Encounter (Signed)
Pt stated that the antibiotic helped and redness and pain whent away but she recently tried putting ear rings back in her ear. She was advised to stop and let the holes fill in. She did ask if Dr. Milinda CaveMcGowen would send in an antibiotic. Please advise. Thanks.

## 2015-04-13 NOTE — Telephone Encounter (Signed)
Tried calling pt NA and unable to leave a message.  

## 2015-04-17 NOTE — Telephone Encounter (Signed)
Pt has p/u Rx.  

## 2015-05-02 ENCOUNTER — Other Ambulatory Visit: Payer: Self-pay | Admitting: *Deleted

## 2015-05-02 MED ORDER — BUSPIRONE HCL 5 MG PO TABS: ORAL_TABLET | ORAL | Status: DC

## 2015-05-02 NOTE — Telephone Encounter (Signed)
RF request for Buspirone LOV: 03/30/15 Next ov: 08/01/15 Last written: 12/28/14 #90 w/ 1RF

## 2015-05-16 ENCOUNTER — Other Ambulatory Visit: Payer: Self-pay | Admitting: *Deleted

## 2015-05-16 MED ORDER — OMEPRAZOLE 40 MG PO CPDR: 40.0000 mg | DELAYED_RELEASE_CAPSULE | Freq: Every day | ORAL | Status: DC

## 2015-05-16 NOTE — Telephone Encounter (Signed)
RF request for omeprazole LOV: 01/22/15 Next ov: 07/31/14 Last written: 10/23/14 #30 w/ 3RF

## 2015-06-02 ENCOUNTER — Other Ambulatory Visit: Payer: Self-pay | Admitting: Family Medicine

## 2015-06-04 NOTE — Telephone Encounter (Signed)
Rx faxed

## 2015-06-04 NOTE — Telephone Encounter (Signed)
RF request for Lorazepam LOV: 03/30/15 Next ov: 08/01/15 Last written: 11/30/14 #60 w/ 5RF  Please advise. Thanks.

## 2015-06-05 ENCOUNTER — Other Ambulatory Visit: Payer: Self-pay | Admitting: Family Medicine

## 2015-06-05 MED ORDER — HYDROCODONE-ACETAMINOPHEN 5-325 MG PO TABS: ORAL_TABLET | ORAL | Status: DC

## 2015-06-05 NOTE — Telephone Encounter (Signed)
Vicodin Rx, patient okay with [facility] picking up Rx.

## 2015-06-05 NOTE — Telephone Encounter (Signed)
Pt advised and voiced understanding.  Rx put up front for p/u by [facility].

## 2015-06-05 NOTE — Telephone Encounter (Signed)
RF request for hydro/APAP LOV: 03/30/15 Next ov: 08/01/15 Last written: 03/30/15 #120 w/ 0RF  Please advise. Thanks.

## 2015-06-13 ENCOUNTER — Encounter: Payer: Self-pay | Admitting: Internal Medicine

## 2015-06-13 ENCOUNTER — Ambulatory Visit (INDEPENDENT_AMBULATORY_CARE_PROVIDER_SITE_OTHER): Payer: PRIVATE HEALTH INSURANCE | Admitting: Internal Medicine

## 2015-06-13 VITALS — BP 130/70 | HR 59 | Temp 98.1°F | Wt 258.0 lb

## 2015-06-13 DIAGNOSIS — E042 Nontoxic multinodular goiter: Secondary | ICD-10-CM | POA: Diagnosis not present

## 2015-06-13 DIAGNOSIS — IMO0001 Reserved for inherently not codable concepts without codable children: Secondary | ICD-10-CM

## 2015-06-13 DIAGNOSIS — E1165 Type 2 diabetes mellitus with hyperglycemia: Secondary | ICD-10-CM

## 2015-06-13 LAB — POCT GLYCOSYLATED HEMOGLOBIN (HGB A1C): HEMOGLOBIN A1C: 6.7

## 2015-06-13 NOTE — Progress Notes (Addendum)
Patient ID: Kristi Guerrero, female   DOB: July 27, 1960, 54 y.o.   MRN: [id number]    HPI  Kristi Guerrero is a 54 y.o.-year-old female, returning for f/u for MNG and now DM2. Last visit 11 mo ago.  MNG: Pt was dx with thyroid nodules 10 years ago >> saw endocrinology in [place].  Thyroid U/S (04/20/2014) - 4 nodules, 2 enlarged from previous U/S in 2006:  Right thyroid lobe: 5.5 x 2.6 x 3.3 cm. Complex interpolar region nodule measures 3.0 x 1.9 x 2.8 cm. It is predominately solid with asmall cystic area. On the previous report, this was described to be 1.5 x 1.1 x 1.7 cm.   Left thyroid lobe: 5.4 x 2.8 x 2.5 cm. 2.8 x 2.2 x 2.3 cm interpolar region solid heterogeneous nodule. This was previously described to measure 1.7 x 0.9 x 1.8 cm. Smaller lower pole nodule measures 1.5 x 1.4 x 1.3 cm there are some peripheral calcifications.   Isthmus Thickness: 4 mm. Solid 2.7 x 1.6 x 2.3 cm heterogeneous nodule. On the previous report, measurements of this nodule were 2.7 x 2.4 x 1.7 cm.   Lymphadenopathy: None visualized.  We Bx'ed the 4 nodules on 07/01/2014: - 2 x FLUS - 2 x benign  I reviewed pt's thyroid tests: Lab Results  Component Value Date   TSH 1.13 12/19/2014   TSH 1.86 03/06/2014   TSH 1.294 03/31/2013   TSH 0.99 12/19/2011   FREET4 0.76 12/19/2011    Pt denies feeling nodules in neck, hoarseness, dysphagia/odynophagia, SOB with lying down.  Pt does have a + FH of follicular thyroid cancer in mother.   DM2: - had GDM x 2 - dx in 1990s  Last HbA1c:  Lab Results  Component Value Date   HGBA1C 8.2* 11/17/2014   HGBA1C 8.4* 06/19/2014   HGBA1C 8.0* 03/06/2014   She was on: - Glipizide XL 15 mg in am - Actos 45 mg in am - Metformin 1000 mg 2x a day Tried Januvia >> expensive. Tried Byetta >> did not work well.  Tried Victoza >> nausea.  She is now on: - Jardiance 10 mg in am. - Metformin 1000 mg 2x a day with meals. - Glipizide XL 10 mg in am.  Checks sugars  rarely. From last visit: - am: 120-180 - 2h after b'fast: n/c - lunch: 120s - 2h after lunch: n/c - dinner: 112-130s - 2h after dinner: n/c - bedtime: 180s ? Hypoglycemia awareness.  No CKD. Last BUN/Cr: Lab Results  Component Value Date   BUN 11 12/19/2014   Lab Results  Component Value Date   CREATININE 0.65 12/19/2014  On Lisinopril.  No HL. Last Lipid panel: Lab Results  Component Value Date   CHOL 140 12/19/2014   HDL 33.90* 12/19/2014   LDLCALC 49 04/18/2014   LDLDIRECT 75.0 12/19/2014   TRIG 214.0* 12/19/2014   CHOLHDL 4 12/19/2014  On Lipitor.  Last eye exam: 2015. No DR.  No neuropathy. Has a herniated disk >> numbness in toes since ~2009. Last foot exam: PCP.  ROS: Constitutional: no weight gain/loss, + fatigue, no subjective hyperthermia/hypothermia Eyes: no blurry vision, no xerophthalmia ENT: no sore throat, no nodules palpated in throat, no dysphagia/odynophagia, no hoarseness Cardiovascular: no CP/SOB/palpitations/leg swelling Respiratory: no cough/SOB Gastrointestinal: no N/V/D/C Musculoskeletal: + muscle/no joint aches Skin: no rashes Neurological: no tremors/numbness/tingling/dizziness, + HA  I reviewed pt's medications, allergies, PMH, social hx, family hx, and changes were documented in the history of present illness. Otherwise, unchanged  from my initial visit note:   Past Medical History  Diagnosis Date  . DM2 (diabetes mellitus, type 2) (HCC)     +hx of GDM both pregnancies, dx'd with DM 2 in her 22Q--MGNOIB hx of complications  with eyes, kidneys, nerves, or CV system.  Marland Kitchen Hypertension   . Depression     MDD and borderline PD are her two main psych dx as per [facility] records--these records were handwritten and I could not decipher the script so I got no other helpful info beyond these two dx.  . Borderline personality disorder   . DDD (degenerative disc disease), lumbar     Pt says she has hx of herniated disc that  has resulted in some numbness in a few toes on left foot.  . Carpal tunnel syndrome on both sides   . Hyperlipidemia   . Obesity   . Daytime somnolence     Nuvigil rx'd by her psychiatrist.  Pt reports that no sleep study has been done.  . Iron deficiency anemia 2013    Likely occult upper GI bleeding.  Hb 8.6--came up to 12 at most recent check after getting on integra, which she now continues once daily.   iFOB was negative 01/13/12.  She says she had been taking excessive NSAIDs at the time.  She has never had a colonoscopy.  . Multinodular goiter     Hx of multiple benign biopsies; repeated u/s's showed no change until the one on 04/2014.  FNA 06/2013 showed R and isthmus nodules intermediate so f/u bx planned for 6 mo (around July 2016)  . Anxiety     + ?mood disorder (awaiting old psych records as of 12/19/11.  . Low back pain 12/19/2011  . Abnormal nuclear stress test 12/31/11    Low risk lexiscan, but left heart cath was recommended and this showed normal coronary arteries and normal LV function. (01/01/12)  . History of vitamin D deficiency 2009   Past Surgical History  Procedure Laterality Date  . Cesarean section      X 2  . Fna bx thyroid nodules  06/29/14    x 4: some findings to support benign cyst and some "cellular atypica of indeterminate significance".--followed by Dr. Cruzita Lederer.  Plan for repeat bx around July 2016  . Cardiac catheterization  01/01/12    Normal coronaries and normal LV function.   History   Social History Main Topics  . Smoking status: Never Smoker   . Smokeless tobacco: Never Used  . Alcohol Use: No  . Drug Use: No   Social History Narrative   Divorced, 2 grown children (one in [place] and one in [place]).  One grandchild.   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   No T/A/Ds.   Exercise: occ goes to the [facility].   Currently living in [place], [place].   Current Outpatient Prescriptions on File Prior to Visit  Medication Sig Dispense  Refill  . aspirin 81 MG tablet Take 81 mg by mouth daily.    Marland Kitchen atorvastatin (LIPITOR) 40 MG tablet Take 1 tablet (40 mg total) by mouth daily. 30 tablet 5  . Blood Glucose Monitoring Suppl (ACCU-CHEK AVIVA CONNECT) W/DEVICE KIT 1 kit by Does not apply route 2 (two) times daily. 1 kit 0  . busPIRone (BUSPAR) 5 MG tablet TAKE 1/2 TABLET BY MOUTH THREE TIMES A DAY 90 tablet 1  . butalbital-acetaminophen-caffeine (FIORICET, ESGIC) 50-325-40 MG per tablet 1-2 tabs po bid  prn HA 60 tablet 1  . cephALEXin (KEFLEX) 500 MG capsule Take 1 capsule (500 mg total) by mouth 3 (three) times daily. 21 capsule 0  . empagliflozin (JARDIANCE) 10 MG TABS tablet Take 10 mg by mouth daily. 30 tablet 3  . ferrous sulfate 325 (65 FE) MG tablet Take 325 mg by mouth daily with breakfast.    . FLUoxetine (PROZAC) 40 MG capsule TAKE ONE CAPSULE BY MOUTH ONCE DAILY 90 capsule 3  . glipiZIDE (GLIPIZIDE XL) 5 MG 24 hr tablet Take 2 tablets (10 mg total) by mouth daily with breakfast. 60 tablet 3  . HYDROcodone-acetaminophen (NORCO/VICODIN) 5-325 MG tablet TAKE 1 TO 2 TABLETS EVERY 6 HOURS AS NEEDED FOR PAIN 120 tablet 0  . hydrocortisone (ANUSOL-HC) 2.5 % rectal cream Place 1 application rectally 2 (two) times daily. 35 g 3  . Insulin Pen Needle 32G X 8 MM MISC Use as directed for victoza. 4 each 6  . lamoTRIgine (LAMICTAL) 150 MG tablet Take 1 tablet (150 mg total) by mouth daily. 90 tablet 1  . lisinopril (PRINIVIL,ZESTRIL) 40 MG tablet Take 1 tablet (40 mg total) by mouth daily. 90 tablet 1  . LORazepam (ATIVAN) 0.5 MG tablet take 1 tablet by mouth twice a day 60 tablet 5  . metFORMIN (GLUCOPHAGE) 1000 MG tablet 1 tab po bid 180 tablet 1  . metoprolol tartrate (LOPRESSOR) 25 MG tablet take 1 tablet by mouth twice a day 180 tablet 1  . Multiple Vitamin (MULTIVITAMIN) tablet Take 1 tablet by mouth daily.    . Multiple Vitamins-Minerals (PRESERVISION AREDS) CAPS Take 1 capsule by mouth daily.    . Omega-3 Fatty Acids (FISH  OIL) 1000 MG CAPS Take 1 capsule by mouth at bedtime.    Marland Kitchen omeprazole (PRILOSEC) 40 MG capsule Take 1 capsule (40 mg total) by mouth daily. 30 capsule 11   No current facility-administered medications on file prior to visit.   Allergies  Allergen Reactions  . Levaquin [Levofloxacin In D5w] Hives  . Penicillins     Blistery rash  . Victoza [Liraglutide] Nausea Only   Family History  Problem Relation Age of Onset  . Cancer Mother     breast and follicular thyroid cancer  . Depression Mother   . Diabetes Father   . Alcohol abuse Sister   . Hypertension Maternal Grandmother   . Hyperlipidemia Maternal Grandmother   . Heart disease Maternal Grandmother   . Stroke Maternal Grandmother   . Cancer Maternal Grandfather     colon cancer  . Stroke Maternal Grandfather   . Hypertension Maternal Grandfather   . Hyperlipidemia Maternal Grandfather   . Heart disease Maternal Grandfather    PE: BP 130/70 mmHg  Pulse 59  Temp(Src) 98.1 F (36.7 C)  Wt 258 lb (117.028 kg) Body mass index is 42.62 kg/(m^2). Wt Readings from Last 3 Encounters:  06/13/15 258 lb (117.028 kg)  03/30/15 257 lb (116.574 kg)  01/22/15 262 lb (118.842 kg)   Constitutional: overweight, in NAD Eyes: PERRLA, EOMI, no exophthalmos ENT: moist mucous membranes, + thyromegaly - R thyroid fullness and palpable isthmic thyroid nodule, no cervical lymphadenopathy Cardiovascular: RRR, No MRG Respiratory: CTA B Gastrointestinal: abdomen soft, NT, ND, BS+ Musculoskeletal: no deformities, strength intact in all 4;  Skin: moist, warm, no rashes Neurological: no tremor with outstretched hands, DTR normal in all 4  ASSESSMENT: 1. MNG - thyroid U/S (04/20/2014):   Adequacy Reason Satisfactory For Evaluation. Diagnosis THYROID, FINE NEEDLE ASPIRATION ISTHMUS, (SPECIMEN 1  OF 4, COLLECTED ON 06/29/2014) ATYPIA OF UNDETERMINED SIGNIFICANCE OR FOLLICULAR LESION OF UNDETERMINED SIGNIFICANCE (BETHESDA CATEGORY III). SEE  COMMENT. COMMENT: THE SPECIMEN CONSISTS OF SMALL AND MEDIUM SIZED GROUPS OF FOLLICULAR EPITHELIAL CELLS AND SOME BACKGROUND COLLOID. SOME OF THE GROUPS OF CELLS ARE ARRANGED AS MICROFOLLICLES. THERE ARE SCATTERED INTRANUCLEAR GROOVES. BASED ON THESE FEATURES, A FOLLICULAR LESION/NEOPLASM CAN NOT BE ENTIRELY RULED OUT. Enid Cutter MD Pathologist, Electronic Signature (Case signed 06/30/2014) Specimen Clinical Information Solid 2.7 x 1.6 x 2.3cm heterogeneous nodule Source Thyroid, Fine Needle Aspiration, Isthmus, (Specimen 1 of 4, collected on 06/29/2014)   Adequacy Reason Satisfactory For Evaluation. Diagnosis THYROID, FINE NEEDLE ASPIRATION (SPECIMEN 2 OF 4 COLLECTED 06-29-2014) ATYPIA OF UNDETERMINED SIGNIFICANCE OR FOLLICULAR LESION OF UNDETERMINED SIGNIFICANCE (BETHESDA CATEGORY III). SEE COMMENT. COMMENT: THE SPECIMEN IS SOMEWHAT HYPOCELLULAR, HINDERING OPTIMAL CYTOLOGIC EVALUATION. HOWEVER, THERE ARE SCATTERED SMALL GROUPS OF FOLLICULAR EPITHELIAL CELLS WITH MILD CYTOLOGIC ATYPIA, INCLUDING CONSPICUOUS INTRANUCLEAR GROOVES. BASED ON THESE FEATURES, A FOLLICULAR LESION/NEOPLASM CAN NOT BE ENTIRELY RULED OUT. Enid Cutter MD Pathologist, Electronic Signature (Case signed 06/30/2014) Specimen Clinical Information Right interpolar region nodule measures 3.0 x 1.9 x 2.8cm, It is predominantely solid with a small cystic area Source Thyroid, Fine Needle Aspiration, Right, (Specimen 2 of 4, collected on 06/29/2014)   Adequacy Reason Satisfactory For Evaluation. Diagnosis FINE NEEDLE ASPIRATION, THYROID, LLP (SPECIMEN 3 OF 4 COLLECTED ON 06/29/14): CONSISTENT WITH BENIGN FOLLICULAR NODULE (BETHESDA CATEGORY II). Enid Cutter MD Pathologist, Electronic Signature (Case signed 06/30/2014) Specimen Clinical Information Smaller llp nodule 1.5 x 1.4 x 1.3cm Source Thyroid, Fine Needle Aspiration, LLP, (Specimen 3 of 4, collected on 06/29/2014)   Adequacy Reason Satisfactory But  Limited For Evaluation, Scant Cellularity. Diagnosis THYROID, FINE NEEDLE ASPIRATION: LEFT INTERPOLAR (4 OF 4 COLLECTED ON 0/17/5102) SCANT FOLLICULAR EPITHELIUM PRESENT (BETHESDA CATEGORY I). FINDINGS CONSISTENT WITH THE CONTENTS OF A CYST. Enid Cutter MD Pathologist, Electronic Signature (Case signed 06/30/2014) Specimen Clinical Information 2.8 x 2.2 x 2.3cm interpolar region solid heterogeneous nodule Source Thyroid, Fine Needle Aspiration, Left Interpolar, (Specimen 4 of 4, collected on 06/29/2014)  1. DM2, non-insulin-dependent, uncontrolled, without complications  PLAN: 1. MNG  - we again discussed about the FNA results - explained that the right and isthmic thyroid nodules are intermediate (FLUS) and the recommendation is to repeat the biopsy >> she agrees >> ordered the new U/S and the Bx'es   1. DM2 Patient with long-standing, uncontrolled diabetes, on oral antidiabetic regimen, but not checking sugars and presenting after a long absence. She is taking her meds consistently, though.  - we checked HbA1c now >> 6.7% (great!) - Please continue: Patient Instructions  Please continue: - Jardiance 10 mg in am. - Metformin 1000 mg 2x a day with meals. - Glipizide XL 10 mg in am.  Please check sugars 1-2x a day, rotating checks.  Please schedule an eye exam.  Please return in 1.5 months with your sugar log.   We will call you with the schedule of the thyroid U/S and biopsies.    - Strongly advised her to start checking sugars at different times of the day - check 1-2 times a day, rotating checks - advised for yearly eye exams >> she needs one - Return to clinic in 1.5 mo with sugar log  Addendum (07/27/2015): Adequacy Reason Satisfactory For Evaluation. Diagnosis THYROID, FINE NEEDLE ASPIRATION RIGHT MID POLE, (SPECIMEN 1 OF 2 COLLECTED 07/26/2015) CONSISTENT WITH BENIGN FOLLICULAR NODULE (BETHESDA CATEGORY II). Enid Cutter MD Pathologist, Electronic Signature (Case  signed 07/27/2015)  Specimen Clinical Information Previous biospy 06/29/14, Dominant 33 x 25 x 31 mm mostly solid nodule, right mid lobe (previously 30 x 19 x 28) Source Thyroid, Fine Needle Aspiration, RMP, (Specimen 1 of 2, collected on 07/26/2015)  Adequacy Reason Satisfactory For Evaluation. Diagnosis THYROID, FINE NEEDLE ASPIRATION, RIGHT ISTHMUS (SPECIMEN 2 OF 2 COLLECTED 07-26-2015) ATYPIA OF UNDETERMINED SIGNIFICANCE OR FOLLICULAR LESION OF UNDETERMINED SIGNIFICANCE (BETHESDA CATEGORY III). SEE COMMENT. COMMENT: THE SPECIMEN IS MILDLY CELLULAR AND CONSISTS OF SMALL AND MEDIUM SIZED GROUPS OF FOLLICULAR EPITHELIAL CELLS WITH MINIMAL CYTOLOGIC ATYPIA INCLUDING NUCLEAR OVERLAPPING. A SAMPLE WILL BE SENT FOR AFFIRMA TESTING AND THE RESULTS REPORTED SEPARATELY. Enid Cutter MD Pathologist, Electronic Signature (Case signed 07/27/2015) Specimen Clinical Information Dominant 27 x 17 x 23 mm slightly echogenic solid nodule, right of midline (previously 27 x 16 x 23) Source Thyroid, Fine Needle Aspiration, Right Isthmus, (Specimen 2 of 2, collected on 07/26/2015)  The R thyroid nodule is benign but the isthmic nodule is still a AUS/FLUS. Will await Afirma results.  08/08/2015 Addendum: Afirma result: BENIGN. (MTC negative). Excellent news!

## 2015-06-13 NOTE — Patient Instructions (Signed)
Please continue: - Jardiance 10 mg in am. - Metformin 1000 mg 2x a day with meals. - Glipizide XL 10 mg in am.  Please check sugars 1-2x a day, rotating checks.  Please schedule an eye exam.  Please return in 1.5 months with your sugar log.   We will call you with the schedule of the thyroid U/S and biopsies.

## 2015-06-15 ENCOUNTER — Encounter: Payer: Self-pay | Admitting: Family Medicine

## 2015-06-17 DIAGNOSIS — N281 Cyst of kidney, acquired: Secondary | ICD-10-CM

## 2015-06-17 HISTORY — DX: Cyst of kidney, acquired: N28.1

## 2015-07-02 ENCOUNTER — Ambulatory Visit
Admission: RE | Admit: 2015-07-02 | Discharge: 2015-07-02 | Disposition: A | Discharge status: Home / Self Care | Payer: PRIVATE HEALTH INSURANCE | Source: Ambulatory Visit | Attending: Internal Medicine | Admitting: Internal Medicine

## 2015-07-02 DIAGNOSIS — E042 Nontoxic multinodular goiter: Secondary | ICD-10-CM

## 2015-07-03 ENCOUNTER — Other Ambulatory Visit: Payer: Self-pay | Admitting: Internal Medicine

## 2015-07-03 ENCOUNTER — Telehealth: Payer: Self-pay | Admitting: Internal Medicine

## 2015-07-03 DIAGNOSIS — E042 Nontoxic multinodular goiter: Secondary | ICD-10-CM

## 2015-07-03 NOTE — Telephone Encounter (Signed)
Kristi Guerrero with [facility], lvm advising her that Dr Dr Elvera Lennox said yes to the biopsy.

## 2015-07-03 NOTE — Telephone Encounter (Signed)
Yes

## 2015-07-03 NOTE — Telephone Encounter (Signed)
Dondra Spry from [facility] state the ultra sound was done on patient, doctor would like to know if she still want the biopsy done, please advise [phone]

## 2015-07-04 ENCOUNTER — Other Ambulatory Visit: Payer: Self-pay | Admitting: Internal Medicine

## 2015-07-04 ENCOUNTER — Ambulatory Visit
Admission: RE | Admit: 2015-07-04 | Discharge: 2015-07-04 | Disposition: A | Discharge status: Home / Self Care | Payer: PRIVATE HEALTH INSURANCE | Source: Ambulatory Visit | Attending: Internal Medicine | Admitting: Internal Medicine

## 2015-07-04 ENCOUNTER — Other Ambulatory Visit: Payer: Self-pay | Admitting: Family Medicine

## 2015-07-04 DIAGNOSIS — E042 Nontoxic multinodular goiter: Secondary | ICD-10-CM

## 2015-07-04 NOTE — Telephone Encounter (Signed)
LOV: 03/30/15 NOV: 08/01/15  RF request for glipizide Last written: 02/26/15 #60 w/ 3RF  RF request for jardiance Last written: 02/26/15 #30 w/ Sherlean Foot

## 2015-07-13 ENCOUNTER — Other Ambulatory Visit: Payer: Self-pay | Admitting: Internal Medicine

## 2015-07-15 ENCOUNTER — Other Ambulatory Visit: Payer: Self-pay | Admitting: Family Medicine

## 2015-07-16 ENCOUNTER — Telehealth: Payer: Self-pay | Admitting: *Deleted

## 2015-07-19 ENCOUNTER — Ambulatory Visit (INDEPENDENT_AMBULATORY_CARE_PROVIDER_SITE_OTHER): Payer: PRIVATE HEALTH INSURANCE | Admitting: Family Medicine

## 2015-07-19 ENCOUNTER — Encounter: Payer: Self-pay | Admitting: Family Medicine

## 2015-07-19 VITALS — BP 171/89 | HR 59 | Temp 98.0°F | Resp 16 | Ht 62.25 in | Wt 268.5 lb

## 2015-07-19 DIAGNOSIS — I10 Essential (primary) hypertension: Secondary | ICD-10-CM | POA: Diagnosis not present

## 2015-07-19 LAB — BASIC METABOLIC PANEL
BUN: 8 mg/dL (ref 6–23)
CALCIUM: 8.8 mg/dL (ref 8.4–10.5)
CO2: 26 mEq/L (ref 19–32)
Chloride: 106 mEq/L (ref 96–112)
Creatinine, Ser: 0.62 mg/dL (ref 0.40–1.20)
GFR: 106.23 mL/min (ref >60.00)
Glucose, Bld: 160 mg/dL — ABNORMAL HIGH (ref 70–99)
POTASSIUM: 4 meq/L (ref 3.5–5.1)
SODIUM: 141 meq/L (ref 135–145)

## 2015-07-19 MED ORDER — HYDROCODONE-ACETAMINOPHEN 5-325 MG PO TABS
ORAL_TABLET | ORAL | Status: DC
Start: 2015-07-19 — End: 2015-08-28

## 2015-07-19 MED ORDER — HYDROCHLOROTHIAZIDE 25 MG PO TABS: 25.0000 mg | ORAL_TABLET | Freq: Every day | ORAL | Status: DC

## 2015-07-19 NOTE — Progress Notes (Signed)
Pre visit review using our clinic review tool, if applicable. No additional management support is needed unless otherwise documented below in the visit note. 

## 2015-07-19 NOTE — Progress Notes (Signed)
OFFICE VISIT  07/19/2015   CC:  Chief Complaint  Patient presents with  . Follow-up    HTN. Per pt BP has been elevated, recent high at home was 160/80.     HPI:    Patient is a 55 y.o. Caucasian female who presents for uncontrolled bp at home over the last week or so: 150s-190s over 80s.  One day she felt aweful, had bad HA and was sweating a lot, got very anxious.  No otc cold med/decongestant use.  Caffeine intake: some coffee, some tea, some coke.   Works a lot.  No exercise. Cries a lot/cries all the time lately.  Stopped seeing therapist b/c didn't seem to be helping Doroteo Glassman).  She currently does not see a psychiatrist.    ROS: no chest pain, no palpitations, no SOB, no vision complaints. Takes 1-2 fioricet per week, usually takes tylenol 1-2 times per day for HAs. Needs RF of her vicodin 5/325 today.  She does not use this med for her headaches.  Past Medical History  Diagnosis Date  . DM2 (diabetes mellitus, type 2) (HCC)     +hx of GDM both pregnancies, dx'd with DM 2 in her 28B--TDVVOH hx of complications  with eyes, kidneys, nerves, or CV system.  Marland Kitchen Hypertension   . Depression     MDD and borderline PD are her two main psych dx as per [facility] records--these records were handwritten and I could not decipher the script so I got no other helpful info beyond these two dx.  . Borderline personality disorder   . DDD (degenerative disc disease), lumbar     Pt says she has hx of herniated disc that has resulted in some numbness in a few toes on left foot.  . Carpal tunnel syndrome on both sides   . Hyperlipidemia   . Obesity   . Daytime somnolence     Nuvigil rx'd by her psychiatrist.  Pt reports that no sleep study has been done.  . Iron deficiency anemia 2013    Likely occult upper GI bleeding.  Hb 8.6--came up to 12 at most recent check after getting on integra, which she now continues once daily.   iFOB was negative 01/13/12.  She says she had  been taking excessive NSAIDs at the time.  She has never had a colonoscopy.  . Multinodular goiter     Hx of multiple benign biopsies; repeated u/s's showed no change until the one on 04/2014.  FNA 06/2013 showed R and isthmus nodules intermediate so f/u bx 6 mo recommended.  This repeat bx is planned as of endo f/u 05/2015.  Marland Kitchen Anxiety     + ?mood disorder (awaiting old psych records as of 12/19/11.  . Low back pain 12/19/2011  . Abnormal nuclear stress test 12/31/11    Low risk lexiscan, but left heart cath was recommended and this showed normal coronary arteries and normal LV function. (01/01/12)  . History of vitamin D deficiency 2009    Past Surgical History  Procedure Laterality Date  . Cesarean section      X 2  . Fna bx thyroid nodules  06/29/14    x 4: some findings to support benign cyst and some "cellular atypica of indeterminate significance".--followed by Dr. Cruzita Lederer.  Plan for repeat bx around July 2016  . Cardiac catheterization  01/01/12    Normal coronaries and normal LV function.    Outpatient Prescriptions Prior to Visit  Medication Sig Dispense Refill  .  aspirin 81 MG tablet Take 81 mg by mouth daily.    Marland Kitchen atorvastatin (LIPITOR) 40 MG tablet Take 1 tablet (40 mg total) by mouth daily. 30 tablet 5  . Blood Glucose Monitoring Suppl (ACCU-CHEK AVIVA CONNECT) W/DEVICE KIT 1 kit by Does not apply route 2 (two) times daily. 1 kit 0  . busPIRone (BUSPAR) 5 MG tablet TAKE 1/2 TABLET BY MOUTH THREE TIMES A DAY 90 tablet 1  . butalbital-acetaminophen-caffeine (FIORICET, ESGIC) 50-325-40 MG per tablet 1-2 tabs po bid prn HA 60 tablet 1  . ferrous sulfate 325 (65 FE) MG tablet Take 325 mg by mouth daily with breakfast.    . FLUoxetine (PROZAC) 40 MG capsule take 1 capsule by mouth once daily 90 capsule 1  . glipiZIDE (GLUCOTROL XL) 5 MG 24 hr tablet take 2 tablet by mouth once daily WITH BREAKFAST 60 tablet 6  . hydrocortisone (ANUSOL-HC) 2.5 % rectal cream Place 1 application rectally 2  (two) times daily. 35 g 3  . JARDIANCE 10 MG TABS tablet take 1 tablet by mouth once daily 30 tablet 6  . lamoTRIgine (LAMICTAL) 150 MG tablet Take 1 tablet (150 mg total) by mouth daily. 90 tablet 1  . lisinopril (PRINIVIL,ZESTRIL) 40 MG tablet take 1 tablet by mouth once daily 90 tablet 1  . LORazepam (ATIVAN) 0.5 MG tablet take 1 tablet by mouth twice a day 60 tablet 5  . metFORMIN (GLUCOPHAGE) 1000 MG tablet 1 tab po bid 180 tablet 1  . metoprolol tartrate (LOPRESSOR) 25 MG tablet take 1 tablet by mouth twice a day 180 tablet 1  . Multiple Vitamin (MULTIVITAMIN) tablet Take 1 tablet by mouth daily.    . Multiple Vitamins-Minerals (PRESERVISION AREDS) CAPS Take 1 capsule by mouth daily.    . Omega-3 Fatty Acids (FISH OIL) 1000 MG CAPS Take 1 capsule by mouth at bedtime.    Marland Kitchen omeprazole (PRILOSEC) 40 MG capsule Take 1 capsule (40 mg total) by mouth daily. 30 capsule 11  . HYDROcodone-acetaminophen (NORCO/VICODIN) 5-325 MG tablet TAKE 1 TO 2 TABLETS EVERY 6 HOURS AS NEEDED FOR PAIN 120 tablet 0   No facility-administered medications prior to visit.    Allergies  Allergen Reactions  . Levaquin [Levofloxacin In D5w] Hives  . Penicillins     Blistery rash  . Victoza [Liraglutide] Nausea Only    ROS As per HPI  PE: Blood pressure 171/89, pulse 59, temperature 98 F (36.7 C), temperature source Oral, resp. rate 16, height 5' 2.25" (1.581 m), weight 268 lb 8 oz (121.791 kg), SpO2 95 %. Gen: Alert, well appearing.  Patient is oriented to person, place, time, and situation. AFFECT: pleasant, lucid thought and speech. CV: RRR, no m/r/g.   LUNGS: CTA bilat, nonlabored resps, good aeration in all lung fields. EXT: no clubbing or cyanosis.  1+ bilat pitting edema noted.  LABS:    Chemistry      Component Value Date/Time   NA 140 12/19/2014 0850   K 4.3 12/19/2014 0850   CL 103 12/19/2014 0850   CO2 28 12/19/2014 0850   BUN 11 12/19/2014 0850   CREATININE 0.65 12/19/2014 0850    CREATININE 0.67 10/21/2013 1654      Component Value Date/Time   CALCIUM 9.3 12/19/2014 0850   ALKPHOS 82 12/19/2014 0850   AST 18 12/19/2014 0850   ALT 28 12/19/2014 0850   BILITOT 0.4 12/19/2014 0850       IMPRESSION AND PLAN:  Uncontrolled HTN; no w/u at this  time except check BMET. Add HCTZ 39m qd to her current bp med regimen. Continue to monitor bp out of the office.  An After Visit Summary was printed and given to the patient.  FOLLOW UP: Return in about 1 week (around 07/26/2015) for f/u HTN. and we'll come up with a plan for her depression at that time as well.

## 2015-07-20 ENCOUNTER — Other Ambulatory Visit: Payer: Self-pay | Admitting: Internal Medicine

## 2015-07-26 ENCOUNTER — Other Ambulatory Visit (HOSPITAL_COMMUNITY)
Admission: RE | Admit: 2015-07-26 | Discharge: 2015-07-26 | Disposition: A | Discharge status: Home / Self Care | Payer: PRIVATE HEALTH INSURANCE | Source: Ambulatory Visit | Attending: Interventional Radiology | Admitting: Interventional Radiology

## 2015-07-26 ENCOUNTER — Ambulatory Visit
Admission: RE | Admit: 2015-07-26 | Discharge: 2015-07-26 | Disposition: A | Discharge status: Home / Self Care | Payer: PRIVATE HEALTH INSURANCE | Source: Ambulatory Visit | Attending: Internal Medicine | Admitting: Internal Medicine

## 2015-07-26 DIAGNOSIS — E042 Nontoxic multinodular goiter: Secondary | ICD-10-CM | POA: Diagnosis present

## 2015-08-01 ENCOUNTER — Ambulatory Visit (INDEPENDENT_AMBULATORY_CARE_PROVIDER_SITE_OTHER): Payer: PRIVATE HEALTH INSURANCE | Admitting: Family Medicine

## 2015-08-01 ENCOUNTER — Other Ambulatory Visit: Payer: Self-pay | Admitting: Family Medicine

## 2015-08-01 ENCOUNTER — Encounter: Payer: Self-pay | Admitting: Family Medicine

## 2015-08-01 ENCOUNTER — Other Ambulatory Visit: Payer: Self-pay | Admitting: *Deleted

## 2015-08-01 VITALS — BP 103/73 | HR 87 | Temp 98.1°F | Resp 16 | Ht 62.25 in | Wt 262.5 lb

## 2015-08-01 DIAGNOSIS — Z1159 Encounter for screening for other viral diseases: Secondary | ICD-10-CM | POA: Diagnosis not present

## 2015-08-01 DIAGNOSIS — F329 Major depressive disorder, single episode, unspecified: Secondary | ICD-10-CM

## 2015-08-01 DIAGNOSIS — F418 Other specified anxiety disorders: Secondary | ICD-10-CM

## 2015-08-01 DIAGNOSIS — E785 Hyperlipidemia, unspecified: Secondary | ICD-10-CM | POA: Diagnosis not present

## 2015-08-01 DIAGNOSIS — I10 Essential (primary) hypertension: Secondary | ICD-10-CM | POA: Diagnosis not present

## 2015-08-01 DIAGNOSIS — F32A Depression, unspecified: Secondary | ICD-10-CM

## 2015-08-01 DIAGNOSIS — F3162 Bipolar disorder, current episode mixed, moderate: Secondary | ICD-10-CM

## 2015-08-01 DIAGNOSIS — F419 Anxiety disorder, unspecified: Secondary | ICD-10-CM

## 2015-08-01 LAB — BASIC METABOLIC PANEL
BUN: 12 mg/dL (ref 6–23)
CHLORIDE: 97 meq/L (ref 96–112)
CO2: 28 mEq/L (ref 19–32)
Calcium: 9.3 mg/dL (ref 8.4–10.5)
Creatinine, Ser: 0.69 mg/dL (ref 0.40–1.20)
GFR: 93.88 mL/min (ref >60.00)
GLUCOSE: 254 mg/dL — AB (ref 70–99)
POTASSIUM: 3.7 meq/L (ref 3.5–5.1)
SODIUM: 137 meq/L (ref 135–145)

## 2015-08-01 MED ORDER — LAMOTRIGINE 200 MG PO TABS: 200.0000 mg | ORAL_TABLET | Freq: Every day | ORAL | Status: DC

## 2015-08-01 NOTE — Progress Notes (Signed)
OFFICE VISIT  08/01/2015   CC:  Chief Complaint  Patient presents with  . Follow-up    Pt is fasting.    HPI:    Patient is a 55 y.o. Caucasian female who presents for f/u HTN, hyperlipidemia, bipolar d/o with mostly mixed depression/anxiety features.  Her DM 2 and thyroid issues are managed by Dr. Cruzita Lederer in endocrinology. Home bp checks: systolics 426S or lower, dias upper 80s, HR 58-60.  No more bad headaches.  Takes lipitor daily w/out side effect.  Had another FNA bx nodules: R lobe benign, isthmus atypical cells of undetermined signif, she has f/u with Dr. Cruzita Lederer next week.    Past Medical History  Diagnosis Date  . DM2 (diabetes mellitus, type 2) (HCC)     +hx of GDM both pregnancies, dx'd with DM 2 in her 34H--DQQIWL hx of complications  with eyes, kidneys, nerves, or CV system.  Marland Kitchen Hypertension   . Depression     MDD and borderline PD are her two main psych dx as per [facility] records--these records were handwritten and I could not decipher the script so I got no other helpful info beyond these two dx.  . Borderline personality disorder   . DDD (degenerative disc disease), lumbar     Pt says she has hx of herniated disc that has resulted in some numbness in a few toes on left foot.  . Carpal tunnel syndrome on both sides   . Hyperlipidemia   . Obesity   . Daytime somnolence     Nuvigil rx'd by her psychiatrist.  Pt reports that no sleep study has been done.  . Iron deficiency anemia 2013    Likely occult upper GI bleeding.  Hb 8.6--came up to 12 at most recent check after getting on integra, which she now continues once daily.   iFOB was negative 01/13/12.  She says she had been taking excessive NSAIDs at the time.  She has never had a colonoscopy.  . Multinodular goiter     Hx of multiple benign biopsies; repeated u/s's showed no change until the one on 04/2014.  FNA 06/2013 showed R and isthmus nodules intermediate so f/u bx 6 mo recommended.  This  repeat bx is planned as of endo f/u 05/2015.  Marland Kitchen Anxiety     + ?mood disorder (awaiting old psych records as of 12/19/11.  . Low back pain 12/19/2011  . Abnormal nuclear stress test 12/31/11    Low risk lexiscan, but left heart cath was recommended and this showed normal coronary arteries and normal LV function. (01/01/12)  . History of vitamin D deficiency 2009    Past Surgical History  Procedure Laterality Date  . Cesarean section      X 2  . Fna bx thyroid nodules  06/29/14    x 4: some findings to support benign cyst and some "cellular atypica of indeterminate significance".--followed by Dr. Cruzita Lederer.  Plan for repeat bx around July 2016  . Cardiac catheterization  01/01/12    Normal coronaries and normal LV function.    Outpatient Prescriptions Prior to Visit  Medication Sig Dispense Refill  . aspirin 81 MG tablet Take 81 mg by mouth daily.    Marland Kitchen atorvastatin (LIPITOR) 40 MG tablet Take 1 tablet (40 mg total) by mouth daily. 30 tablet 5  . Blood Glucose Monitoring Suppl (ACCU-CHEK AVIVA CONNECT) W/DEVICE KIT 1 kit by Does not apply route 2 (two) times daily. 1 kit 0  . busPIRone (BUSPAR) 5  MG tablet TAKE 1/2 TABLET BY MOUTH THREE TIMES A DAY 90 tablet 1  . butalbital-acetaminophen-caffeine (FIORICET, ESGIC) 50-325-40 MG per tablet 1-2 tabs po bid prn HA 60 tablet 1  . ferrous sulfate 325 (65 FE) MG tablet Take 325 mg by mouth daily with breakfast.    . FLUoxetine (PROZAC) 40 MG capsule take 1 capsule by mouth once daily 90 capsule 1  . glipiZIDE (GLUCOTROL XL) 5 MG 24 hr tablet take 2 tablet by mouth once daily WITH BREAKFAST 60 tablet 6  . hydrochlorothiazide (HYDRODIURIL) 25 MG tablet Take 1 tablet (25 mg total) by mouth daily. 30 tablet 6  . HYDROcodone-acetaminophen (NORCO/VICODIN) 5-325 MG tablet TAKE 1 TO 2 TABLETS EVERY 6 HOURS AS NEEDED FOR PAIN 120 tablet 0  . hydrocortisone (ANUSOL-HC) 2.5 % rectal cream Place 1 application rectally 2 (two) times daily. 35 g 3  . JARDIANCE 10 MG  TABS tablet take 1 tablet by mouth once daily 30 tablet 6  . lisinopril (PRINIVIL,ZESTRIL) 40 MG tablet take 1 tablet by mouth once daily 90 tablet 1  . LORazepam (ATIVAN) 0.5 MG tablet take 1 tablet by mouth twice a day 60 tablet 5  . metFORMIN (GLUCOPHAGE) 1000 MG tablet 1 tab po bid 180 tablet 1  . metoprolol tartrate (LOPRESSOR) 25 MG tablet take 1 tablet by mouth twice a day 180 tablet 1  . Multiple Vitamin (MULTIVITAMIN) tablet Take 1 tablet by mouth daily.    . Multiple Vitamins-Minerals (PRESERVISION AREDS) CAPS Take 1 capsule by mouth daily.    . Omega-3 Fatty Acids (FISH OIL) 1000 MG CAPS Take 1 capsule by mouth at bedtime.    Marland Kitchen omeprazole (PRILOSEC) 40 MG capsule Take 1 capsule (40 mg total) by mouth daily. 30 capsule 11  . lamoTRIgine (LAMICTAL) 150 MG tablet Take 1 tablet (150 mg total) by mouth daily. 90 tablet 1   No facility-administered medications prior to visit.    Allergies  Allergen Reactions  . Levaquin [Levofloxacin In D5w] Hives  . Penicillins     Blistery rash  . Victoza [Liraglutide] Nausea Only    ROS As per HPI  PE: Blood pressure 103/73, pulse 87, temperature 98.1 F (36.7 C), temperature source Oral, resp. rate 16, height 5' 2.25" (1.581 m), weight 262 lb 8 oz (119.069 kg), SpO2 96 %. Gen: Alert, well appearing.  Patient is oriented to person, place, time, and situation. CV: RRR, no m/r/g.   LUNGS: CTA bilat, nonlabored resps, good aeration in all lung fields. EXT: no clubbing or cyanosis.  Trace bilat LE pitting edema  LABS:  Lab Results  Component Value Date   HGBA1C 6.7 06/13/2015   Lab Results  Component Value Date   WBC 5.2 12/19/2014   HGB 13.4 12/19/2014   HCT 40.8 12/19/2014   MCV 91.7 12/19/2014   PLT 215.0 12/19/2014     Chemistry      Component Value Date/Time   NA 141 07/19/2015 0857   K 4.0 07/19/2015 0857   CL 106 07/19/2015 0857   CO2 26 07/19/2015 0857   BUN 8 07/19/2015 0857   CREATININE 0.62 07/19/2015 0857    CREATININE 0.67 10/21/2013 1654      Component Value Date/Time   CALCIUM 8.8 07/19/2015 0857   ALKPHOS 82 12/19/2014 0850   AST 18 12/19/2014 0850   ALT 28 12/19/2014 0850   BILITOT 0.4 12/19/2014 0850     Lab Results  Component Value Date   CHOL 140 12/19/2014   HDL  33.90* 12/19/2014   LDLCALC 49 04/18/2014   LDLDIRECT 75.0 12/19/2014   TRIG 214.0* 12/19/2014   CHOLHDL 4 12/19/2014   Lab Results  Component Value Date   TSH 1.13 12/19/2014    IMPRESSION AND PLAN:  1) HTN; well controlled now with recent addition of HCTZ.  Pt feels a little better--esp w/ regard to headaches. Check BMET today  2) Hyperlipidemia: tolerating statin.  Diet--noncompliant, exercise noncompliant. Plan on recheck FLP w/AST/ALT around 12/2015.  3) Bipolar disorder: mixed features.  Mainly depressed phase currently.  Increase lamictal to max of 200 mg qd and continue fluoxetine 66m qd and buspar 1/2 of 531mtab tid.  Also continue ativan 0.60m90mid.  4) Hep C screening: pt desired this testing so I ordered this today.  An After Visit Summary was printed and given to the patient.  FOLLOW UP: Return in about 2 months (around 09/29/2015) for f/u depr/anxi.

## 2015-08-01 NOTE — Telephone Encounter (Signed)
LOV: 08/01/15 NOV: 09/28/15  RF request for metoprolol Last written: 01/30/15 #180 w/ 1RF  RF request for metformin Last written: 02/08/15 #180 w/ 1RF  RF request for lomotrigine Last written: 08/01/15 #30 w/ 3RF - pharmacy sent back request for 90 days. Rx sent for #90 w/ 0Rf

## 2015-08-01 NOTE — Telephone Encounter (Signed)
Pharmacy called stating that they received two orders for lomotrigine. First order was for  which was sent by Dr. Milinda Cave today during pts office visit and second order was sent for  because I received a refill request from pharmacy requesting 90 day. I did not notice dose change. Rx was sent. I spoke to [place] at [facility] and had him cancel Rx I sent.

## 2015-08-01 NOTE — Progress Notes (Signed)
Pre visit review using our clinic review tool, if applicable. No additional management support is needed unless otherwise documented below in the visit note. 

## 2015-08-02 LAB — HEPATITIS C ANTIBODY: HCV AB: NEGATIVE

## 2015-08-07 ENCOUNTER — Telehealth: Payer: Self-pay | Admitting: Internal Medicine

## 2015-08-07 MED ORDER — CANAGLIFLOZIN 100 MG PO TABS: 100.0000 mg | ORAL_TABLET | Freq: Every day | ORAL | Status: DC

## 2015-08-07 NOTE — Telephone Encounter (Signed)
Invokana 100 mg daily

## 2015-08-07 NOTE — Telephone Encounter (Signed)
Returned pt's call. She advised that her ins will no longer cover Jardiance. The alternatives are Docia Barrier, Invokamet and Xigduo. Please advise.

## 2015-08-07 NOTE — Telephone Encounter (Signed)
Pt called and said she just found out her new insurance will not cover her medications and was given several alternative options and would like to discuss with you. CB (Work #) [phone]

## 2015-08-07 NOTE — Telephone Encounter (Signed)
Called pt and advised her per Dr Charlean Sanfilippo result note. Invokana sent to pt's pharmacy.

## 2015-08-08 ENCOUNTER — Encounter (HOSPITAL_COMMUNITY): Payer: Self-pay

## 2015-08-08 ENCOUNTER — Ambulatory Visit: Payer: PRIVATE HEALTH INSURANCE | Admitting: Internal Medicine

## 2015-08-08 ENCOUNTER — Encounter: Payer: Self-pay | Admitting: Internal Medicine

## 2015-08-10 ENCOUNTER — Other Ambulatory Visit: Payer: Self-pay | Admitting: Family Medicine

## 2015-08-10 NOTE — Telephone Encounter (Signed)
RF request for fiorinal LOV: 08/01/15 Next ov: 09/26/15 Last written: 11/10/14 #60 w/ 1RF  Please advise. Thanks.

## 2015-08-13 ENCOUNTER — Encounter: Payer: Self-pay | Admitting: Family Medicine

## 2015-08-13 NOTE — Telephone Encounter (Signed)
Rx faxed

## 2015-08-21 ENCOUNTER — Encounter: Payer: Self-pay | Admitting: Internal Medicine

## 2015-08-28 ENCOUNTER — Other Ambulatory Visit: Payer: Self-pay | Admitting: *Deleted

## 2015-08-28 MED ORDER — HYDROCODONE-ACETAMINOPHEN 5-325 MG PO TABS: ORAL_TABLET | ORAL | Status: DC

## 2015-08-28 NOTE — Telephone Encounter (Signed)
Pt LMOM on 08/28/15 at 8:46am requesting a refill.   RF request for hydrocodone LOV: 08/01/15 Next ov: 10/03/15 Last written: 07/19/15 #120 w/ 0RF  Please advise. Thanks.

## 2015-08-29 NOTE — Telephone Encounter (Signed)
Rx put up front for p/u. Pt advised and voiced understanding.   

## 2015-08-31 ENCOUNTER — Other Ambulatory Visit: Payer: Self-pay | Admitting: Family Medicine

## 2015-09-09 ENCOUNTER — Other Ambulatory Visit: Payer: Self-pay | Admitting: Family Medicine

## 2015-09-10 NOTE — Telephone Encounter (Signed)
RF request for buspirone LOV: 08/01/15 Next ov: 10/03/15 Last written: 05/02/15 #90 w/ 1RF  Please advise. Thanks.

## 2015-09-26 ENCOUNTER — Ambulatory Visit: Payer: PRIVATE HEALTH INSURANCE | Admitting: Internal Medicine

## 2015-09-28 ENCOUNTER — Ambulatory Visit: Payer: PRIVATE HEALTH INSURANCE | Admitting: Family Medicine

## 2015-10-03 ENCOUNTER — Ambulatory Visit: Payer: PRIVATE HEALTH INSURANCE | Admitting: Family Medicine

## 2015-10-03 ENCOUNTER — Encounter: Payer: Self-pay | Admitting: Family Medicine

## 2015-10-03 DIAGNOSIS — Z0289 Encounter for other administrative examinations: Secondary | ICD-10-CM

## 2015-10-12 ENCOUNTER — Ambulatory Visit (INDEPENDENT_AMBULATORY_CARE_PROVIDER_SITE_OTHER): Payer: PRIVATE HEALTH INSURANCE | Admitting: Family Medicine

## 2015-10-12 ENCOUNTER — Encounter: Payer: Self-pay | Admitting: Family Medicine

## 2015-10-12 VITALS — BP 128/74 | HR 74 | Temp 98.5°F | Resp 16 | Ht 62.25 in | Wt 269.5 lb

## 2015-10-12 DIAGNOSIS — R400 Somnolence: Secondary | ICD-10-CM

## 2015-10-12 DIAGNOSIS — G4710 Hypersomnia, unspecified: Secondary | ICD-10-CM | POA: Diagnosis not present

## 2015-10-12 DIAGNOSIS — G4733 Obstructive sleep apnea (adult) (pediatric): Secondary | ICD-10-CM

## 2015-10-12 MED ORDER — DULOXETINE HCL 60 MG PO CPEP: 60.0000 mg | ORAL_CAPSULE | Freq: Every day | ORAL | Status: DC

## 2015-10-12 MED ORDER — HYDROCODONE-ACETAMINOPHEN 5-325 MG PO TABS: ORAL_TABLET | ORAL | Status: DC

## 2015-10-12 NOTE — Progress Notes (Signed)
OFFICE VISIT  10/12/2015   CC:  Chief Complaint  Patient presents with  . Follow-up    Depression   HPI:    Patient is a 55 y.o. Caucasian female who presents for f/u bipolar depression. Last visit about 2 months ago I increased her lamictal from 188m to 200 mg qd. She says she is unchanged.  Describes a lonely existence, works all the time to avoid going home to empty house. Has no close friends.  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  Feels sleepy all the time, wants to retreat into sleep on weekends.  However, during the week she often stays up until 3 AM watching TV.  No real "risk taking" behavior, no real pattern of impulsivity that is problematic.  Anxiety is mild and chronic, doesn't have panic attacks.  Says that lamictal has been helpful since she got on it, but she can't be specific in telling me HOW it has helped her. Denies SI or HI.  Pt has chronic LBP from DDD and I have been treating her pain in good faith with vicodin.  She says she takes 2-3 tabs per day, occasionally 4.  Asks for RF of this rx today.  She reports longstanding excessive daytime somnolence, reports hx of witnessed apneic spells in her sleep. We were in the process of getting her in with pulm in 2013 and due to an illness she never followed through. She asks for referral again today.  Past Medical History  Diagnosis Date  . DM2 (diabetes mellitus, type 2) (HCC)     +hx of GDM both pregnancies, dx'd with DM 2 in her 89Y--HCWCBJhx of complications  with eyes, kidneys, nerves, or CV system.  .Marland KitchenHypertension   . Depression     MDD and borderline PD are her two main psych dx as per [facility] records--these records were handwritten and I could not decipher the script so I got no other helpful info beyond these two dx.  . Borderline personality disorder   . DDD (degenerative disc disease), lumbar     Pt says she has hx  of herniated disc that has resulted in some numbness in a few toes on left foot.  . Carpal tunnel syndrome on both sides   . Hyperlipidemia   . Obesity   . Daytime somnolence     Nuvigil rx'd by her psychiatrist.  Pt reports that no sleep study has been done.  . Iron deficiency anemia 2013    Likely occult upper GI bleeding.  Hb 8.6--came up to 12 at most recent check after getting on integra, which she now continues once daily.   iFOB was negative 01/13/12.  She says she had been taking excessive NSAIDs at the time.  She has never had a colonoscopy.  . Multinodular goiter     Hx of multiple benign biopsies; repeated u/s's showed no change until the one on 04/2014.  FNA 06/2013 showed R and isthmus nodules intermediate so f/u bx 6 mo recommended.  Repeat bx 07/26/15 showed R lobe benign, isthmus atypical cells of undetermined signif,--detailed path analysis was done and it was BENIGN  . Anxiety     + ?mood disorder (awaiting old psych records as of 12/19/11.  . Low back pain 12/19/2011  . Abnormal nuclear stress test 12/31/11    Low risk lexiscan, but left heart cath was recommended and this showed normal coronary arteries and normal  LV function. (01/01/12)  . History of vitamin D deficiency 2009    Past Surgical History  Procedure Laterality Date  . Cesarean section      X 2  . Fna bx thyroid nodules  06/29/14    x 4: some findings to support benign cyst and some "cellular atypica of indeterminate significance".--followed by Dr. Cruzita Lederer.  Repeat bx 05/2015 BENIGN.  Marland Kitchen Cardiac catheterization  01/01/12    Normal coronaries and normal LV function.    Outpatient Prescriptions Prior to Visit  Medication Sig Dispense Refill  . aspirin 81 MG tablet Take 81 mg by mouth daily.    Marland Kitchen atorvastatin (LIPITOR) 40 MG tablet Take 1 tablet (40 mg total) by mouth daily. 30 tablet 5  . Blood Glucose Monitoring Suppl (ACCU-CHEK AVIVA CONNECT) W/DEVICE KIT 1 kit by Does not apply route 2 (two) times daily. 1 kit 0   . busPIRone (BUSPAR) 5 MG tablet take 1/2 tablet by mouth three times a day 90 tablet 1  . butalbital-aspirin-caffeine (FIORINAL) 50-325-40 MG capsule take 1 to 2 capsules by mouth twice a day if needed for headache 60 capsule 1  . canagliflozin (INVOKANA) 100 MG TABS tablet Take 1 tablet (100 mg total) by mouth daily before breakfast. 30 tablet 2  . ferrous sulfate 325 (65 FE) MG tablet Take 325 mg by mouth daily with breakfast.    . glipiZIDE (GLUCOTROL XL) 5 MG 24 hr tablet take 2 tablet by mouth once daily WITH BREAKFAST 60 tablet 6  . hydrochlorothiazide (HYDRODIURIL) 25 MG tablet Take 1 tablet (25 mg total) by mouth daily. 30 tablet 6  . hydrocortisone (ANUSOL-HC) 2.5 % rectal cream Place 1 application rectally 2 (two) times daily. 35 g 3  . lamoTRIgine (LAMICTAL) 200 MG tablet Take 1 tablet (200 mg total) by mouth daily. 30 tablet 6  . lisinopril (PRINIVIL,ZESTRIL) 40 MG tablet take 1 tablet by mouth once daily 90 tablet 1  . LORazepam (ATIVAN) 0.5 MG tablet take 1 tablet by mouth twice a day 60 tablet 5  . metFORMIN (GLUCOPHAGE) 1000 MG tablet take 1 tablet by mouth twice a day for diabetes 180 tablet 1  . metoprolol tartrate (LOPRESSOR) 25 MG tablet take 1 tablet by mouth twice a day 180 tablet 3  . Multiple Vitamin (MULTIVITAMIN) tablet Take 1 tablet by mouth daily.    . Multiple Vitamins-Minerals (PRESERVISION AREDS) CAPS Take 1 capsule by mouth daily.    . Omega-3 Fatty Acids (FISH OIL) 1000 MG CAPS Take 1 capsule by mouth at bedtime.    Marland Kitchen omeprazole (PRILOSEC) 40 MG capsule Take 1 capsule (40 mg total) by mouth daily. 30 capsule 11  . FLUoxetine (PROZAC) 40 MG capsule take 1 capsule by mouth once daily 90 capsule 1  . HYDROcodone-acetaminophen (NORCO/VICODIN) 5-325 MG tablet TAKE 1 TO 2 TABLETS EVERY 6 HOURS AS NEEDED FOR PAIN 120 tablet 0  . butalbital-acetaminophen-caffeine (FIORICET, ESGIC) 50-325-40 MG per tablet 1-2 tabs po bid prn HA (Patient not taking: Reported on 10/12/2015)  60 tablet 1   No facility-administered medications prior to visit.    Allergies  Allergen Reactions  . Levaquin [Levofloxacin In D5w] Hives  . Penicillins     Blistery rash  . Victoza [Liraglutide] Nausea Only    ROS As per HPI  PE: Blood pressure 128/74, pulse 74, temperature 98.5 F (36.9 C), temperature source Oral, resp. rate 16, height 5' 2.25" (1.581 m), weight 269 lb 8 oz (122.244 kg), SpO2 97 %. Gen: Alert,  well appearing.  Patient is oriented to person, place, time, and situation. AFFECT: pleasant, lucid thought and speech. No further exam today.  LABS:  Lab Results  Component Value Date   TSH 1.13 12/19/2014   Lab Results  Component Value Date   WBC 5.2 12/19/2014   HGB 13.4 12/19/2014   HCT 40.8 12/19/2014   MCV 91.7 12/19/2014   PLT 215.0 12/19/2014   Lab Results  Component Value Date   CREATININE 0.69 08/01/2015   BUN 12 08/01/2015   NA 137 08/01/2015   K 3.7 08/01/2015   CL 97 08/01/2015   CO2 28 08/01/2015   Lab Results  Component Value Date   ALT 28 12/19/2014   AST 18 12/19/2014   ALKPHOS 82 12/19/2014   BILITOT 0.4 12/19/2014   Lab Results  Component Value Date   CHOL 140 12/19/2014   Lab Results  Component Value Date   HDL 33.90* 12/19/2014   Lab Results  Component Value Date   LDLCALC 49 04/18/2014   Lab Results  Component Value Date   TRIG 214.0* 12/19/2014   Lab Results  Component Value Date   CHOLHDL 4 12/19/2014   Lab Results  Component Value Date   HGBA1C 6.7 06/13/2015   IMPRESSION AND PLAN:  1) Bipolar depression: will continue lamictal 287m qd. Will d/c fluoxetine and start duloxetine 622mqd.  She will continue current dosing of both buspar and ativan. Unfortunately, some features of her life in general are not conducive to getting better from a psych standpoint.  2) Chronic low back pain: I RF'd her vicodin rx today.  3) Excessive somnolence + witnessed apneic spells in sleep: suspect OSA. Will order  referral to pulm for further evaluation.  An After Visit Summary was printed and given to the patient.  Spent 30 min with pt today, with >50% of this time spent in counseling and care coordination regarding the above problems.  FOLLOW UP: Return in about 6 weeks (around 11/23/2015) for f/u mood/meds.  Signed:  PhCrissie SicklesMD           10/12/2015

## 2015-10-12 NOTE — Progress Notes (Signed)
Pre visit review using our clinic review tool, if applicable. No additional management support is needed unless otherwise documented below in the visit note. 

## 2015-11-30 ENCOUNTER — Ambulatory Visit: Payer: PRIVATE HEALTH INSURANCE | Admitting: Family Medicine

## 2015-12-08 ENCOUNTER — Other Ambulatory Visit: Payer: Self-pay | Admitting: Internal Medicine

## 2015-12-13 ENCOUNTER — Other Ambulatory Visit: Payer: Self-pay | Admitting: Family Medicine

## 2015-12-13 NOTE — Telephone Encounter (Signed)
RF request for lorazepam LOV: 08/01/15 Next ov: 12/19/15 Last written: 06/03/14 #60 w/ 5RF  Please advise. Thanks.

## 2015-12-13 NOTE — Telephone Encounter (Signed)
Rx faxed

## 2015-12-17 ENCOUNTER — Ambulatory Visit: Payer: 59 | Admitting: Family Medicine

## 2015-12-19 ENCOUNTER — Encounter: Payer: Self-pay | Admitting: Family Medicine

## 2015-12-19 ENCOUNTER — Ambulatory Visit (INDEPENDENT_AMBULATORY_CARE_PROVIDER_SITE_OTHER): Payer: PRIVATE HEALTH INSURANCE | Admitting: Family Medicine

## 2015-12-19 VITALS — BP 91/65 | HR 94 | Temp 98.5°F | Resp 16 | Ht 62.25 in | Wt 259.8 lb

## 2015-12-19 DIAGNOSIS — R112 Nausea with vomiting, unspecified: Secondary | ICD-10-CM

## 2015-12-19 DIAGNOSIS — R0789 Other chest pain: Secondary | ICD-10-CM | POA: Diagnosis not present

## 2015-12-19 LAB — POCT URINALYSIS DIPSTICK
Bilirubin, UA: NEGATIVE
Blood, UA: NEGATIVE
GLUCOSE UA: 500
KETONES UA: NEGATIVE
LEUKOCYTES UA: NEGATIVE
Nitrite, UA: NEGATIVE
Protein, UA: NEGATIVE
SPEC GRAV UA: 1.01
Urobilinogen, UA: 0.2
pH, UA: 5

## 2015-12-19 MED ORDER — HYDROCODONE-ACETAMINOPHEN 5-325 MG PO TABS: ORAL_TABLET | ORAL | Status: DC

## 2015-12-19 MED ORDER — PROMETHAZINE HCL 12.5 MG PO TABS: ORAL_TABLET | ORAL | Status: DC

## 2015-12-19 NOTE — Progress Notes (Signed)
Pre visit review using our clinic review tool, if applicable. No additional management support is needed unless otherwise documented below in the visit note. 

## 2015-12-19 NOTE — Progress Notes (Signed)
OFFICE VISIT  12/19/2015   CC:  Chief Complaint  Patient presents with  . Follow-up    Mood/Meds  . Nausea    x 4 days   HPI:    Patient is a 55 y.o. Caucasian female who presents for 2 mo f/u mood and anxiety disorder. Last visit I switched her from fluoxetine to duloxetine 51m qd.   She is sick now so we did not cover her chronic probs of depression and anxiety today---although she did say she is taking the duloxetine and feels no better.  Acute illness:  Nausea x 4d.  When she walks she feels jittery/shaky inside.  This doesn't occur when at rest. Has had some vomiting and some dry heaves.  Drinking diet sodas.  Says abdomen has feeling of "like I have to go to the bathroom".  Says she has always done this b/c of taking metformin.  Due to feeling bad with current illness, she stopped her metformin and this abdominal complaint went away.  Hurts in a different way across mid abdominal area occasionally but sounds like this is not new either.  No radiation of the pain. No fever.  Has chronic urge/stress incontinence that has not changed any.  No dysuria, urinary frequency. No HA.  No myalgias.  No rash.  Has chronic/recurrent diarrhea predominant IBS.  She doesn't drink alcohol.  She finally offers that she did have some CP "on and off" the first 1-2 days of this illness.  Says it happened both at rest and with walking, was primarily a right sided ache that would last a few minutes.  No known exacerbating or alleviating factors.  No SOB, no palpitations, no left arm or jaw pain. No swelling of LE's noted.  Past Medical History  Diagnosis Date  . DM2 (diabetes mellitus, type 2) (HCC)     +hx of GDM both pregnancies, dx'd with DM 2 in her 89D--DUKGURhx of complications  with eyes, kidneys, nerves, or CV system.  .Marland KitchenHypertension   . Depression     MDD and borderline PD are her two main psych dx as per [facility] records--these records were handwritten and I could not  decipher the script so I got no other helpful info beyond these two dx.  I have yet to see/hear any evidence of borderline personality d/o in her since she has been my pt.  . Borderline personality disorder   . DDD (degenerative disc disease), lumbar     Pt says she has hx of herniated disc that has resulted in some numbness in a few toes on left foot.  . Carpal tunnel syndrome on both sides   . Hyperlipidemia   . Obesity   . Daytime somnolence     Nuvigil rx'd by her psychiatrist.  Pt reports that no sleep study has been done.  . Iron deficiency anemia 2013    Likely occult upper GI bleeding.  Hb 8.6--came up to 12 at most recent check after getting on integra, which she now continues once daily.   iFOB was negative 01/13/12.  She says she had been taking excessive NSAIDs at the time.  She has never had a colonoscopy.  . Multinodular goiter     Hx of multiple benign biopsies; repeated u/s's showed no change until the one on 04/2014.  FNA 06/2013 showed R and isthmus nodules intermediate so f/u bx 6 mo recommended.  Repeat bx 07/26/15 showed R lobe benign, isthmus atypical cells of undetermined signif,--detailed path analysis  was done and it was BENIGN  . Anxiety     + ?mood disorder (awaiting old psych records as of 12/19/11.  . Low back pain 12/19/2011  . Abnormal nuclear stress test 12/31/11    Low risk lexiscan, but left heart cath was recommended and this showed normal coronary arteries and normal LV function. (01/01/12)  . History of vitamin D deficiency 2009    Past Surgical History  Procedure Laterality Date  . Cesarean section      X 2  . Fna bx thyroid nodules  06/29/14    x 4: some findings to support benign cyst and some "cellular atypica of indeterminate significance".--followed by Dr. Cruzita Lederer.  Repeat bx 05/2015 BENIGN.  Marland Kitchen Cardiac catheterization  01/01/12    Normal coronaries and normal LV function.    Outpatient Prescriptions Prior to Visit  Medication Sig Dispense Refill  .  aspirin 81 MG tablet Take 81 mg by mouth daily.    Marland Kitchen atorvastatin (LIPITOR) 40 MG tablet Take 1 tablet (40 mg total) by mouth daily. 30 tablet 5  . Blood Glucose Monitoring Suppl (ACCU-CHEK AVIVA CONNECT) W/DEVICE KIT 1 kit by Does not apply route 2 (two) times daily. 1 kit 0  . busPIRone (BUSPAR) 5 MG tablet take 1/2 tablet by mouth three times a day 90 tablet 1  . butalbital-aspirin-caffeine (FIORINAL) 50-325-40 MG capsule take 1 to 2 capsules by mouth twice a day if needed for headache 60 capsule 1  . DULoxetine (CYMBALTA) 60 MG capsule Take 1 capsule (60 mg total) by mouth daily. 30 capsule 2  . ferrous sulfate 325 (65 FE) MG tablet Take 325 mg by mouth daily with breakfast.    . glipiZIDE (GLUCOTROL XL) 5 MG 24 hr tablet take 2 tablet by mouth once daily WITH BREAKFAST 60 tablet 6  . hydrochlorothiazide (HYDRODIURIL) 25 MG tablet Take 1 tablet (25 mg total) by mouth daily. 30 tablet 6  . hydrocortisone (ANUSOL-HC) 2.5 % rectal cream Place 1 application rectally 2 (two) times daily. 35 g 3  . INVOKANA 100 MG TABS tablet take 1 tablet by mouth daily before the first meal of the day 30 tablet 0  . lamoTRIgine (LAMICTAL) 200 MG tablet Take 1 tablet (200 mg total) by mouth daily. 30 tablet 6  . lisinopril (PRINIVIL,ZESTRIL) 40 MG tablet take 1 tablet by mouth once daily 90 tablet 1  . LORazepam (ATIVAN) 0.5 MG tablet take 1 tablet by mouth twice a day 60 tablet 5  . metFORMIN (GLUCOPHAGE) 1000 MG tablet take 1 tablet by mouth twice a day for diabetes 180 tablet 1  . metoprolol tartrate (LOPRESSOR) 25 MG tablet take 1 tablet by mouth twice a day 180 tablet 3  . Multiple Vitamin (MULTIVITAMIN) tablet Take 1 tablet by mouth daily.    . Multiple Vitamins-Minerals (PRESERVISION AREDS) CAPS Take 1 capsule by mouth daily.    . Omega-3 Fatty Acids (FISH OIL) 1000 MG CAPS Take 1 capsule by mouth at bedtime.    Marland Kitchen omeprazole (PRILOSEC) 40 MG capsule Take 1 capsule (40 mg total) by mouth daily. 30 capsule 11   . HYDROcodone-acetaminophen (NORCO/VICODIN) 5-325 MG tablet TAKE 1 TO 2 TABLETS EVERY 6 HOURS AS NEEDED FOR PAIN 120 tablet 0   No facility-administered medications prior to visit.    Allergies  Allergen Reactions  . Levaquin [Levofloxacin In D5w] Hives  . Penicillins     Blistery rash  . Victoza [Liraglutide] Nausea Only    ROS As per HPI  PE: Blood pressure 91/65, pulse 94, temperature 98.5 F (36.9 C), temperature source Oral, resp. rate 16, height 5' 2.25" (1.581 m), weight 259 lb 12 oz (117.822 kg), SpO2 98 %.  Pt examined with Sharen Hones, CMA, as chaperone.  Gen: Alert, well appearing.  Patient is oriented to person, place, time, and situation. AFFECT: pleasant, lucid thought and speech. GQB:VQXI: no injection, icteris, swelling, or exudate.  EOMI, PERRLA. Mouth: lips without lesion/swelling.  Oral mucosa pink and moist. Oropharynx without erythema, exudate, or swelling.  CV: RRR, no m/r/g.   LUNGS: CTA bilat, nonlabored resps, good aeration in all lung fields. ABD: soft, NT, ND, BS normal.  No hepatospenomegaly or mass.  No bruits. EXT: no clubbing, cyanosis, or edema.    LABS:   12 lead EKG today: NSR, rate 89.  Poor R wave progression and flipped T waves in V1 and III unchanged from prev EKG 03/04/12 at [facility] and no change from EKG in EMR 11/2011. Negative QRS complexes in III and aVF not significantly changed compared to 2013 EKG.    Chemistry      Component Value Date/Time   NA 137 08/01/2015 0904   K 3.7 08/01/2015 0904   CL 97 08/01/2015 0904   CO2 28 08/01/2015 0904   BUN 12 08/01/2015 0904   CREATININE 0.69 08/01/2015 0904   CREATININE 0.67 10/21/2013 1654      Component Value Date/Time   CALCIUM 9.3 08/01/2015 0904   ALKPHOS 82 12/19/2014 0850   AST 18 12/19/2014 0850   ALT 28 12/19/2014 0850   BILITOT 0.4 12/19/2014 0850     Lab Results  Component Value Date   HGBA1C 6.7 06/13/2015   Lab Results  Component Value Date   WBC 5.2  12/19/2014   HGB 13.4 12/19/2014   HCT 40.8 12/19/2014   MCV 91.7 12/19/2014   PLT 215.0 12/19/2014   CC UA today: 500 mg/dl glucose, otherwise normal.  IMPRESSION AND PLAN:  Nausea with vomiting: now just with nausea.  Has some signs of dehydration (wt loss and slightly low bp). She looks and acts absolutely well today, fortunately.  Could simply be a viral gastritis or a reaction to poorly controlled blood sugars (or are her sugars up due to acute stress of her illness?). Will check CBC w/diff, CMET, lipase and will order complete abdominal u/s. Phenergan 12.64m, 1-2 tabs q6h prn, #30, RF x 1.  She asked for a RF of her pain medication today so I printed vicodin 5/325, 1-2 q6h prn, #120.  An After Visit Summary was printed and given to the patient.  Spent 50 min with pt today, with >50% of this time spent in counseling and care coordination regarding the above problems.  FOLLOW UP: Return in about 1 week (around 12/26/2015) for f/u nausea (30 min).  Signed:  PCrissie Sickles MD           12/19/2015

## 2015-12-20 ENCOUNTER — Ambulatory Visit
Admission: RE | Admit: 2015-12-20 | Discharge: 2015-12-20 | Disposition: A | Discharge status: Home / Self Care | Payer: PRIVATE HEALTH INSURANCE | Source: Ambulatory Visit | Attending: Family Medicine | Admitting: Family Medicine

## 2015-12-20 ENCOUNTER — Other Ambulatory Visit: Payer: Self-pay | Admitting: Family Medicine

## 2015-12-20 DIAGNOSIS — N289 Disorder of kidney and ureter, unspecified: Secondary | ICD-10-CM

## 2015-12-20 DIAGNOSIS — R112 Nausea with vomiting, unspecified: Secondary | ICD-10-CM

## 2015-12-20 LAB — CBC WITH DIFFERENTIAL/PLATELET
Basophils Absolute: 0 10*3/uL (ref 0.0–0.1)
Basophils Relative: 0.4 % (ref 0.0–3.0)
EOS PCT: 1.1 % (ref 0.0–5.0)
Eosinophils Absolute: 0.1 10*3/uL (ref 0.0–0.7)
HCT: 40.4 % (ref 36.0–46.0)
Hemoglobin: 13.8 g/dL (ref 12.0–15.0)
LYMPHS ABS: 2.2 10*3/uL (ref 0.7–4.0)
Lymphocytes Relative: 45.9 % (ref 12.0–46.0)
MCHC: 34.2 g/dL (ref 30.0–36.0)
MCV: 93.7 fl (ref 78.0–100.0)
MONO ABS: 0.3 10*3/uL (ref 0.1–1.0)
MONOS PCT: 5.9 % (ref 3.0–12.0)
NEUTROS ABS: 2.2 10*3/uL (ref 1.4–7.7)
Neutrophils Relative %: 46.7 % (ref 43.0–77.0)
PLATELETS: 215 10*3/uL (ref 150.0–400.0)
RBC: 4.31 Mil/uL (ref 3.87–5.11)
RDW: 13.6 % (ref 11.5–15.5)
WBC: 4.8 10*3/uL (ref 4.0–10.5)

## 2015-12-20 LAB — COMPREHENSIVE METABOLIC PANEL
ALBUMIN: 4.3 g/dL (ref 3.5–5.2)
ALK PHOS: 83 U/L (ref 39–117)
ALT: 46 U/L — ABNORMAL HIGH (ref 0–35)
AST: 28 U/L (ref 0–37)
BUN: 13 mg/dL (ref 6–23)
CALCIUM: 9.5 mg/dL (ref 8.4–10.5)
CHLORIDE: 98 meq/L (ref 96–112)
CO2: 26 mEq/L (ref 19–32)
CREATININE: 0.81 mg/dL (ref 0.40–1.20)
GFR: 77.91 mL/min (ref >60.00)
Glucose, Bld: 401 mg/dL — ABNORMAL HIGH (ref 70–99)
Potassium: 3.9 mEq/L (ref 3.5–5.1)
Sodium: 134 mEq/L — ABNORMAL LOW (ref 135–145)
Total Bilirubin: 0.3 mg/dL (ref 0.2–1.2)
Total Protein: 7.2 g/dL (ref 6.0–8.3)

## 2015-12-20 LAB — LIPASE: LIPASE: 34 U/L (ref 11.0–59.0)

## 2015-12-21 ENCOUNTER — Telehealth: Payer: Self-pay

## 2015-12-21 ENCOUNTER — Other Ambulatory Visit: Payer: PRIVATE HEALTH INSURANCE

## 2015-12-21 DIAGNOSIS — R7402 Elevation of levels of lactic acid dehydrogenase (LDH): Secondary | ICD-10-CM

## 2015-12-21 DIAGNOSIS — R740 Nonspecific elevation of levels of transaminase and lactic acid dehydrogenase [LDH]: Principal | ICD-10-CM

## 2015-12-21 DIAGNOSIS — R7401 Elevation of levels of liver transaminase levels: Secondary | ICD-10-CM

## 2015-12-21 NOTE — Telephone Encounter (Signed)
Called and spoke with patient. Was able to get patient scheduled for an appointment. She is coming in August 29th, 2017 at 4:00. Patient stated she was aware, and knew she needed to see you for a follow up appointment from thyroid as well.

## 2015-12-22 LAB — HEPATITIS C ANTIBODY: HCV AB: NEGATIVE

## 2015-12-22 LAB — HEPATITIS B SURFACE ANTIGEN: HEP B S AG: NEGATIVE

## 2015-12-25 ENCOUNTER — Telehealth: Payer: Self-pay | Admitting: *Deleted

## 2015-12-25 NOTE — Telephone Encounter (Signed)
Tried calling pt NA and unable to leave a message.  

## 2015-12-25 NOTE — Telephone Encounter (Signed)
Message  Received: Today    Jeoffrey MassedPhilip H McGowen, MD  Smitty KnudsenHeather K Myrella Fahs, CMA           She doesn't need an appointment to go over results of her CT scan.  I'll call her and discuss the results. Appt can be cancelled.-thx       Previous Messages     ----- Message -----   From: Smitty KnudsenHeather K Azara Gemme, CMA   Sent: 12/25/2015  9:23 AM    To: Jeoffrey MassedPhilip H McGowen, MD   Please advise. Thanks.    ----- Message -----   From: Carmelia Bakeiane S Tomerlin   Sent: 12/25/2015  8:54 AM    To: Smitty KnudsenHeather K Reilly Molchan, CMA   Patient is having CT scan Thursday afternoon. Her appt Friday at 3:00 is to go over results. Please let me know if that is enough time to get the results. She can't come in Monday or Tuesday. Thanks.

## 2015-12-26 ENCOUNTER — Ambulatory Visit: Payer: PRIVATE HEALTH INSURANCE | Admitting: Family Medicine

## 2015-12-27 ENCOUNTER — Other Ambulatory Visit: Payer: PRIVATE HEALTH INSURANCE

## 2015-12-27 ENCOUNTER — Ambulatory Visit
Admission: RE | Admit: 2015-12-27 | Discharge: 2015-12-27 | Disposition: A | Discharge status: Home / Self Care | Payer: PRIVATE HEALTH INSURANCE | Source: Ambulatory Visit | Attending: Family Medicine | Admitting: Family Medicine

## 2015-12-27 ENCOUNTER — Other Ambulatory Visit: Payer: Self-pay | Admitting: Family Medicine

## 2015-12-27 DIAGNOSIS — N289 Disorder of kidney and ureter, unspecified: Secondary | ICD-10-CM

## 2015-12-27 MED ORDER — IOPAMIDOL (ISOVUE-300) INJECTION 61%
125.0000 mL | Freq: Once | INTRAVENOUS | Status: AC | PRN
  Administered 2015-12-27: 125 mL via INTRAVENOUS

## 2015-12-27 NOTE — Telephone Encounter (Signed)
Tried calling pt NA and unable to leave a message.  

## 2015-12-28 ENCOUNTER — Ambulatory Visit (INDEPENDENT_AMBULATORY_CARE_PROVIDER_SITE_OTHER): Payer: PRIVATE HEALTH INSURANCE | Admitting: Family Medicine

## 2015-12-28 ENCOUNTER — Encounter: Payer: Self-pay | Admitting: Family Medicine

## 2015-12-28 VITALS — BP 108/70 | HR 84 | Temp 98.7°F | Resp 16 | Ht 66.0 in | Wt 255.0 lb

## 2015-12-28 DIAGNOSIS — F329 Major depressive disorder, single episode, unspecified: Secondary | ICD-10-CM

## 2015-12-28 DIAGNOSIS — F32A Depression, unspecified: Secondary | ICD-10-CM

## 2015-12-28 DIAGNOSIS — N289 Disorder of kidney and ureter, unspecified: Secondary | ICD-10-CM

## 2015-12-28 DIAGNOSIS — R110 Nausea: Secondary | ICD-10-CM | POA: Diagnosis not present

## 2015-12-28 NOTE — Progress Notes (Signed)
Pre visit review using our clinic review tool, if applicable. No additional management support is needed unless otherwise documented below in the visit note. 

## 2015-12-28 NOTE — Progress Notes (Signed)
OFFICE VISIT  12/28/2015   CC:  Chief Complaint  Patient presents with  . Follow-up    Nausea   HPI:    Patient is a 55 y.o. Caucasian female who presents for 9 day f/u nausea. After last visit: labs unremarkable.  Abd u/s unremarkable except small R kidney lesion that we tried to further clarify with CT abd with contrast.  This now still needs further clarification with a noncontrast CT abd to make sure it is a hemorrhagic cyst and not a solid mass.  Her nausea has resolved now.  Currently she does not see a [place] provider, but she is struggling a lot with depressed mood.  Says she is lonely. Last psychiatrist she saw was in [place], [place] 4-5 yrs ago. She feels like the cymbalta helped some at first (a couple months ago) but not as much lately. She does not want me to change any psych meds or refer her anywhere right now. She feels like she needs to get motivated to get out and be active/exercise--and this will help.  She has made approp f/u with Dr. Cruzita Lederer to f/u her recent poor diabetic control.  Past Medical History  Diagnosis Date  . DM2 (diabetes mellitus, type 2) (HCC)     +hx of GDM both pregnancies, dx'd with DM 2 in her 54S--FKCLEX hx of complications  with eyes, kidneys, nerves, or CV system.  Marland Kitchen Hypertension   . Depression     MDD and borderline PD are her two main psych dx as per [facility] records--these records were handwritten and I could not decipher the script so I got no other helpful info beyond these two dx.  I have yet to see/hear any evidence of borderline personality d/o in her since she has been my pt.  . Borderline personality disorder   . DDD (degenerative disc disease), lumbar     Pt says she has hx of herniated disc that has resulted in some numbness in a few toes on left foot.  . Carpal tunnel syndrome on both sides   . Hyperlipidemia   . Obesity   . Daytime somnolence     Nuvigil rx'd by her psychiatrist.  Pt reports that no sleep  study has been done.  . Iron deficiency anemia 2013    Likely occult upper GI bleeding.  Hb 8.6--came up to 12 at most recent check after getting on integra, which she now continues once daily.   iFOB was negative 01/13/12.  She says she had been taking excessive NSAIDs at the time.  She has never had a colonoscopy.  . Multinodular goiter     Hx of multiple benign biopsies; repeated u/s's showed no change until the one on 04/2014.  FNA 06/2013 showed R and isthmus nodules intermediate so f/u bx 6 mo recommended.  Repeat bx 07/26/15 showed R lobe benign, isthmus atypical cells of undetermined signif,--detailed path analysis was done and it was BENIGN  . Anxiety     + ?mood disorder (awaiting old psych records as of 12/19/11.  . Low back pain 12/19/2011  . Abnormal nuclear stress test 12/31/11    Low risk lexiscan, but left heart cath was recommended and this showed normal coronary arteries and normal LV function. (01/01/12)  . History of vitamin D deficiency 2009    Past Surgical History  Procedure Laterality Date  . Cesarean section      X 2  . Fna bx thyroid nodules  06/29/14    x  4: some findings to support benign cyst and some "cellular atypica of indeterminate significance".--followed by Dr. Cruzita Lederer.  Repeat bx 05/2015 BENIGN.  Marland Kitchen Cardiac catheterization  01/01/12    Normal coronaries and normal LV function.    Outpatient Prescriptions Prior to Visit  Medication Sig Dispense Refill  . aspirin 81 MG tablet Take 81 mg by mouth daily.    Marland Kitchen atorvastatin (LIPITOR) 40 MG tablet Take 1 tablet (40 mg total) by mouth daily. 30 tablet 5  . Blood Glucose Monitoring Suppl (ACCU-CHEK AVIVA CONNECT) W/DEVICE KIT 1 kit by Does not apply route 2 (two) times daily. 1 kit 0  . busPIRone (BUSPAR) 5 MG tablet take 1/2 tablet by mouth three times a day 90 tablet 1  . butalbital-aspirin-caffeine (FIORINAL) 50-325-40 MG capsule take 1 to 2 capsules by mouth twice a day if needed for headache 60 capsule 1  . DULoxetine  (CYMBALTA) 60 MG capsule Take 1 capsule (60 mg total) by mouth daily. 30 capsule 2  . ferrous sulfate 325 (65 FE) MG tablet Take 325 mg by mouth daily with breakfast.    . glipiZIDE (GLUCOTROL XL) 5 MG 24 hr tablet take 2 tablet by mouth once daily WITH BREAKFAST 60 tablet 6  . hydrochlorothiazide (HYDRODIURIL) 25 MG tablet Take 1 tablet (25 mg total) by mouth daily. 30 tablet 6  . HYDROcodone-acetaminophen (NORCO/VICODIN) 5-325 MG tablet TAKE 1 TO 2 TABLETS EVERY 6 HOURS AS NEEDED FOR PAIN 120 tablet 0  . hydrocortisone (ANUSOL-HC) 2.5 % rectal cream Place 1 application rectally 2 (two) times daily. 35 g 3  . INVOKANA 100 MG TABS tablet take 1 tablet by mouth daily before the first meal of the day 30 tablet 0  . lamoTRIgine (LAMICTAL) 200 MG tablet Take 1 tablet (200 mg total) by mouth daily. 30 tablet 6  . lisinopril (PRINIVIL,ZESTRIL) 40 MG tablet take 1 tablet by mouth once daily 90 tablet 1  . LORazepam (ATIVAN) 0.5 MG tablet take 1 tablet by mouth twice a day 60 tablet 5  . metFORMIN (GLUCOPHAGE) 1000 MG tablet take 1 tablet by mouth twice a day for diabetes 180 tablet 1  . metoprolol tartrate (LOPRESSOR) 25 MG tablet take 1 tablet by mouth twice a day 180 tablet 3  . Multiple Vitamin (MULTIVITAMIN) tablet Take 1 tablet by mouth daily.    . Multiple Vitamins-Minerals (PRESERVISION AREDS) CAPS Take 1 capsule by mouth daily.    . Omega-3 Fatty Acids (FISH OIL) 1000 MG CAPS Take 1 capsule by mouth at bedtime.    Marland Kitchen omeprazole (PRILOSEC) 40 MG capsule Take 1 capsule (40 mg total) by mouth daily. 30 capsule 11  . promethazine (PHENERGAN) 12.5 MG tablet 1-2 tabs po q6h prn nausea 30 tablet 1   No facility-administered medications prior to visit.    Allergies  Allergen Reactions  . Levaquin [Levofloxacin In D5w] Hives  . Penicillins     Blistery rash  . Victoza [Liraglutide] Nausea Only    ROS As per HPI  PE: Blood pressure 108/70, pulse 84, temperature 98.7 F (37.1 C), temperature  source Oral, resp. rate 16, height 5' 6"  (1.676 m), weight 255 lb (115.667 kg), SpO2 95 %. Gen: Alert, well appearing.  Patient is oriented to person, place, time, and situation. AFFECT: sad, but lucid thought and speech  LABS:    Chemistry      Component Value Date/Time   NA 134* 12/19/2015 1633   K 3.9 12/19/2015 1633   CL 98 12/19/2015 1633  CO2 26 12/19/2015 1633   BUN 13 12/19/2015 1633   CREATININE 0.81 12/19/2015 1633   CREATININE 0.67 10/21/2013 1654      Component Value Date/Time   CALCIUM 9.5 12/19/2015 1633   ALKPHOS 83 12/19/2015 1633   AST 28 12/19/2015 1633   ALT 46* 12/19/2015 1633   BILITOT 0.3 12/19/2015 1633     Lab Results  Component Value Date   WBC 4.8 12/19/2015   HGB 13.8 12/19/2015   HCT 40.4 12/19/2015   MCV 93.7 12/19/2015   PLT 215.0 12/19/2015    IMPRESSION AND PLAN:  1) Nausea without vomiting: w/u unrevealing of a possible cause.  This has now spontaneously resolved.  2) Bipolar depression: per pt's preference, we will make no med changes and no referrals at this time. She is not doing very well, however.  I gave emotional support and encouraged her to follow through on her desire to get more physically active/exercise.   3) Right kidney lesion: hemorrhagic cyst vs mass.  Per radiologist, a CT abd w/out contrast is recommended so the lesion's hounsfield units can be compared to the contrast-enhanced pictures.  If no change, this favors cyst and we can stop w/u.  If it changes hounsfield units, then this favors solid mass in R kidney.  An After Visit Summary was printed and given to the patient.  FOLLOW UP: Return in about 4 months (around 04/29/2016) for routine chronic illness f/u.  Signed:  Crissie Sickles, MD           12/28/2015

## 2015-12-28 NOTE — Telephone Encounter (Signed)
Spoke to pt yesterday (12/28/15) and advised her to keep apt for today since she was due to come in for a 1 week f/u on nausea. Pt voiced understanding. I did advise her that we may not have the CT results at that time but we would call her with the results if they are not in by her apt time.

## 2016-01-04 ENCOUNTER — Ambulatory Visit
Admission: RE | Admit: 2016-01-04 | Discharge: 2016-01-04 | Disposition: A | Discharge status: Home / Self Care | Payer: PRIVATE HEALTH INSURANCE | Source: Ambulatory Visit | Attending: Family Medicine | Admitting: Family Medicine

## 2016-01-04 DIAGNOSIS — N289 Disorder of kidney and ureter, unspecified: Secondary | ICD-10-CM

## 2016-01-04 HISTORY — DX: Fatty (change of) liver, not elsewhere classified: K76.0

## 2016-01-04 HISTORY — DX: Cyst of kidney, acquired: N28.1

## 2016-01-07 ENCOUNTER — Institutional Professional Consult (permissible substitution): Payer: PRIVATE HEALTH INSURANCE | Admitting: Pulmonary Disease

## 2016-01-10 ENCOUNTER — Other Ambulatory Visit: Payer: Self-pay | Admitting: Family Medicine

## 2016-01-10 NOTE — Telephone Encounter (Signed)
RF request for buspirone LOV: 12/28/15 Next ov: 04/30/16 Last written: 09/10/15 #90 w/ 1RF

## 2016-01-14 ENCOUNTER — Other Ambulatory Visit: Payer: Self-pay | Admitting: Family Medicine

## 2016-01-14 ENCOUNTER — Encounter: Payer: Self-pay | Admitting: *Deleted

## 2016-01-14 NOTE — Telephone Encounter (Signed)
RF request for lisinopril LOV: 08/01/15 Next ov: 04/30/16 Last written: 07/16/15 #90 w/ 1RF

## 2016-01-25 ENCOUNTER — Other Ambulatory Visit: Payer: Self-pay | Admitting: Internal Medicine

## 2016-01-25 ENCOUNTER — Other Ambulatory Visit: Payer: Self-pay | Admitting: Family Medicine

## 2016-02-04 ENCOUNTER — Telehealth: Payer: Self-pay | Admitting: *Deleted

## 2016-02-04 NOTE — Telephone Encounter (Signed)
Pt LMOM on 01/26/16 at 4:21pm stating that she has changed jobs and her new insurance does not start til 03/16/16.  She stated that she is unable to afford the duloxetine ($200) and Invokana ($500). She wants to know if there is something else that can be called in that would be cheaper. Please advise. Thanks.

## 2016-02-04 NOTE — Telephone Encounter (Signed)
Pls eRx paroxetine 20mg , 1 tab po qd, #30, RF x 3--to take in place of her Cymbalta. She will have to call Dr. [name] and request a substitute diabetic med b/c she (Dr. [name]) is managing her diabetes.-thx

## 2016-02-05 NOTE — Telephone Encounter (Signed)
Tried calling pt, NA and unable to leave a message.  

## 2016-02-08 MED ORDER — PAROXETINE HCL 20 MG PO TABS: 20.0000 mg | ORAL_TABLET | Freq: Every day | ORAL | 3 refills | Status: DC

## 2016-02-08 NOTE — Telephone Encounter (Signed)
Pt advised and voiced understanding.  Rx sent.  

## 2016-02-11 ENCOUNTER — Telehealth: Payer: Self-pay | Admitting: Internal Medicine

## 2016-02-11 NOTE — Telephone Encounter (Signed)
Patient need a refill of Invokana but do not have insurance do you have any samples or can someone help her, please advise

## 2016-02-12 ENCOUNTER — Ambulatory Visit: Payer: PRIVATE HEALTH INSURANCE | Admitting: Internal Medicine

## 2016-02-13 ENCOUNTER — Telehealth: Payer: Self-pay

## 2016-02-13 NOTE — Telephone Encounter (Signed)
Called and notified patient we had a free trail card for invokana that she can use until her new insurance kicks in, I advised it was for 30 days. I will put her name on it and put it up front for her to come pick up. She stated she would. No other questions or concerns.

## 2016-02-25 ENCOUNTER — Other Ambulatory Visit: Payer: Self-pay | Admitting: Family Medicine

## 2016-03-18 ENCOUNTER — Other Ambulatory Visit: Payer: Self-pay | Admitting: Internal Medicine

## 2016-03-29 ENCOUNTER — Other Ambulatory Visit: Payer: Self-pay | Admitting: Family Medicine

## 2016-03-31 ENCOUNTER — Other Ambulatory Visit: Payer: Self-pay | Admitting: Family Medicine

## 2016-04-01 ENCOUNTER — Other Ambulatory Visit: Payer: Self-pay | Admitting: Internal Medicine

## 2016-04-08 ENCOUNTER — Ambulatory Visit (INDEPENDENT_AMBULATORY_CARE_PROVIDER_SITE_OTHER): Payer: PRIVATE HEALTH INSURANCE | Admitting: Internal Medicine

## 2016-04-08 ENCOUNTER — Other Ambulatory Visit: Payer: Self-pay | Admitting: Family Medicine

## 2016-04-08 ENCOUNTER — Other Ambulatory Visit: Payer: Self-pay

## 2016-04-08 ENCOUNTER — Encounter: Payer: Self-pay | Admitting: Internal Medicine

## 2016-04-08 VITALS — BP 124/78 | HR 85 | Ht 65.0 in | Wt 251.0 lb

## 2016-04-08 DIAGNOSIS — E042 Nontoxic multinodular goiter: Secondary | ICD-10-CM | POA: Diagnosis not present

## 2016-04-08 DIAGNOSIS — Z23 Encounter for immunization: Secondary | ICD-10-CM | POA: Diagnosis not present

## 2016-04-08 DIAGNOSIS — E1165 Type 2 diabetes mellitus with hyperglycemia: Secondary | ICD-10-CM | POA: Diagnosis not present

## 2016-04-08 DIAGNOSIS — IMO0001 Reserved for inherently not codable concepts without codable children: Secondary | ICD-10-CM

## 2016-04-08 LAB — POCT GLYCOSYLATED HEMOGLOBIN (HGB A1C): HEMOGLOBIN A1C: 12.1

## 2016-04-08 MED ORDER — INSULIN GLARGINE 100 UNIT/ML SOLOSTAR PEN: 20.0000 [IU] | PEN_INJECTOR | Freq: Every day | SUBCUTANEOUS | 3 refills | Status: DC

## 2016-04-08 MED ORDER — BAYER MICROLET 2 LANCING DEVIC MISC: 0 refills | Status: DC

## 2016-04-08 MED ORDER — GLUCOSE BLOOD VI STRP: ORAL_STRIP | 3 refills | Status: DC

## 2016-04-08 MED ORDER — BAYER CONTOUR MONITOR W/DEVICE KIT: PACK | 0 refills | Status: DC

## 2016-04-08 MED ORDER — INSULIN PEN NEEDLE 31G X 4 MM MISC: 1.0000 | Freq: Every day | 5 refills | Status: DC

## 2016-04-08 NOTE — Progress Notes (Signed)
Patient ID: Kristi Guerrero, female   DOB: [birth year], 55 y.o.   MRN: [id number]    HPI  Kristi Guerrero is a 55 y.o.-year-old female, returning for f/u for MNG and now DM2, uncontrolled. Last visit 10 mo ago.  She had a lot of stress in last year: depression, lost job, moving.  MNG: Pt was dx with thyroid nodules in 2006 >> saw endocrinology in [place].  Thyroid U/S (04/20/2014) - 4 nodules, 2 enlarged from previous U/S in 2006:  Right thyroid lobe: 5.5 x 2.6 x 3.3 cm. Complex interpolar region nodule measures 3.0 x 1.9 x 2.8 cm. It is predominately solid with asmall cystic area. On the previous report, this was described to be 1.5 x 1.1 x 1.7 cm.   Left thyroid lobe: 5.4 x 2.8 x 2.5 cm. 2.8 x 2.2 x 2.3 cm interpolar region solid heterogeneous nodule. This was previously described to measure 1.7 x 0.9 x 1.8 cm. Smaller lower pole nodule measures 1.5 x 1.4 x 1.3 cm there are some peripheral calcifications.   Isthmus Thickness: 4 mm. Solid 2.7 x 1.6 x 2.3 cm heterogeneous nodule. On the previous report, measurements of this nodule were 2.7 x 2.4 x 1.7 cm.   Lymphadenopathy: None visualized.  We Bx'ed the 4 nodules on 07/01/2014: - 2 x FLUS - 2 x benign  Repeated Bx's of the FLUS nodules on 07/26/2015: - R nodule: benign - isthmic nodule: FLUS/AUS  Afirma test for isthmic nodule on 07/26/2015: benign  I reviewed pt's thyroid tests: Lab Results  Component Value Date   TSH 1.13 12/19/2014   TSH 1.86 03/06/2014   TSH 1.294 03/31/2013   TSH 0.99 12/19/2011   FREET4 0.76 12/19/2011    Pt denies feeling nodules in neck, hoarseness, dysphagia/odynophagia, SOB with lying down.  Pt does have a + FH of follicular thyroid cancer in mother.   DM2: - had GDM x 2 - dx in 1990s  Last HbA1c:  Lab Results  Component Value Date   HGBA1C 6.7 06/13/2015   HGBA1C 8.2 (H) 11/17/2014   HGBA1C 8.4 (H) 06/19/2014   She was on: - Glipizide XL 15 mg in am - Actos 45 mg in am - Metformin  1000 mg 2x a day Tried Januvia >> expensive. Tried Byetta >> did not work well.  Tried Victoza >> nausea.  She is now on: - Invokana 100 mg in am. - Metformin 1000 mg 2x a day with meals. - Glipizide XL 10 mg in am. May miss doses...  Checks sugars rarely as she lost her meter. From last visit: - am: 120-180 - 2h after b'fast: n/c - lunch: 120s - 2h after lunch: n/c - dinner: 112-130s - 2h after dinner: n/c - bedtime: 180s ? Hypoglycemia awareness.  No CKD. Last BUN/Cr: Lab Results  Component Value Date   BUN 13 12/19/2015   Lab Results  Component Value Date   CREATININE 0.81 12/19/2015  On Lisinopril.  + HL. Last Lipid panel: Lab Results  Component Value Date   CHOL 140 12/19/2014   HDL 33.90 (L) 12/19/2014   LDLCALC 49 04/18/2014   LDLDIRECT 75.0 12/19/2014   TRIG 214.0 (H) 12/19/2014   CHOLHDL 4 12/19/2014  On Lipitor.  Last eye exam: 2015. No DR.  No neuropathy. Has a herniated disk >> numbness in toes since ~2009. Last foot exam: PCP.  ROS: Constitutional: no weight gain/loss, + fatigue, + Hot flashes, + nocturia Eyes: + blurry vision, no xerophthalmia ENT: no sore throat, no  nodules palpated in throat, no dysphagia/odynophagia, no hoarseness Cardiovascular: no CP/SOB/palpitations/+ leg swelling Respiratory: + cough/no SOB Gastrointestinal: no N/V/+ D/no C Musculoskeletal: No muscle/+ joint aches Skin: no rashes Neurological: no tremors/numbness/tingling/dizziness, no HA  I reviewed pt's medications, allergies, PMH, social hx, family hx, and changes were documented in the history of present illness. She started Cymbalta and HCTZ and start Prozac and Paxil since last visit. Otherwise, unchanged from my initial visit note:   Past Medical History:  Diagnosis Date  . Abnormal nuclear stress test 12/31/11   Low risk lexiscan, but left heart cath was recommended and this showed normal coronary arteries and normal LV function. (01/01/12)  . Anxiety    +  ?mood disorder (awaiting old psych records as of 12/19/11.  . Borderline personality disorder   . Carpal tunnel syndrome on both sides   . Daytime somnolence    Nuvigil rx'd by her psychiatrist.  Pt reports that no sleep study has been done.  . DDD (degenerative disc disease), lumbar    Pt says she has hx of herniated disc that has resulted in some numbness in a few toes on left foot.  . 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  I have yet to see/hear any evidence of borderline personality d/o in her since she has been my pt.  . DM2 (diabetes mellitus, type 2) (HCC)    +hx of GDM both pregnancies, dx'd with DM 2 in her 76H--YWVPXT hx of complications  with eyes, kidneys, nerves, or CV system.  . Hepatic steatosis   . History of vitamin D deficiency 2009  . Hyperlipidemia   . Hypertension   . Iron deficiency anemia 2013   Likely occult upper GI bleeding.  Hb 8.6--came up to 12 at most recent check after getting on integra, which she now continues once daily.   iFOB was negative 01/13/12.  She says she had been taking excessive NSAIDs at the time.  She has never had a colonoscopy.  . Low back pain 12/19/2011  . Multinodular goiter    Hx of multiple benign biopsies; repeated u/s's showed no change until the one on 04/2014.  FNA 06/2013 showed R and isthmus nodules intermediate so f/u bx 6 mo recommended.  Repeat bx 07/26/15 showed R lobe benign, isthmus atypical cells of undetermined signif,--detailed path analysis was done and it was BENIGN  . Obesity   . Renal cyst, acquired, right 2017   Past Surgical History:  Procedure Laterality Date  . CARDIAC CATHETERIZATION  01/01/12   Normal coronaries and normal LV function.  Marland Kitchen CESAREAN SECTION     X 2  . FNA bx Thyroid nodules  06/29/14   x 4: some findings to support  benign cyst and some "cellular atypica of indeterminate significance".--followed by Dr. Cruzita Lederer.  Repeat bx 05/2015 BENIGN.   History   Social History Main Topics  . Smoking status: Never Smoker   . Smokeless tobacco: Never Used  . Alcohol Use: No  . Drug Use: No   Social History Narrative   Divorced, 2 grown children (one in [place] and one in [place]).  One grandchild.   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   No T/A/Ds.   Exercise: occ goes to the [facility].   Currently living in  [place], [place].   Current Outpatient Prescriptions on File Prior to Visit  Medication Sig Dispense Refill  . aspirin 81 MG tablet Take 81 mg by mouth daily.    Marland Kitchen atorvastatin (LIPITOR) 40 MG tablet take 1 tablet by mouth once daily 30 tablet 6  . Blood Glucose Monitoring Suppl (ACCU-CHEK AVIVA CONNECT) W/DEVICE KIT 1 kit by Does not apply route 2 (two) times daily. 1 kit 0  . busPIRone (BUSPAR) 5 MG tablet take 1/2 tablet three times a day 90 tablet 1  . butalbital-aspirin-caffeine (FIORINAL) 50-325-40 MG capsule take 1 to 2 capsules by mouth twice a day if needed for headache 60 capsule 1  . DULoxetine (CYMBALTA) 60 MG capsule take 1 capsule once daily 30 capsule 3  . ferrous sulfate 325 (65 FE) MG tablet Take 325 mg by mouth daily with breakfast.    . glipiZIDE (GLUCOTROL XL) 5 MG 24 hr tablet take 2 tablets by mouth once daily WITH BREAKFAST 60 tablet 0  . hydrochlorothiazide (HYDRODIURIL) 25 MG tablet take 1 tablet by mouth once daily 30 tablet 6  . HYDROcodone-acetaminophen (NORCO/VICODIN) 5-325 MG tablet TAKE 1 TO 2 TABLETS EVERY 6 HOURS AS NEEDED FOR PAIN 120 tablet 0  . hydrocortisone (ANUSOL-HC) 2.5 % rectal cream Place 1 application rectally 2 (two) times daily. 35 g 3  . INVOKANA 100 MG TABS tablet take 1 tablet by mouth daily before the first meal of the day 30 tablet 0  . lamoTRIgine (LAMICTAL) 200 MG tablet Take 1 tablet (200 mg total) by mouth daily. 30 tablet 6  .  lisinopril (PRINIVIL,ZESTRIL) 40 MG tablet take 1 tablet by mouth once daily 90 tablet 1  . LORazepam (ATIVAN) 0.5 MG tablet take 1 tablet by mouth twice a day 60 tablet 5  . metFORMIN (GLUCOPHAGE) 1000 MG tablet take 1 tablet by mouth twice a day for diabetes 180 tablet 0  . metoprolol tartrate (LOPRESSOR) 25 MG tablet take 1 tablet by mouth twice a day 180 tablet 3  . Multiple Vitamin (MULTIVITAMIN) tablet Take 1 tablet by mouth daily.    . Multiple Vitamins-Minerals (PRESERVISION AREDS) CAPS Take 1 capsule by mouth daily.    . Omega-3 Fatty Acids (FISH OIL) 1000 MG CAPS Take 1 capsule by mouth at bedtime.    Marland Kitchen omeprazole (PRILOSEC) 40 MG capsule Take 1 capsule (40 mg total) by mouth daily. 30 capsule 11  . promethazine (PHENERGAN) 12.5 MG tablet 1-2 tabs po q6h prn nausea 30 tablet 1  . PARoxetine (PAXIL) 20 MG tablet Take 1 tablet (20 mg total) by mouth daily. (Patient not taking: Reported on 04/08/2016) 30 tablet 3   No current facility-administered medications on file prior to visit.    Allergies  Allergen Reactions  . Levaquin [Levofloxacin In D5w] Hives  . Penicillins     Blistery rash  . Victoza [Liraglutide] Nausea Only   Family History  Problem Relation Age of Onset  . Cancer Mother     breast and follicular thyroid cancer  . Depression Mother   . Diabetes Father   . Alcohol abuse Sister   . Hypertension Maternal Grandmother   . Hyperlipidemia Maternal Grandmother   . Heart disease Maternal Grandmother   . Stroke Maternal Grandmother   . Cancer Maternal Grandfather     colon cancer  . Stroke Maternal Grandfather   . Hypertension Maternal Grandfather   . Hyperlipidemia Maternal Grandfather   . Heart disease Maternal Grandfather    PE: BP 124/78 (BP Location:  Left Arm, Patient Position: Sitting)   Pulse 85   Ht 5' 5"  (1.651 m)   Wt 251 lb (113.9 kg)   SpO2 97%   BMI 41.77 kg/m  Body mass index is 41.77 kg/m. Wt Readings from Last 3 Encounters:  04/08/16 251 lb  (113.9 kg)  12/28/15 255 lb (115.7 kg)  12/19/15 259 lb 12 oz (117.8 kg)   Constitutional: overweight, in NAD Eyes: PERRLA, EOMI, no exophthalmos ENT: moist mucous membranes, + thyromegaly - R thyroid fullness and palpable isthmic thyroid nodule, no cervical lymphadenopathy Cardiovascular: RRR, No MRG Respiratory: CTA B Gastrointestinal: abdomen soft, NT, ND, BS+ Musculoskeletal: no deformities, strength intact in all 4;  Skin: moist, warm, no rashes Neurological: no tremor with outstretched hands, DTR normal in all 4  ASSESSMENT: 1. MNG - thyroid U/S (04/20/2014):   Adequacy Reason Satisfactory For Evaluation. Diagnosis THYROID, FINE NEEDLE ASPIRATION ISTHMUS, (SPECIMEN 1 OF 4, COLLECTED ON 06/29/2014) ATYPIA OF UNDETERMINED SIGNIFICANCE OR FOLLICULAR LESION OF UNDETERMINED SIGNIFICANCE (BETHESDA CATEGORY III). SEE COMMENT. COMMENT: THE SPECIMEN CONSISTS OF SMALL AND MEDIUM SIZED GROUPS OF FOLLICULAR EPITHELIAL CELLS AND SOME BACKGROUND COLLOID. SOME OF THE GROUPS OF CELLS ARE ARRANGED AS MICROFOLLICLES. THERE ARE SCATTERED INTRANUCLEAR GROOVES. BASED ON THESE FEATURES, A FOLLICULAR LESION/NEOPLASM CAN NOT BE ENTIRELY RULED OUT. Enid Cutter MD Pathologist, Electronic Signature (Case signed 06/30/2014) Specimen Clinical Information Solid 2.7 x 1.6 x 2.3cm heterogeneous nodule Source Thyroid, Fine Needle Aspiration, Isthmus, (Specimen 1 of 4, collected on 06/29/2014)   Adequacy Reason Satisfactory For Evaluation. Diagnosis THYROID, FINE NEEDLE ASPIRATION (SPECIMEN 2 OF 4 COLLECTED 06-29-2014) ATYPIA OF UNDETERMINED SIGNIFICANCE OR FOLLICULAR LESION OF UNDETERMINED SIGNIFICANCE (BETHESDA CATEGORY III). SEE COMMENT. COMMENT: THE SPECIMEN IS SOMEWHAT HYPOCELLULAR, HINDERING OPTIMAL CYTOLOGIC EVALUATION. HOWEVER, THERE ARE SCATTERED SMALL GROUPS OF FOLLICULAR EPITHELIAL CELLS WITH MILD CYTOLOGIC ATYPIA, INCLUDING CONSPICUOUS INTRANUCLEAR GROOVES. BASED ON THESE FEATURES, A  FOLLICULAR LESION/NEOPLASM CAN NOT BE ENTIRELY RULED OUT. Enid Cutter MD Pathologist, Electronic Signature (Case signed 06/30/2014) Specimen Clinical Information Right interpolar region nodule measures 3.0 x 1.9 x 2.8cm, It is predominantely solid with a small cystic area Source Thyroid, Fine Needle Aspiration, Right, (Specimen 2 of 4, collected on 06/29/2014)   Adequacy Reason Satisfactory For Evaluation. Diagnosis FINE NEEDLE ASPIRATION, THYROID, LLP (SPECIMEN 3 OF 4 COLLECTED ON 06/29/14): CONSISTENT WITH BENIGN FOLLICULAR NODULE (BETHESDA CATEGORY II). Enid Cutter MD Pathologist, Electronic Signature (Case signed 06/30/2014) Specimen Clinical Information Smaller llp nodule 1.5 x 1.4 x 1.3cm Source Thyroid, Fine Needle Aspiration, LLP, (Specimen 3 of 4, collected on 06/29/2014)   Adequacy Reason Satisfactory But Limited For Evaluation, Scant Cellularity. Diagnosis THYROID, FINE NEEDLE ASPIRATION: LEFT INTERPOLAR (4 OF 4 COLLECTED ON 11/15/930) SCANT FOLLICULAR EPITHELIUM PRESENT (BETHESDA CATEGORY I). FINDINGS CONSISTENT WITH THE CONTENTS OF A CYST. Enid Cutter MD Pathologist, Electronic Signature (Case signed 06/30/2014) Specimen Clinical Information 2.8 x 2.2 x 2.3cm interpolar region solid heterogeneous nodule Source Thyroid, Fine Needle Aspiration, Left Interpolar, (Specimen 4 of 4, collected on 06/29/2014)  (07/27/2015): Adequacy Reason Satisfactory For Evaluation. Diagnosis THYROID, FINE NEEDLE ASPIRATION RIGHT MID POLE, (SPECIMEN 1 OF 2 COLLECTED 07/26/2015) CONSISTENT WITH BENIGN FOLLICULAR NODULE (BETHESDA CATEGORY II). Enid Cutter MD Pathologist, Electronic Signature (Case signed 07/27/2015) Specimen Clinical Information Previous biospy 06/29/14, Dominant 33 x 25 x 31 mm mostly solid nodule, right mid lobe (previously 30 x 19 x 28) Source Thyroid, Fine Needle Aspiration, RMP, (Specimen 1 of 2, collected on 07/26/2015)  Adequacy Reason Satisfactory For  Evaluation. Diagnosis THYROID, FINE NEEDLE ASPIRATION, RIGHT ISTHMUS (  SPECIMEN 2 OF 2 COLLECTED 07-26-2015) ATYPIA OF UNDETERMINED SIGNIFICANCE OR FOLLICULAR LESION OF UNDETERMINED SIGNIFICANCE (BETHESDA CATEGORY III). SEE COMMENT. COMMENT: THE SPECIMEN IS MILDLY CELLULAR AND CONSISTS OF SMALL AND MEDIUM SIZED GROUPS OF FOLLICULAR EPITHELIAL CELLS WITH MINIMAL CYTOLOGIC ATYPIA INCLUDING NUCLEAR OVERLAPPING. A SAMPLE WILL BE SENT FOR AFFIRMA TESTING AND THE RESULTS REPORTED SEPARATELY. Enid Cutter MD Pathologist, Electronic Signature (Case signed 07/27/2015) Specimen Clinical Information Dominant 27 x 17 x 23 mm slightly echogenic solid nodule, right of midline (previously 27 x 16 x 23) Source Thyroid, Fine Needle Aspiration, Right Isthmus, (Specimen 2 of 2, collected on 07/26/2015)  The R thyroid nodule is benign.  The isthmic nodule is still a AUS/FLUS, but Afirma result is BENIGN. (MTC negative).   1. DM2, insulin-dependent, uncontrolled, without complications  PLAN: 1. MNG  - we reviewed her FNA results - explained that the right and isthmic thyroid nodules were initially intermediate (FLUS/AUS) at first Bx, then the R nodule second Bx was benign, while the isthmic nodule was again FLUS/AUS, however, the Afirma molecular marker was benign! - In this case, we will just need to follow the nodules with another U/S in 1-2 years.  1. DM2 Patient with long-standing, uncontrolled diabetes, on oral antidiabetic regimen, but not checking sugars and again presenting after a long absence. She is not completely compliant with her medicines, skipping doses. She is also not checking sugars. As expected, her HbA1c has increased from last level, of 6.7% to 12.1% today! - We will need to start basal insulin >> this may be a temporary measure for her, but she needs to be compliant with sugar checks, medications, and appointments to get her diabetes under control  - she knows how to inject insulin but we  reviewed basic technique - I advised her to: Patient Instructions  Please continue: - Invokana 100 mg in am. - Metformin 1000 mg 2x a day with meals. - Glipizide XL 10 mg in am.  Please start Lantus 14 units at bedtime: Increase to 18 units in 3 days if sugars in am are not <140. Increase by 22 units in 3 days if sugars in am are not <140.  Please check sugars 1-2x a day, rotating checks.  Please schedule an eye exam.  Please return in 1.5 months with your sugar log.    - Strongly advised her to start checking sugars at different times of the day - check 2 times a day, rotating checks - advised for yearly eye exams >> she needs one >> advised to schedule - given flu shot today - Return to clinic in 1.5 mo with sugar log  Philemon Kingdom, MD PhD [facility]

## 2016-04-08 NOTE — Patient Instructions (Addendum)
Please continue: - Invokana 100 mg in am. - Metformin 1000 mg 2x a day with meals. - Glipizide XL 10 mg in am.  Please start Lantus 14 units at bedtime: Increase to 18 units in 3 days if sugars in am are not <140. Increase by 22 units in 3 days if sugars in am are not <140.  Please check sugars 1-2x a day, rotating checks.  Please schedule an eye exam.  Please return in 1.5 months with your sugar log.

## 2016-04-10 ENCOUNTER — Encounter: Payer: Self-pay | Admitting: Family Medicine

## 2016-04-12 ENCOUNTER — Other Ambulatory Visit: Payer: Self-pay | Admitting: Family Medicine

## 2016-04-30 ENCOUNTER — Ambulatory Visit (INDEPENDENT_AMBULATORY_CARE_PROVIDER_SITE_OTHER): Payer: BLUE CROSS/BLUE SHIELD | Admitting: Family Medicine

## 2016-04-30 ENCOUNTER — Encounter: Payer: Self-pay | Admitting: Family Medicine

## 2016-04-30 VITALS — BP 105/69 | HR 78 | Temp 98.1°F | Resp 16 | Wt 249.8 lb

## 2016-04-30 DIAGNOSIS — F3341 Major depressive disorder, recurrent, in partial remission: Secondary | ICD-10-CM | POA: Diagnosis not present

## 2016-04-30 DIAGNOSIS — E7800 Pure hypercholesterolemia, unspecified: Secondary | ICD-10-CM | POA: Diagnosis not present

## 2016-04-30 DIAGNOSIS — I10 Essential (primary) hypertension: Secondary | ICD-10-CM

## 2016-04-30 NOTE — Progress Notes (Signed)
OFFICE VISIT  04/30/2016   CC:  Chief Complaint  Patient presents with  . Follow-up    depression   HPI:    Patient is a 55 y.o. Caucasian female who presents for 4 mo f/u depression.   Just got back from a cruise, had a great time. Mood has still be depressed, but says she is now living in her mom's house in [place] and is closer to some family so she is hopeful that this helps things.  Says her situational depression/anxiety is improved lately. Says she wants to stick with current meds right now: lamictal, cymbalta, buspar. 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  Same job, different facility per pt report today.  HTN: no home monitoring but compliant with meds.  HLD: was off statin inadvertently per pt, not sure how long, but she got herself back on it.  Past Medical History:  Diagnosis Date  . Abnormal nuclear stress test 12/31/11   Low risk lexiscan, but left heart cath was recommended and this showed normal coronary arteries and normal LV function. (01/01/12)  . Anxiety    + ?mood disorder (awaiting old psych records as of 12/19/11.  . Borderline personality disorder   . Carpal tunnel syndrome on both sides   . Daytime somnolence    Nuvigil rx'd by her psychiatrist.  Pt reports that no sleep study has been done.  . DDD (degenerative disc disease), lumbar    Pt says she has hx of herniated disc that has resulted in some numbness in a few toes on left foot.  . 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  I have yet to see/hear any evidence of borderline personality d/o in her since she has been my pt.  . DM2 (diabetes mellitus, type 2) ([place])    Managed by ENDO--Dr. Cruzita Lederer.  +hx of GDM both pregnancies, dx'd with DM 2 in her 16X--WRUEAV hx of complications  with eyes, kidneys, nerves, or CV  system.  . Hepatic steatosis   . History of vitamin D deficiency 2009  . Hyperlipidemia   . Hypertension   . Iron deficiency anemia 2013   Likely occult upper GI bleeding.  Hb 8.6--came up to 12 at most recent check after getting on integra, which she now continues once daily.   iFOB was negative 01/13/12.  She says she had been taking excessive NSAIDs at the time.  She has never had a colonoscopy.  . Low back pain 12/19/2011  . Multinodular goiter    Hx of multiple benign biopsies; repeated u/s's showed no change until the one on 04/2014.  FNA 06/2013 showed R and isthmus nodules intermediate so f/u bx 6 mo recommended.  Repeat bx 07/26/15 showed R lobe benign, isthmus atypical cells of undetermined signif,--detailed path analysis was done and it was BENIGN.  Plan per endo is repeat u/s 1-2 yrs from date of most recent biopsy.  . Obesity   . Renal cyst, acquired, right 2017    Past Surgical History:  Procedure Laterality Date  . CARDIAC CATHETERIZATION  01/01/12   Normal coronaries and normal LV function.  Marland Kitchen CESAREAN SECTION     X 2  . FNA bx Thyroid nodules  06/29/14   x 4: some findings to support benign cyst and some "cellular atypica  of indeterminate significance".--followed by Dr. Cruzita Lederer.  Repeat bx 05/2015 BENIGN.    Outpatient Medications Prior to Visit  Medication Sig Dispense Refill  . aspirin 81 MG tablet Take 81 mg by mouth daily.    Marland Kitchen atorvastatin (LIPITOR) 40 MG tablet take 1 tablet by mouth once daily 30 tablet 6  . Blood Glucose Monitoring Suppl (ACCU-CHEK AVIVA CONNECT) W/DEVICE KIT 1 kit by Does not apply route 2 (two) times daily. 1 kit 0  . Blood Glucose Monitoring Suppl (BAYER CONTOUR MONITOR) w/Device KIT Use to check sugar 2 times daily. 1 each 0  . busPIRone (BUSPAR) 5 MG tablet take 1/2 tablet three times a day 90 tablet 1  . butalbital-aspirin-caffeine (FIORINAL) 50-325-40 MG capsule take 1 to 2 capsules by mouth twice a day if needed for headache 60 capsule 1  .  DULoxetine (CYMBALTA) 60 MG capsule take 1 capsule once daily 30 capsule 3  . ferrous sulfate 325 (65 FE) MG tablet Take 325 mg by mouth daily with breakfast.    . glipiZIDE (GLUCOTROL XL) 5 MG 24 hr tablet take 2 tablets by mouth once daily WITH BREAKFAST 60 tablet 0  . glucose blood (BAYER CONTOUR TEST) test strip Use as instructed to check sugar 2 times daily. 200 each 3  . hydrochlorothiazide (HYDRODIURIL) 25 MG tablet take 1 tablet by mouth once daily 30 tablet 6  . HYDROcodone-acetaminophen (NORCO/VICODIN) 5-325 MG tablet TAKE 1 TO 2 TABLETS EVERY 6 HOURS AS NEEDED FOR PAIN 120 tablet 0  . Insulin Glargine (LANTUS SOLOSTAR) 100 UNIT/ML Solostar Pen Inject 20 Units into the skin daily at 10 pm. 15 mL 3  . Insulin Pen Needle 31G X 4 MM MISC 1 each by Does not apply route at bedtime. 100 each 5  . INVOKANA 100 MG TABS tablet take 1 tablet by mouth daily before the first meal of the day 30 tablet 0  . lamoTRIgine (LAMICTAL) 200 MG tablet take 1 tablet by mouth once daily 30 tablet 6  . Lancet Devices (BAYER MICROLET 2 LANCING DEVIC) MISC Use to check sugar two times daily. 1 each 0  . lisinopril (PRINIVIL,ZESTRIL) 40 MG tablet take 1 tablet by mouth once daily 90 tablet 1  . LORazepam (ATIVAN) 0.5 MG tablet take 1 tablet by mouth twice a day 60 tablet 5  . metFORMIN (GLUCOPHAGE) 1000 MG tablet take 1 tablet by mouth twice a day for diabetes 180 tablet 0  . metoprolol tartrate (LOPRESSOR) 25 MG tablet take 1 tablet by mouth twice a day 180 tablet 3  . Multiple Vitamin (MULTIVITAMIN) tablet Take 1 tablet by mouth daily.    . Multiple Vitamins-Minerals (PRESERVISION AREDS) CAPS Take 1 capsule by mouth daily.    . Omega-3 Fatty Acids (FISH OIL) 1000 MG CAPS Take 1 capsule by mouth at bedtime.    Marland Kitchen omeprazole (PRILOSEC) 40 MG capsule Take 1 capsule (40 mg total) by mouth daily. 30 capsule 11  . PROCTOSOL HC 2.5 % rectal cream apply rectally twice a day 30 g 3  . promethazine (PHENERGAN) 12.5 MG  tablet 1-2 tabs po q6h prn nausea 30 tablet 1  . PARoxetine (PAXIL) 20 MG tablet Take 1 tablet (20 mg total) by mouth daily. (Patient not taking: Reported on 04/30/2016) 30 tablet 3   No facility-administered medications prior to visit.     Allergies  Allergen Reactions  . Levaquin [Levofloxacin In D5w] Hives  . Penicillins     Blistery rash  . Victoza [Liraglutide] Nausea  Only    ROS As per HPI  PE: Blood pressure 105/69, pulse 78, temperature 98.1 F (36.7 C), temperature source Temporal, resp. rate 16, weight 249 lb 12.8 oz (113.3 kg). Gen: Alert, well appearing.  Patient is oriented to person, place, time, and situation. AFFECT: pleasant, lucid thought and speech. No further exam today.  LABS:    Chemistry      Component Value Date/Time   NA 134 (L) 12/19/2015 1633   K 3.9 12/19/2015 1633   CL 98 12/19/2015 1633   CO2 26 12/19/2015 1633   BUN 13 12/19/2015 1633   CREATININE 0.81 12/19/2015 1633   CREATININE 0.67 10/21/2013 1654      Component Value Date/Time   CALCIUM 9.5 12/19/2015 1633   ALKPHOS 83 12/19/2015 1633   AST 28 12/19/2015 1633   ALT 46 (H) 12/19/2015 1633   BILITOT 0.3 12/19/2015 1633     Lab Results  Component Value Date   CHOL 140 12/19/2014   HDL 33.90 (L) 12/19/2014   LDLCALC 49 04/18/2014   LDLDIRECT 75.0 12/19/2014   TRIG 214.0 (H) 12/19/2014   CHOLHDL 4 12/19/2014   Lab Results  Component Value Date   HGBA1C 12.1 04/08/2016   IMPRESSION AND PLAN:  1) MDD: fairly stable, although not in remission.  She has changed her living and working situation for the better. She wants to make no changes to med regimen today. Signs/symptoms to call or return for were reviewed and pt expressed understanding.  2) HTN: The current medical regimen is effective;  continue present plan and medications.  3) Hyperlipidemia: tolerating statin.  Due for FLP monitoring but pt not fasting today.  An After Visit Summary was printed and given to the  patient.  FOLLOW UP: Return in about 4 months (around 08/28/2016) for annual CPE (fasting).  Signed:  Crissie Sickles, MD           04/30/2016

## 2016-04-30 NOTE — Progress Notes (Signed)
Pre visit review using our clinic review tool, if applicable. No additional management support is needed unless otherwise documented below in the visit note. 

## 2016-05-04 ENCOUNTER — Other Ambulatory Visit: Payer: Self-pay | Admitting: Internal Medicine

## 2016-05-09 ENCOUNTER — Other Ambulatory Visit: Payer: Self-pay | Admitting: Family Medicine

## 2016-05-10 ENCOUNTER — Other Ambulatory Visit: Payer: Self-pay | Admitting: Family Medicine

## 2016-05-12 ENCOUNTER — Other Ambulatory Visit: Payer: Self-pay | Admitting: Family Medicine

## 2016-05-14 NOTE — Telephone Encounter (Signed)
glipiZIDE Rx [facility]

## 2016-05-26 ENCOUNTER — Other Ambulatory Visit: Payer: Self-pay | Admitting: Family Medicine

## 2016-05-26 NOTE — Telephone Encounter (Signed)
RF request for buspirone LOV: 04/30/16 Next ov: 08/21/16 Last written: 01/10/16 #90 w/ 1RF

## 2016-05-30 ENCOUNTER — Ambulatory Visit: Payer: PRIVATE HEALTH INSURANCE | Admitting: Internal Medicine

## 2016-06-10 ENCOUNTER — Other Ambulatory Visit: Payer: Self-pay | Admitting: Family Medicine

## 2016-06-10 ENCOUNTER — Other Ambulatory Visit: Payer: Self-pay | Admitting: Internal Medicine

## 2016-06-11 NOTE — Telephone Encounter (Signed)
RF request for omeprazole LOV: 04/30/16 Next ov: None Last written: 05/16/15 #30 w/ 11RF

## 2016-07-22 ENCOUNTER — Encounter: Payer: Self-pay | Admitting: Internal Medicine

## 2016-07-22 ENCOUNTER — Ambulatory Visit (INDEPENDENT_AMBULATORY_CARE_PROVIDER_SITE_OTHER): Payer: BLUE CROSS/BLUE SHIELD | Admitting: Internal Medicine

## 2016-07-22 VITALS — BP 132/90 | HR 77 | Ht 65.5 in | Wt 248.0 lb

## 2016-07-22 DIAGNOSIS — E1165 Type 2 diabetes mellitus with hyperglycemia: Secondary | ICD-10-CM | POA: Diagnosis not present

## 2016-07-22 DIAGNOSIS — IMO0001 Reserved for inherently not codable concepts without codable children: Secondary | ICD-10-CM

## 2016-07-22 DIAGNOSIS — E042 Nontoxic multinodular goiter: Secondary | ICD-10-CM

## 2016-07-22 LAB — POCT GLYCOSYLATED HEMOGLOBIN (HGB A1C): Hemoglobin A1C: 10.2

## 2016-07-22 MED ORDER — INSULIN GLARGINE 100 UNIT/ML SOLOSTAR PEN: 30.0000 [IU] | PEN_INJECTOR | Freq: Every day | SUBCUTANEOUS | 3 refills | Status: DC

## 2016-07-22 NOTE — Progress Notes (Signed)
Patient ID: Kristi Guerrero, female   DOB: 02-Sep-1960, 56 y.o.   MRN: [id number]    HPI  Kristi Guerrero is a 56 y.o.-year-old female, returning for f/u for MNG and insulin-dependent DM2, uncontrolled. Last visit 3.5 mo ago.  DM2: - had GDM x 2 - dx in 1990s  Last HbA1c:  Lab Results  Component Value Date   HGBA1C 12.1 04/08/2016   HGBA1C 6.7 06/13/2015   HGBA1C 8.2 (H) 11/17/2014   She was on: - Glipizide XL 15 mg in am - Actos 45 mg in am - Metformin 1000 mg 2x a day Tried Januvia >> expensive. Tried Byetta >> did not work well.  Tried Victoza >> nausea.  She is now on: - Invokana 100 mg in am. - Metformin 1000 mg 2x a day with meals. - Glipizide XL 10 mg in am. - Lantus 24 units at bedtime >> misses 2 doses a week!  Checks sugars rarely - again no meter, no log (!) - am: 120-180 >> 180-240 - 2h after b'fast: n/c - lunch: 120s >> n/c - 2h after lunch: n/c - dinner: 112-130s >> mid200s - 2h after dinner: n/c >> upper 200s - bedtime: 180s ? Hypoglycemia awareness.  No CKD. Last BUN/Cr: Lab Results  Component Value Date   BUN 13 12/19/2015   Lab Results  Component Value Date   CREATININE 0.81 12/19/2015  On Lisinopril.  + HL. Last Lipid panel: Lab Results  Component Value Date   CHOL 140 12/19/2014   HDL 33.90 (L) 12/19/2014   LDLCALC 49 04/18/2014   LDLDIRECT 75.0 12/19/2014   TRIG 214.0 (H) 12/19/2014   CHOLHDL 4 12/19/2014  On Lipitor.  Last eye exam: 2015. No DR.  No neuropathy. Has a herniated disk >> numbness in toes since ~2009. Last foot exam: PCP.  MNG: Pt was dx with thyroid nodules in 2006 >> saw endocrinology in [place].  Thyroid U/S (04/20/2014) - 4 nodules, 2 enlarged from previous U/S in 2006:  Right thyroid lobe: 5.5 x 2.6 x 3.3 cm. Complex interpolar region nodule measures 3.0 x 1.9 x 2.8 cm. It is predominately solid with asmall cystic area. On the previous report, this was described to be 1.5 x 1.1 x 1.7 cm.   Left thyroid lobe:  5.4 x 2.8 x 2.5 cm. 2.8 x 2.2 x 2.3 cm interpolar region solid heterogeneous nodule. This was previously described to measure 1.7 x 0.9 x 1.8 cm. Smaller lower pole nodule measures 1.5 x 1.4 x 1.3 cm there are some peripheral calcifications.   Isthmus Thickness: 4 mm. Solid 2.7 x 1.6 x 2.3 cm heterogeneous nodule. On the previous report, measurements of this nodule were 2.7 x 2.4 x 1.7 cm.   Lymphadenopathy: None visualized.  We Bx'ed the 4 nodules on 07/01/2014: - 2 x FLUS - 2 x benign  Repeated Bx's of the FLUS nodules on 07/26/2015: - R nodule: benign - isthmic nodule: FLUS/AUS  Afirma test for isthmic nodule on 07/26/2015: benign  I reviewed pt's latest thyroid test: Lab Results  Component Value Date   TSH 1.13 12/19/2014    Pt denies feeling nodules in neck, hoarseness, dysphagia/odynophagia, SOB with lying down.  Pt does have a + FH of follicular thyroid cancer in mother.   ROS: Constitutional: no weight gain/loss, + fatigue, + Hot flashes, + nocturia Eyes: no blurry vision, no xerophthalmia ENT: no sore throat, no nodules palpated in throat, no dysphagia/odynophagia, no hoarseness Cardiovascular: no CP/SOB/palpitations/ leg swelling Respiratory: no cough/SOB Gastrointestinal:  no N/V/D/C Musculoskeletal: No muscle/joint aches Skin: no rashes Neurological: no tremors/numbness/tingling/dizziness, no HA  I reviewed pt's medications, allergies, PMH, social hx, family hx, and changes were documented in the history of present illness. Otherwise, unchanged from my initial visit note:   Past Medical History:  Diagnosis Date  . Abnormal nuclear stress test 12/31/11   Low risk lexiscan, but left heart cath was recommended and this showed normal coronary arteries and normal LV function. (01/01/12)  . Anxiety    + ?mood disorder (awaiting old psych records as of 12/19/11.  . Borderline personality disorder   . Carpal tunnel syndrome on both sides   . Daytime somnolence     Nuvigil rx'd by her psychiatrist.  Pt reports that no sleep study has been done.  . DDD (degenerative disc disease), lumbar    Pt says she has hx of herniated disc that has resulted in some numbness in a few toes on left foot.  . 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  I have yet to see/hear any evidence of borderline personality d/o in her since she has been my pt.  . DM2 (diabetes mellitus, type 2) ([place])    Managed by ENDO--Dr. Cruzita Lederer.  +hx of GDM both pregnancies, dx'd with DM 2 in her 22L--[id number] hx of complications  with eyes, kidneys, nerves, or CV system.  . Hepatic steatosis   . History of vitamin D deficiency 2009  . Hyperlipidemia   . Hypertension   . Iron deficiency anemia 2013   Likely occult upper GI bleeding.  Hb 8.6--came up to 12 at most recent check after getting on integra, which she now continues once daily.   iFOB was negative 01/13/12.  She says she had been taking excessive NSAIDs at the time.  She has never had a colonoscopy.  . Low back pain 12/19/2011  . Multinodular goiter    Hx of multiple benign biopsies; repeated u/s's showed no change until the one on 04/2014.  FNA 06/2013 showed R and isthmus nodules intermediate so f/u bx 6 mo recommended.  Repeat bx 07/26/15 showed R lobe benign, isthmus atypical cells of undetermined signif,--detailed path analysis was done and it was BENIGN.  Plan per endo is repeat u/s 1-2 yrs from date of most recent biopsy.  . Obesity   . Renal cyst, acquired, right 2017   Past Surgical History:  Procedure Laterality Date  . CARDIAC CATHETERIZATION  01/01/12   Normal coronaries and normal LV function.  Marland Kitchen CESAREAN SECTION     X 2  . FNA bx Thyroid nodules  06/29/14   x 4: some findings to support benign cyst and some "cellular atypica of indeterminate  significance".--followed by Dr. Cruzita Lederer.  Repeat bx 05/2015 BENIGN.   History   Social History Main Topics  . Smoking status: Never Smoker   . Smokeless tobacco: Never Used  . Alcohol Use: No  . Drug Use: No   Social History Narrative   Divorced, 2 grown children (one in [place] and one in [place]).  One grandchild.   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   No T/A/Ds.   Exercise: occ goes to the [facility].   Currently living in [place], [place].   Current Outpatient Prescriptions on File Prior to Visit  Medication  Sig Dispense Refill  . aspirin 81 MG tablet Take 81 mg by mouth daily.    Marland Kitchen atorvastatin (LIPITOR) 40 MG tablet take 1 tablet by mouth once daily 30 tablet 6  . Blood Glucose Monitoring Suppl (ACCU-CHEK AVIVA CONNECT) W/DEVICE KIT 1 kit by Does not apply route 2 (two) times daily. 1 kit 0  . Blood Glucose Monitoring Suppl (BAYER CONTOUR MONITOR) w/Device KIT Use to check sugar 2 times daily. 1 each 0  . busPIRone (BUSPAR) 5 MG tablet take 1/2 tablet three times a day 90 tablet 1  . butalbital-aspirin-caffeine (FIORINAL) 50-325-40 MG capsule take 1 to 2 capsules by mouth twice a day if needed for headache 60 capsule 1  . DULoxetine (CYMBALTA) 60 MG capsule take 1 capsule once daily 30 capsule 3  . ferrous sulfate 325 (65 FE) MG tablet Take 325 mg by mouth daily with breakfast.    . glipiZIDE (GLUCOTROL XL) 5 MG 24 hr tablet take 2 tablets by mouth once daily WITH BREAKFAST 60 tablet 6  . glucose blood (BAYER CONTOUR TEST) test strip Use as instructed to check sugar 2 times daily. 200 each 3  . hydrochlorothiazide (HYDRODIURIL) 25 MG tablet take 1 tablet by mouth once daily 30 tablet 6  . HYDROcodone-acetaminophen (NORCO/VICODIN) 5-325 MG tablet TAKE 1 TO 2 TABLETS EVERY 6 HOURS AS NEEDED FOR PAIN 120 tablet 0  . Insulin Glargine (LANTUS SOLOSTAR) 100 UNIT/ML Solostar Pen Inject 20 Units into the skin daily at 10 pm. 15 mL 3  . Insulin Pen Needle 31G X 4 MM  MISC 1 each by Does not apply route at bedtime. 100 each 5  . INVOKANA 100 MG TABS tablet TAKE ONE TABLET BY MOUTH ONCE DAILY BEFORE THE FIRST MEAL OF THE DAY 30 tablet 0  . lamoTRIgine (LAMICTAL) 200 MG tablet take 1 tablet by mouth once daily 30 tablet 6  . Lancet Devices (BAYER MICROLET 2 LANCING DEVIC) MISC Use to check sugar two times daily. 1 each 0  . lisinopril (PRINIVIL,ZESTRIL) 40 MG tablet take 1 tablet by mouth once daily 90 tablet 1  . LORazepam (ATIVAN) 0.5 MG tablet take 1 tablet by mouth twice a day 60 tablet 5  . metFORMIN (GLUCOPHAGE) 1000 MG tablet take 1 tablet by mouth twice a day for diabetes 180 tablet 0  . metoprolol tartrate (LOPRESSOR) 25 MG tablet take 1 tablet by mouth twice a day 180 tablet 3  . Multiple Vitamin (MULTIVITAMIN) tablet Take 1 tablet by mouth daily.    . Multiple Vitamins-Minerals (PRESERVISION AREDS) CAPS Take 1 capsule by mouth daily.    . Omega-3 Fatty Acids (FISH OIL) 1000 MG CAPS Take 1 capsule by mouth at bedtime.    Marland Kitchen omeprazole (PRILOSEC) 40 MG capsule take 1 capsule by mouth once daily 30 capsule 11  . PROCTOSOL HC 2.5 % rectal cream apply rectally twice a day 30 g 3  . promethazine (PHENERGAN) 12.5 MG tablet 1-2 tabs po q6h prn nausea 30 tablet 1   No current facility-administered medications on file prior to visit.    Allergies  Allergen Reactions  . Levaquin [Levofloxacin In D5w] Hives  . Penicillins     Blistery rash  . Victoza [Liraglutide] Nausea Only   Family History  Problem Relation Age of Onset  . Cancer Mother     breast and follicular thyroid cancer  . Depression Mother   . Diabetes Father   . Alcohol abuse Sister   . Hypertension Maternal Grandmother   .  Hyperlipidemia Maternal Grandmother   . Heart disease Maternal Grandmother   . Stroke Maternal Grandmother   . Cancer Maternal Grandfather     colon cancer  . Stroke Maternal Grandfather   . Hypertension Maternal Grandfather   . Hyperlipidemia Maternal Grandfather    . Heart disease Maternal Grandfather    PE: BP 132/90 (BP Location: Left Arm, Patient Position: Sitting)   Pulse 77   Ht 5' 5.5" (1.664 m)   Wt 248 lb (112.5 kg)   SpO2 98%   BMI 40.64 kg/m  Body mass index is 40.64 kg/m. Wt Readings from Last 3 Encounters:  07/22/16 248 lb (112.5 kg)  04/30/16 249 lb 12.8 oz (113.3 kg)  04/08/16 251 lb (113.9 kg)   Constitutional: overweight, in NAD Eyes: PERRLA, EOMI, no exophthalmos ENT: moist mucous membranes, + thyromegaly - R thyroid fullness and palpable isthmic thyroid nodule, no cervical lymphadenopathy Cardiovascular: RRR, No MRG Respiratory: CTA B Gastrointestinal: abdomen soft, NT, ND, BS+ Musculoskeletal: no deformities, strength intact in all 4;  Skin: moist, warm, no rashes Neurological: no tremor with outstretched hands, DTR normal in all 4  ASSESSMENT: 1. DM2, insulin-dependent, uncontrolled, without complications  2. MNG - thyroid U/S (04/20/2014):   Adequacy Reason Satisfactory For Evaluation. Diagnosis THYROID, FINE NEEDLE ASPIRATION ISTHMUS, (SPECIMEN 1 OF 4, COLLECTED ON 06/29/2014) ATYPIA OF UNDETERMINED SIGNIFICANCE OR FOLLICULAR LESION OF UNDETERMINED SIGNIFICANCE (BETHESDA CATEGORY III). SEE COMMENT. COMMENT: THE SPECIMEN CONSISTS OF SMALL AND MEDIUM SIZED GROUPS OF FOLLICULAR EPITHELIAL CELLS AND SOME BACKGROUND COLLOID. SOME OF THE GROUPS OF CELLS ARE ARRANGED AS MICROFOLLICLES. THERE ARE SCATTERED INTRANUCLEAR GROOVES. BASED ON THESE FEATURES, A FOLLICULAR LESION/NEOPLASM CAN NOT BE ENTIRELY RULED OUT. Enid Cutter MD Pathologist, Electronic Signature (Case signed 06/30/2014) Specimen Clinical Information Solid 2.7 x 1.6 x 2.3cm heterogeneous nodule Source Thyroid, Fine Needle Aspiration, Isthmus, (Specimen 1 of 4, collected on 06/29/2014)   Adequacy Reason Satisfactory For Evaluation. Diagnosis THYROID, FINE NEEDLE ASPIRATION (SPECIMEN 2 OF 4 COLLECTED 06-29-2014) ATYPIA OF UNDETERMINED  SIGNIFICANCE OR FOLLICULAR LESION OF UNDETERMINED SIGNIFICANCE (BETHESDA CATEGORY III). SEE COMMENT. COMMENT: THE SPECIMEN IS SOMEWHAT HYPOCELLULAR, HINDERING OPTIMAL CYTOLOGIC EVALUATION. HOWEVER, THERE ARE SCATTERED SMALL GROUPS OF FOLLICULAR EPITHELIAL CELLS WITH MILD CYTOLOGIC ATYPIA, INCLUDING CONSPICUOUS INTRANUCLEAR GROOVES. BASED ON THESE FEATURES, A FOLLICULAR LESION/NEOPLASM CAN NOT BE ENTIRELY RULED OUT. Enid Cutter MD Pathologist, Electronic Signature (Case signed 06/30/2014) Specimen Clinical Information Right interpolar region nodule measures 3.0 x 1.9 x 2.8cm, It is predominantely solid with a small cystic area Source Thyroid, Fine Needle Aspiration, Right, (Specimen 2 of 4, collected on 06/29/2014)   Adequacy Reason Satisfactory For Evaluation. Diagnosis FINE NEEDLE ASPIRATION, THYROID, LLP (SPECIMEN 3 OF 4 COLLECTED ON 06/29/14): CONSISTENT WITH BENIGN FOLLICULAR NODULE (BETHESDA CATEGORY II). Enid Cutter MD Pathologist, Electronic Signature (Case signed 06/30/2014) Specimen Clinical Information Smaller llp nodule 1.5 x 1.4 x 1.3cm Source Thyroid, Fine Needle Aspiration, LLP, (Specimen 3 of 4, collected on 06/29/2014)   Adequacy Reason Satisfactory But Limited For Evaluation, Scant Cellularity. Diagnosis THYROID, FINE NEEDLE ASPIRATION: LEFT INTERPOLAR (4 OF 4 COLLECTED ON 2/44/6286) SCANT FOLLICULAR EPITHELIUM PRESENT (BETHESDA CATEGORY I). FINDINGS CONSISTENT WITH THE CONTENTS OF A CYST. Enid Cutter MD Pathologist, Electronic Signature (Case signed 06/30/2014) Specimen Clinical Information 2.8 x 2.2 x 2.3cm interpolar region solid heterogeneous nodule Source Thyroid, Fine Needle Aspiration, Left Interpolar, (Specimen 4 of 4, collected on 06/29/2014)  (07/27/2015): Adequacy Reason Satisfactory For Evaluation. Diagnosis THYROID, FINE NEEDLE ASPIRATION RIGHT MID POLE, (SPECIMEN 1 OF 2 COLLECTED 07/26/2015) CONSISTENT WITH  BENIGN FOLLICULAR NODULE (BETHESDA  CATEGORY II). Enid Cutter MD Pathologist, Electronic Signature (Case signed 07/27/2015) Specimen Clinical Information Previous biospy 06/29/14, Dominant 33 x 25 x 31 mm mostly solid nodule, right mid lobe (previously 30 x 19 x 28) Source Thyroid, Fine Needle Aspiration, RMP, (Specimen 1 of 2, collected on 07/26/2015)  Adequacy Reason Satisfactory For Evaluation. Diagnosis THYROID, FINE NEEDLE ASPIRATION, RIGHT ISTHMUS (SPECIMEN 2 OF 2 COLLECTED 07-26-2015) ATYPIA OF UNDETERMINED SIGNIFICANCE OR FOLLICULAR LESION OF UNDETERMINED SIGNIFICANCE (BETHESDA CATEGORY III). SEE COMMENT. COMMENT: THE SPECIMEN IS MILDLY CELLULAR AND CONSISTS OF SMALL AND MEDIUM SIZED GROUPS OF FOLLICULAR EPITHELIAL CELLS WITH MINIMAL CYTOLOGIC ATYPIA INCLUDING NUCLEAR OVERLAPPING. A SAMPLE WILL BE SENT FOR AFFIRMA TESTING AND THE RESULTS REPORTED SEPARATELY. Enid Cutter MD Pathologist, Electronic Signature (Case signed 07/27/2015) Specimen Clinical Information Dominant 27 x 17 x 23 mm slightly echogenic solid nodule, right of midline (previously 27 x 16 x 23) Source Thyroid, Fine Needle Aspiration, Right Isthmus, (Specimen 2 of 2, collected on 07/26/2015)  The R thyroid nodule is benign.  The isthmic nodule is still a AUS/FLUS, but Afirma result is BENIGN. (MTC negative).   PLAN:  1. DM2 Patient with long-standing, uncontrolled diabetes, on oral antidiabetic regimen, but not checking sugars and not compliant with appointments. She is also not checking sugars and is not compliant with medicines, skipping doses frequently. As expected, her HbA1c has increased from 6.7% to 12.1% at last visit.  At last visit, we started basal insulin. However, she forgets this 2x a week as she falls asleep >> advised her to move it to after dinner and to try taking it consistently. She also needs to bring her meter or write sugars down. For now, will increase Lantus. - I advised her to: Patient Instructions  Please continue: -  Invokana 100 mg in am. - Metformin 1000 mg 2x a day with meals. - Glipizide XL 10 mg in am.  Please increase: - Lantus 30 units at bedtime  Please schedule an eye exam.  Please return in 1.5 months with your sugar log.    - Strongly advised her to start checking sugars at different times of the day - check 2 times a day, rotating checks - advised for yearly eye exams >> she needs one >> again advised to schedule - given flu shot at last visit  - Return to clinic in 1.5 mo with sugar log  2. MNG  - the right and isthmic thyroid nodules were initially intermediate (FLUS/AUS) at first Bx, then the R nodule second Bx was benign, while the isthmic nodule was again FLUS/AUS, however, the Afirma molecular marker was benign! - we will just need to follow the nodules with another U/S in 1-2 years.  Philemon Kingdom, MD PhD [facility]

## 2016-07-22 NOTE — Addendum Note (Signed)
Addended by: Darene LamerHOMPSON, Jona Zappone T on: 07/22/2016 04:47 PM   Modules accepted: Orders

## 2016-07-22 NOTE — Patient Instructions (Addendum)
Please continue: - Invokana 100 mg in am. - Metformin 1000 mg 2x a day with meals. - Glipizide XL 10 mg in am.  Please increase: - Lantus 30 units at bedtime  Please schedule an eye exam.  Please return in 1.5 months with your sugar log.

## 2016-07-24 ENCOUNTER — Other Ambulatory Visit: Payer: Self-pay | Admitting: Internal Medicine

## 2016-08-21 ENCOUNTER — Encounter: Payer: BLUE CROSS/BLUE SHIELD | Admitting: Family Medicine

## 2016-08-23 ENCOUNTER — Other Ambulatory Visit: Payer: Self-pay | Admitting: Internal Medicine

## 2016-08-23 ENCOUNTER — Other Ambulatory Visit: Payer: Self-pay | Admitting: Family Medicine

## 2016-09-02 ENCOUNTER — Encounter: Payer: BLUE CROSS/BLUE SHIELD | Admitting: Family Medicine

## 2016-09-02 ENCOUNTER — Other Ambulatory Visit: Payer: Self-pay | Admitting: *Deleted

## 2016-09-02 ENCOUNTER — Other Ambulatory Visit (INDEPENDENT_AMBULATORY_CARE_PROVIDER_SITE_OTHER): Payer: BLUE CROSS/BLUE SHIELD

## 2016-09-02 DIAGNOSIS — Z0000 Encounter for general adult medical examination without abnormal findings: Secondary | ICD-10-CM

## 2016-09-02 DIAGNOSIS — R7989 Other specified abnormal findings of blood chemistry: Secondary | ICD-10-CM

## 2016-09-02 LAB — COMPREHENSIVE METABOLIC PANEL
ALT: 15 U/L (ref 0–35)
AST: 12 U/L (ref 0–37)
Albumin: 4.2 g/dL (ref 3.5–5.2)
Alkaline Phosphatase: 90 U/L (ref 39–117)
BUN: 22 mg/dL (ref 6–23)
CALCIUM: 10 mg/dL (ref 8.4–10.5)
CHLORIDE: 94 meq/L — AB (ref 96–112)
CO2: 29 meq/L (ref 19–32)
CREATININE: 0.71 mg/dL (ref 0.40–1.20)
GFR: 90.48 mL/min (ref >60.00)
Glucose, Bld: 300 mg/dL — ABNORMAL HIGH (ref 70–99)
Potassium: 3.7 mEq/L (ref 3.5–5.1)
SODIUM: 134 meq/L — AB (ref 135–145)
Total Bilirubin: 0.5 mg/dL (ref 0.2–1.2)
Total Protein: 7.2 g/dL (ref 6.0–8.3)

## 2016-09-02 LAB — CBC WITH DIFFERENTIAL/PLATELET
BASOS PCT: 0.6 % (ref 0.0–3.0)
Basophils Absolute: 0 10*3/uL (ref 0.0–0.1)
EOS ABS: 0.1 10*3/uL (ref 0.0–0.7)
Eosinophils Relative: 0.9 % (ref 0.0–5.0)
HCT: 43.4 % (ref 36.0–46.0)
Hemoglobin: 15 g/dL (ref 12.0–15.0)
Lymphocytes Relative: 37.3 % (ref 12.0–46.0)
Lymphs Abs: 2.2 10*3/uL (ref 0.7–4.0)
MCHC: 34.5 g/dL (ref 30.0–36.0)
MCV: 90.8 fl (ref 78.0–100.0)
Monocytes Absolute: 0.4 10*3/uL (ref 0.1–1.0)
Monocytes Relative: 6.6 % (ref 3.0–12.0)
NEUTROS ABS: 3.2 10*3/uL (ref 1.4–7.7)
Neutrophils Relative %: 54.6 % (ref 43.0–77.0)
PLATELETS: 226 10*3/uL (ref 150.0–400.0)
RBC: 4.78 Mil/uL (ref 3.87–5.11)
RDW: 13.6 % (ref 11.5–15.5)
WBC: 5.9 10*3/uL (ref 4.0–10.5)

## 2016-09-02 LAB — LIPID PANEL
CHOL/HDL RATIO: 7
Cholesterol: 260 mg/dL — ABNORMAL HIGH (ref 0–200)
HDL: 34.7 mg/dL — ABNORMAL LOW (ref >39.00)
NonHDL: 225.12
TRIGLYCERIDES: 389 mg/dL — AB (ref 0.0–149.0)
VLDL: 77.8 mg/dL — AB (ref 0.0–40.0)

## 2016-09-02 LAB — LDL CHOLESTEROL, DIRECT: Direct LDL: 173 mg/dL

## 2016-09-02 LAB — TSH: TSH: 1.68 u[IU]/mL (ref 0.35–4.50)
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
## 2016-09-06 ENCOUNTER — Other Ambulatory Visit: Payer: Self-pay | Admitting: Family Medicine

## 2016-09-15 ENCOUNTER — Encounter: Payer: Self-pay | Admitting: Family Medicine

## 2016-09-15 ENCOUNTER — Ambulatory Visit: Payer: BLUE CROSS/BLUE SHIELD | Admitting: Internal Medicine

## 2016-09-15 ENCOUNTER — Ambulatory Visit (INDEPENDENT_AMBULATORY_CARE_PROVIDER_SITE_OTHER): Payer: BLUE CROSS/BLUE SHIELD | Admitting: Family Medicine

## 2016-09-15 VITALS — BP 127/80 | HR 74 | Temp 98.2°F | Resp 16 | Ht 65.5 in | Wt 248.2 lb

## 2016-09-15 DIAGNOSIS — Z1211 Encounter for screening for malignant neoplasm of colon: Secondary | ICD-10-CM

## 2016-09-15 DIAGNOSIS — E782 Mixed hyperlipidemia: Secondary | ICD-10-CM

## 2016-09-15 DIAGNOSIS — Z0000 Encounter for general adult medical examination without abnormal findings: Secondary | ICD-10-CM

## 2016-09-15 DIAGNOSIS — N3946 Mixed incontinence: Secondary | ICD-10-CM

## 2016-09-15 MED ORDER — OXYBUTYNIN CHLORIDE ER 5 MG PO TB24: ORAL_TABLET | ORAL | 3 refills | Status: DC

## 2016-09-15 NOTE — Progress Notes (Signed)
Pre visit review using our clinic review tool, if applicable. No additional management support is needed unless otherwise documented below in the visit note. 
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
## 2016-09-30 ENCOUNTER — Other Ambulatory Visit: Payer: Self-pay | Admitting: Family Medicine

## 2016-09-30 DIAGNOSIS — Z1231 Encounter for screening mammogram for malignant neoplasm of breast: Secondary | ICD-10-CM

## 2016-10-02 ENCOUNTER — Other Ambulatory Visit: Payer: Self-pay | Admitting: Family Medicine

## 2016-10-02 NOTE — Telephone Encounter (Signed)
[facility] [place], [place].  New  request for lorazepam LOV: 09/15/16 Next ov: 12/15/16 Last written: 12/13/15 #60 w/ 5Rf  Please advise. Thanks.

## 2016-10-03 ENCOUNTER — Ambulatory Visit
Admission: RE | Admit: 2016-10-03 | Discharge: 2016-10-03 | Disposition: A | Discharge status: Home / Self Care | Payer: BLUE CROSS/BLUE SHIELD | Source: Ambulatory Visit | Attending: Family Medicine | Admitting: Family Medicine

## 2016-10-03 DIAGNOSIS — Z1231 Encounter for screening mammogram for malignant neoplasm of breast: Secondary | ICD-10-CM

## 2016-10-03 NOTE — Telephone Encounter (Signed)
Rx faxed

## 2016-10-07 ENCOUNTER — Other Ambulatory Visit: Payer: Self-pay | Admitting: Family Medicine

## 2016-10-07 ENCOUNTER — Other Ambulatory Visit: Payer: Self-pay | Admitting: Internal Medicine

## 2016-10-08 ENCOUNTER — Encounter: Payer: Self-pay | Admitting: Family Medicine

## 2016-10-10 ENCOUNTER — Telehealth: Payer: Self-pay

## 2016-10-10 MED ORDER — OXYBUTYNIN CHLORIDE ER 10 MG PO TB24: ORAL_TABLET | ORAL | 3 refills | Status: DC

## 2016-10-10 NOTE — Telephone Encounter (Signed)
Prior authorization requested by [employer] through pharmacy.  PA was denied to the amount of medication.  Medication was changed from  bid to 10 mg qd per Dr. Milinda Cave. Pharmacy notified and medication was cleared and approved by [employer]. Medication changed on med list.

## 2016-10-23 ENCOUNTER — Ambulatory Visit: Payer: BLUE CROSS/BLUE SHIELD | Admitting: Internal Medicine

## 2016-10-28 LAB — HM PAP SMEAR: HM PAP: NORMAL

## 2016-11-14 ENCOUNTER — Encounter: Payer: Self-pay | Admitting: Family Medicine

## 2016-11-24 ENCOUNTER — Telehealth: Payer: Self-pay | Admitting: Family Medicine

## 2016-11-24 MED ORDER — OXYBUTYNIN CHLORIDE ER 10 MG PO TB24: ORAL_TABLET | ORAL | 1 refills | Status: DC

## 2016-11-24 MED ORDER — LISINOPRIL 40 MG PO TABS: 40.0000 mg | ORAL_TABLET | Freq: Every day | ORAL | 1 refills | Status: DC

## 2016-11-24 NOTE — Telephone Encounter (Signed)
Patient's pharmacy [facility] is closing tomorrow. Please send in Lisinopril & Oxybutynin today.

## 2016-11-24 NOTE — Telephone Encounter (Signed)
Pt advised and voiced understanding.   

## 2016-11-24 NOTE — Telephone Encounter (Signed)
RF request for lisinopril LOV: 09/15/16 Next ov: 01/16/17 Last written: 01/14/16 #90 w/ [id number]  RF request for oxybutynin Last written: 10/10/16 #30 w/ 3RF

## 2016-12-15 ENCOUNTER — Ambulatory Visit: Payer: BLUE CROSS/BLUE SHIELD | Admitting: Family Medicine

## 2016-12-22 ENCOUNTER — Telehealth: Payer: Self-pay | Admitting: Internal Medicine

## 2016-12-22 ENCOUNTER — Telehealth: Payer: Self-pay

## 2016-12-22 NOTE — Telephone Encounter (Signed)
Called to notify patient that we had samples [facility] that was the same as Invokana that she can come pick up at the office. Patient had no voicemail to leave message, will try again later.

## 2016-12-22 NOTE — Telephone Encounter (Signed)
Called and notified patient of the changes in the medication. She is coming to pick up the samples. No other questions at this time.

## 2016-12-22 NOTE — Telephone Encounter (Signed)
Called and notified patient of the changes in the medication. She is coming to pick up the samples. No other questions at this time. 

## 2016-12-22 NOTE — Telephone Encounter (Signed)
I have some samples of Steglatro 5 mg daily, which is the same class. >> she can pick these up. After 08/01 [facility]

## 2016-12-22 NOTE — Telephone Encounter (Signed)
Called to notify patient that we had samples of the Steglatro that was the same as Invokana that she can come pick up at the office. Patient had no voicemail to leave message, will try again later.   

## 2016-12-22 NOTE — Telephone Encounter (Signed)
Patient is currently uninsured and will not have insurance until 01/14/2017. She wants to know - do you have any samples of invokana - if not, is there a cheaper alternative you would be willing to call in to [facility] on [address]  Verified cell #

## 2016-12-22 NOTE — Telephone Encounter (Signed)
Please advise. Thank you

## 2016-12-26 ENCOUNTER — Ambulatory Visit: Payer: BLUE CROSS/BLUE SHIELD | Admitting: Internal Medicine

## 2016-12-30 ENCOUNTER — Other Ambulatory Visit: Payer: Self-pay | Admitting: Family Medicine

## 2016-12-31 NOTE — Telephone Encounter (Signed)
[facility] [place]  RF request for duloxetine LOV: 09/15/16 Next ov: 01/16/17 Last written: 08/25/16 #30 w/ 2RF

## 2017-01-16 ENCOUNTER — Encounter: Payer: Self-pay | Admitting: Family Medicine

## 2017-01-16 ENCOUNTER — Ambulatory Visit: Payer: BLUE CROSS/BLUE SHIELD | Admitting: Family Medicine

## 2017-01-20 ENCOUNTER — Telehealth: Payer: Self-pay | Admitting: Internal Medicine

## 2017-01-20 ENCOUNTER — Other Ambulatory Visit: Payer: Self-pay | Admitting: Family Medicine

## 2017-01-20 ENCOUNTER — Other Ambulatory Visit: Payer: Self-pay

## 2017-01-20 MED ORDER — BUSPIRONE HCL 5 MG PO TABS: ORAL_TABLET | ORAL | 1 refills | Status: DC

## 2017-01-20 MED ORDER — CANAGLIFLOZIN 100 MG PO TABS: ORAL_TABLET | ORAL | 1 refills | Status: DC

## 2017-01-20 NOTE — Telephone Encounter (Signed)
Called patient to advise that RX was submitted. Patient had no voicemail to leave message.

## 2017-01-20 NOTE — Telephone Encounter (Signed)
Patient requesting rf of buspar to [facility] [place] [place] - I added new pharmacy.

## 2017-01-20 NOTE — Telephone Encounter (Signed)
Called patient to advise that RX was submitted. Patient had no voicemail to leave message. 

## 2017-01-20 NOTE — Telephone Encounter (Signed)
**  Remind patient they can make refill requests via MyChart**  Medication refill request (Name & Dosage):  INVOKANA 100 MG TABS tablet  Preferred pharmacy (Name & Address):  [id number] [place] - [address] [phone] (Phone) [phone] (Fax)   Other comments (if applicable):   Patient notified that pharmacy will call once rx is ready

## 2017-01-23 ENCOUNTER — Ambulatory Visit: Payer: BLUE CROSS/BLUE SHIELD | Admitting: Family Medicine

## 2017-01-30 ENCOUNTER — Telehealth: Payer: Self-pay | Admitting: Internal Medicine

## 2017-01-30 ENCOUNTER — Ambulatory Visit (INDEPENDENT_AMBULATORY_CARE_PROVIDER_SITE_OTHER): Payer: 59 | Admitting: Family Medicine

## 2017-01-30 ENCOUNTER — Encounter: Payer: Self-pay | Admitting: *Deleted

## 2017-01-30 ENCOUNTER — Encounter: Payer: Self-pay | Admitting: Family Medicine

## 2017-01-30 VITALS — BP 101/66 | HR 73 | Temp 97.5°F | Resp 16 | Ht 65.5 in | Wt 242.2 lb

## 2017-01-30 DIAGNOSIS — I10 Essential (primary) hypertension: Secondary | ICD-10-CM

## 2017-01-30 DIAGNOSIS — Z23 Encounter for immunization: Secondary | ICD-10-CM

## 2017-01-30 DIAGNOSIS — E7800 Pure hypercholesterolemia, unspecified: Secondary | ICD-10-CM | POA: Diagnosis not present

## 2017-01-30 DIAGNOSIS — N3946 Mixed incontinence: Secondary | ICD-10-CM

## 2017-01-30 DIAGNOSIS — E042 Nontoxic multinodular goiter: Secondary | ICD-10-CM | POA: Diagnosis not present

## 2017-01-30 DIAGNOSIS — G894 Chronic pain syndrome: Secondary | ICD-10-CM

## 2017-01-30 LAB — BASIC METABOLIC PANEL
BUN: 19 mg/dL (ref 6–23)
CALCIUM: 9.3 mg/dL (ref 8.4–10.5)
CO2: 28 mEq/L (ref 19–32)
Chloride: 97 mEq/L (ref 96–112)
Creatinine, Ser: 0.45 mg/dL (ref 0.40–1.20)
GFR: 152.91 mL/min (ref >60.00)
Glucose, Bld: 360 mg/dL — ABNORMAL HIGH (ref 70–99)
Potassium: 4.1 mEq/L (ref 3.5–5.1)
SODIUM: 133 meq/L — AB (ref 135–145)

## 2017-01-30 LAB — TSH: TSH: 0.75 u[IU]/mL (ref 0.35–4.50)

## 2017-01-30 LAB — LIPID PANEL
CHOL/HDL RATIO: 8
CHOLESTEROL: 280 mg/dL — AB (ref 0–200)
HDL: 35.5 mg/dL — ABNORMAL LOW (ref >39.00)

## 2017-01-30 LAB — LDL CHOLESTEROL, DIRECT: Direct LDL: 194 mg/dL

## 2017-01-30 MED ORDER — ZOSTER VAC RECOMB ADJUVANTED 50 MCG/0.5ML IM SUSR: 0.5000 mL | Freq: Once | INTRAMUSCULAR | 0 refills | Status: AC

## 2017-01-30 MED ORDER — HYDROCODONE-ACETAMINOPHEN 5-325 MG PO TABS: ORAL_TABLET | ORAL | 0 refills | Status: DC

## 2017-01-30 MED ORDER — OXYBUTYNIN CHLORIDE ER 10 MG PO TB24: ORAL_TABLET | ORAL | 3 refills | Status: DC

## 2017-01-30 NOTE — Progress Notes (Signed)
OFFICE VISIT  01/30/2017   CC:  Chief Complaint  Patient presents with  . Follow-up    RCI, pt is fasting   HPI:    Patient is a 56 y.o. Caucasian female who presents for 5 mo f/u HTN, hyperlipidemia, chronic pain syndrome and mixed stress/urge incontinence. Last visit I rx'd a trial of ditropan xl.  She says this worked taking 1 tab once daily and then insurance ran out b/c of switching jobs--now needs a new rx. She reported at last f/u visit that she was only about 50% compliant with all her meds, so when chol came back high we chose to simply try to have her take meds with more compliance rather than increase/change meds. Taking atorvastatin about 75% of the time.  BP monitoring: none at work or home. Started new job at [employer] in [place]. Not exercising any.   Diet: not working on anything in particular right now.  Indication for chronic opioid: chronic low back pain. Medication and dose: vicodin 5/325 (last rx'd by me 12/2015) # pills per month: 120 Last UDS date: 04/21/13 Pain contract signed (Y/N): Y (03/31/13)--will update today. Date narcotic database last reviewed (include red flags): today, no red flags.  Has hx of multiple thyroid nodules (benign on evals)--she asks for repeat of TSH today.  Her DM 2 is managed by her endocrinologist, Dr. Cruzita Lederer.  Past Medical History:  Diagnosis Date  . Abnormal nuclear stress test 12/31/11   Low risk lexiscan, but left heart cath was recommended and this showed normal coronary arteries and normal LV function. (01/01/12)  . Anxiety    + ?mood disorder (awaiting old psych records as of 12/19/11.  . Borderline personality disorder   . Carpal tunnel syndrome on both sides   . Colon cancer screening    Pt did not return cologuard 10/2016.  She has chosen not to get colonoscopy as of 2018 f/u.  Marland Kitchen Daytime somnolence    Nuvigil rx'd by her psychiatrist.  Pt reports that no sleep study has been done.  . DDD (degenerative disc  disease), lumbar    Pt says she has hx of herniated disc that has resulted in some numbness in a few toes on left foot.  . 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  I have yet to see/hear any evidence of borderline personality d/o in her since she has been my pt.  . DM2 (diabetes mellitus, type 2) ([place])    Managed by ENDO--Dr. Cruzita Lederer.  +hx of GDM both pregnancies, dx'd with DM 2 in her 28N--OMVEHM hx of complications  with eyes, kidneys, nerves, or CV system.  . Hepatic steatosis   . History of vitamin D deficiency 2009  . Hyperlipidemia   . Hypertension   . Iron deficiency anemia 2013   Likely occult upper GI bleeding.  Hb 8.6--came up to 12 at most recent check after getting on integra, which she now continues once daily.   iFOB was negative 01/13/12.  She says she had been taking excessive NSAIDs at the time.  She has never had a colonoscopy.  . Low back pain 12/19/2011  . Migraine syndrome   . Multinodular goiter    Hx of multiple benign biopsies; repeated u/s's showed no change until the one on 04/2014.  FNA 06/2013  showed R and isthmus nodules intermediate so f/u bx 6 mo recommended.  Repeat bx 07/26/15 showed R lobe benign, isthmus atypical cells of undetermined signif,--detailed path analysis was done and it was BENIGN.  Plan per endo is repeat u/s 1-2 yrs from date of most recent biopsy.  . Obesity   . Renal cyst, acquired, right 2017    Past Surgical History:  Procedure Laterality Date  . CARDIAC CATHETERIZATION  01/01/12   Normal coronaries and normal LV function.  Marland Kitchen CESAREAN SECTION     X 2  . FNA bx Thyroid nodules  06/29/14   x 4: some findings to support benign cyst and some "cellular atypica of indeterminate significance".--followed by Dr. Cruzita Lederer.  Repeat bx 05/2015 BENIGN.    Outpatient  Medications Prior to Visit  Medication Sig Dispense Refill  . aspirin 81 MG tablet Take 81 mg by mouth daily.    Marland Kitchen atorvastatin (LIPITOR) 40 MG tablet take 1 tablet by mouth once daily 30 tablet 6  . Blood Glucose Monitoring Suppl (ACCU-CHEK AVIVA CONNECT) W/DEVICE KIT 1 kit by Does not apply route 2 (two) times daily. 1 kit 0  . Blood Glucose Monitoring Suppl (BAYER CONTOUR MONITOR) w/Device KIT Use to check sugar 2 times daily. 1 each 0  . busPIRone (BUSPAR) 5 MG tablet take 1/2 tablet three times a day 90 tablet 1  . butalbital-acetaminophen-caffeine (FIORICET, ESGIC) 50-325-40 MG tablet take 1 to 2 tablets by mouth twice a day if needed for headache 60 tablet 1  . butalbital-aspirin-caffeine (FIORINAL) 50-325-40 MG capsule take 1 to 2 capsules by mouth twice a day if needed for headache 60 capsule 1  . canagliflozin (INVOKANA) 100 MG TABS tablet take 1 tablet by mouth daily before the first meal of the day 30 tablet 1  . DULoxetine (CYMBALTA) 60 MG capsule TAKE ONE CAPSULE BY MOUTH ONCE EVERY DAY 30 capsule 3  . ferrous sulfate 325 (65 FE) MG tablet Take 325 mg by mouth daily with breakfast.    . glipiZIDE (GLUCOTROL XL) 5 MG 24 hr tablet take 2 tablets by mouth once daily WITH BREAKFAST 60 tablet 6  . glucose blood (BAYER CONTOUR TEST) test strip Use as instructed to check sugar 2 times daily. 200 each 3  . hydrochlorothiazide (HYDRODIURIL) 25 MG tablet TAKE 1 TABLET BY MOUTH ONCE DAILY 30 tablet 1  . Insulin Glargine (LANTUS SOLOSTAR) 100 UNIT/ML Solostar Pen Inject 30 Units into the skin daily at 10 pm. 15 mL 3  . Insulin Pen Needle 31G X 4 MM MISC 1 each by Does not apply route at bedtime. 100 each 5  . lamoTRIgine (LAMICTAL) 200 MG tablet take 1 tablet by mouth once daily 30 tablet 6  . Lancet Devices (BAYER MICROLET 2 LANCING DEVIC) MISC Use to check sugar two times daily. 1 each 0  . lisinopril (PRINIVIL,ZESTRIL) 40 MG tablet Take 1 tablet (40 mg total) by mouth daily. 90 tablet 1  .  LORazepam (ATIVAN) 0.5 MG tablet take 1 tablet by mouth twice a day 60 tablet 5  . metFORMIN (GLUCOPHAGE) 1000 MG tablet take 1 tablet by mouth twice a day for diabetes 180 tablet 1  . metoprolol tartrate (LOPRESSOR) 25 MG tablet take 1 tablet by mouth twice a day 180 tablet 3  . Multiple Vitamin (MULTIVITAMIN) tablet Take 1 tablet by mouth daily.    . Multiple Vitamins-Minerals (PRESERVISION AREDS) CAPS Take 1 capsule by mouth daily.    . Omega-3 Fatty Acids (FISH OIL)  1000 MG CAPS Take 1 capsule by mouth at bedtime.    Marland Kitchen omeprazole (PRILOSEC) 40 MG capsule take 1 capsule by mouth once daily 30 capsule 11  . PROCTOSOL HC 2.5 % rectal cream apply rectally twice a day 30 g 3  . promethazine (PHENERGAN) 12.5 MG tablet 1-2 tabs po q6h prn nausea 30 tablet 1  . HYDROcodone-acetaminophen (NORCO/VICODIN) 5-325 MG tablet TAKE 1 TO 2 TABLETS EVERY 6 HOURS AS NEEDED FOR PAIN 120 tablet 0  . oxybutynin (DITROPAN-XL) 10 MG 24 hr tablet 2 tabs po qd (Patient taking differently: Take 10 mg by mouth daily. 2 tabs po qd) 180 tablet 1   No facility-administered medications prior to visit.     Allergies  Allergen Reactions  . Levaquin [Levofloxacin In D5w] Hives  . Penicillins     Blistery rash  . Victoza [Liraglutide] Nausea Only    ROS As per HPI  PE: Blood pressure 101/66, pulse 73, temperature (!) 97.5 F (36.4 C), temperature source Oral, resp. rate 16, height 5' 5.5" (1.664 m), weight 242 lb 4 oz (109.9 kg), last menstrual period 06/04/2013, SpO2 97 %. Gen: Alert, well appearing.  Patient is oriented to person, place, time, and situation. AFFECT: pleasant, lucid thought and speech. No further exam today.  LABS:  Lab Results  Component Value Date   TSH 1.68 09/02/2016   Lab Results  Component Value Date   WBC 5.9 09/02/2016   HGB 15.0 09/02/2016   HCT 43.4 09/02/2016   MCV 90.8 09/02/2016   PLT 226.0 09/02/2016   Lab Results  Component Value Date   CREATININE 0.71 09/02/2016    BUN 22 09/02/2016   NA 134 (L) 09/02/2016   K 3.7 09/02/2016   CL 94 (L) 09/02/2016   CO2 29 09/02/2016   Lab Results  Component Value Date   ALT 15 09/02/2016   AST 12 09/02/2016   ALKPHOS 90 09/02/2016   BILITOT 0.5 09/02/2016   Lab Results  Component Value Date   CHOL 260 (H) 09/02/2016   Lab Results  Component Value Date   HDL 34.70 (L) 09/02/2016   Lab Results  Component Value Date   LDLCALC 49 04/18/2014   Lab Results  Component Value Date   TRIG 389.0 (H) 09/02/2016   Lab Results  Component Value Date   CHOLHDL 7 09/02/2016   Lab Results  Component Value Date   HGBA1C 10.2 07/22/2016   IMPRESSION AND PLAN:  1) HTN; The current medical regimen is effective;  continue present plan and medications. Lytes/cr today.  2) Hyperlipidemia, mixed: with better compliance last 4 mo; will check fasting lipid panel today.  3) Mixed urge/stress incontinence: signif improved with use of 1 ditropan xl 10 mg tab qd---new rx for this today.  4) Chronic pain syndrome:  Pain contract renewed today. Will obtain UDS next f/u visit in 3 mo. Vicodin 5/325, 1-2 q6h prn, #120 rx'd today.  5) Multiple thyroid nodules: bx's of these have been benign so far. Followed by Dr. Cruzita Lederer in endo for these.  Will monitor TSH today.  An After Visit Summary was printed and given to the patient.  FOLLOW UP: Return in about 3 months (around 05/02/2017) for routine chronic illness f/u.  Signed:  Crissie Sickles, MD           01/30/2017

## 2017-01-30 NOTE — Telephone Encounter (Signed)
MEDICATION: Insulin Glargine (LANTUS SOLOSTAR) 100 UNIT/ML Solostar Pen  PHARMACY:  [id number] - [place], [place] - [address] [phone] (Phone) [phone] (Fax)    IS THIS A 90 DAY SUPPLY : "same as last time"  IS PATIENT OUT OF MEDICTAION: yes  IF NOT; HOW MUCH IS LEFT: n/a  LAST APPOINTMENT DATE:07/22/16  NEXT APPOINTMENT DATE:03/13/17  OTHER COMMENTS:    **Let patient know to contact pharmacy at the end of the day to make sure medication is ready. **  ** Please notify patient to allow 48-72 hours to process**  **Encourage patient to contact the pharmacy for refills or they can request refills through [facility]**

## 2017-02-02 ENCOUNTER — Other Ambulatory Visit: Payer: Self-pay | Admitting: *Deleted

## 2017-02-02 MED ORDER — METFORMIN HCL 1000 MG PO TABS: ORAL_TABLET | ORAL | 1 refills | Status: DC

## 2017-02-02 MED ORDER — ATORVASTATIN CALCIUM 80 MG PO TABS
40.0000 mg | ORAL_TABLET | Freq: Every day | ORAL | 0 refills | Status: DC
Start: 2017-02-02 — End: 2017-04-02

## 2017-02-03 ENCOUNTER — Other Ambulatory Visit: Payer: Self-pay

## 2017-02-03 MED ORDER — INSULIN GLARGINE 100 UNIT/ML SOLOSTAR PEN: 30.0000 [IU] | PEN_INJECTOR | Freq: Every day | SUBCUTANEOUS | 3 refills | Status: DC

## 2017-02-03 NOTE — Telephone Encounter (Signed)
Submitted into the pharmacy.   

## 2017-02-03 NOTE — Telephone Encounter (Signed)
Has this medication been taken care of from 01/14/17? I did not see an update in the Medication tab. Please send to pharmacy listed and update telephone note once completed. Thanks!

## 2017-02-13 ENCOUNTER — Other Ambulatory Visit: Payer: Self-pay | Admitting: Family Medicine

## 2017-02-13 MED ORDER — LAMOTRIGINE 200 MG PO TABS: 200.0000 mg | ORAL_TABLET | Freq: Every day | ORAL | 6 refills | Status: DC

## 2017-02-13 NOTE — Telephone Encounter (Signed)
Patient needed refill of lamictal because [facility] pharmacy closed.  RF sent.

## 2017-03-05 ENCOUNTER — Telehealth: Payer: Self-pay | Admitting: *Deleted

## 2017-03-05 MED ORDER — BUSPIRONE HCL 5 MG PO TABS: ORAL_TABLET | ORAL | 1 refills | Status: DC

## 2017-03-05 NOTE — Telephone Encounter (Signed)
Fax from [facility] stating that ALL buspirone strengths are on manufacturer backorder. Please advise. Thanks.

## 2017-03-05 NOTE — Telephone Encounter (Signed)
Pt advised and voiced understanding.   

## 2017-03-05 NOTE — Telephone Encounter (Signed)
Spoke to patient. Sent RX into [facility] off of [address] in [place].  Patient aware RX was sent.

## 2017-03-05 NOTE — Telephone Encounter (Signed)
Tell pt to call other pharmacies ([facility], [facility], [facility], [facility], etc) to see if they have any buspirone, b/c it may just be the manufacturers that [facility] uses. Pt to call and let us know if she finds pharmacy with buspirone and we'll send new rx to that pharmacy.-thx

## 2017-03-05 NOTE — Addendum Note (Signed)
Addended by: Eulah Pont on: 03/05/2017 01:30 PM   Modules accepted: Orders

## 2017-03-12 ENCOUNTER — Other Ambulatory Visit: Payer: Self-pay | Admitting: Family Medicine

## 2017-03-13 ENCOUNTER — Other Ambulatory Visit: Payer: Self-pay | Admitting: Internal Medicine

## 2017-03-13 ENCOUNTER — Ambulatory Visit (INDEPENDENT_AMBULATORY_CARE_PROVIDER_SITE_OTHER): Payer: 59 | Admitting: Internal Medicine

## 2017-03-13 ENCOUNTER — Encounter: Payer: Self-pay | Admitting: Internal Medicine

## 2017-03-13 ENCOUNTER — Other Ambulatory Visit: Payer: Self-pay | Admitting: Family Medicine

## 2017-03-13 VITALS — BP 110/62 | HR 64 | Wt 241.0 lb

## 2017-03-13 DIAGNOSIS — E042 Nontoxic multinodular goiter: Secondary | ICD-10-CM

## 2017-03-13 DIAGNOSIS — Z794 Long term (current) use of insulin: Secondary | ICD-10-CM | POA: Diagnosis not present

## 2017-03-13 DIAGNOSIS — Z23 Encounter for immunization: Secondary | ICD-10-CM

## 2017-03-13 DIAGNOSIS — E1165 Type 2 diabetes mellitus with hyperglycemia: Secondary | ICD-10-CM

## 2017-03-13 DIAGNOSIS — IMO0001 Reserved for inherently not codable concepts without codable children: Secondary | ICD-10-CM

## 2017-03-13 LAB — POCT GLYCOSYLATED HEMOGLOBIN (HGB A1C): HEMOGLOBIN A1C: 11.1

## 2017-03-13 MED ORDER — BUTALBITAL-APAP-CAFFEINE 50-325-40 MG PO TABS: ORAL_TABLET | ORAL | 1 refills | Status: DC

## 2017-03-13 MED ORDER — LORAZEPAM 0.5 MG PO TABS: 0.5000 mg | ORAL_TABLET | Freq: Two times a day (BID) | ORAL | 5 refills | Status: DC

## 2017-03-13 NOTE — Telephone Encounter (Signed)
RF request for Lorazapam LOV: 01/30/17 Next ov: 05/01/17 Last written: 10/02/16 #60 w/ 5RF  RF request for Butalbital-acetaminophen w/ caffeine Last written: 08/12/15 #60 w/ 1RF  Please advise. Thanks.

## 2017-03-13 NOTE — Patient Instructions (Addendum)
Please continue: - Invokana 100 mg in am - Metformin 1000 mg 2x a day with meals. - Glipizide XL 10 mg in am.  Please increase: - Lantus 45 units at bedtime  Please schedule an eye exam.  Please return in 1.5 months with your sugar log.

## 2017-03-13 NOTE — Telephone Encounter (Signed)
Patient is requesting Rx's for Lorazapam & Butalbital-acetaminophen with caffeine sent to [facility]

## 2017-03-13 NOTE — Progress Notes (Signed)
Patient ID: Kristi Guerrero, female   DOB: 09-30-1960, 56 y.o.   MRN: [id number]    HPI  Kristi Guerrero is a 56 y.o.-year-old female, returning for f/u for MNG and insulin-dependent DM2, dx'ed in the 1990s- had GDM x2, uncontrolled, insulin-dependent. Last visit  7 mo ago.  Since last visit, sugars higher as she did not have insurance >> of Invokana and Lantus for 1.5 mo this summer. Now back on all medicines for 2 mo.  DM2:  Last HbA1c:  Lab Results  Component Value Date   HGBA1C 10.2 07/22/2016   HGBA1C 12.1 04/08/2016   HGBA1C 6.7 06/13/2015   She was on: - Glipizide XL 15 mg in am - Actos 45 mg in am - Metformin 1000 mg 2x a day Tried Januvia >> expensive. Tried Byetta >> did not work well.  Tried Victoza >> nausea.  Now on: - Invokana 100 mg in am >> Steglatro  >> now back on Invokana  - Metformin 1000 mg 2x a day with meals. - Glipizide XL 10 mg in am. - Lantus 24 units at bedtime >> 30 units  Checks sugars 1-2x a day: - am: 120-180 >> 180-240 >> mid 200s - 2h after b'fast: n/c - lunch: 120s >> n/c - 2h after lunch: n/c - dinner: 112-130s >> mid200s >> n/c - 2h after dinner: n/c >> upper 200s >> 300s - bedtime: 180s >> n/c Unclear if she has Hypoglycemia awareness.  No CKD. Last BUN/Cr: Lab Results  Component Value Date   BUN 19 01/30/2017   Lab Results  Component Value Date   CREATININE 0.45 01/30/2017  On Lisinopril.  She has HL. Last Lipid panel: Lab Results  Component Value Date   CHOL 280 (H) 01/30/2017   HDL 35.50 (L) 01/30/2017   LDLCALC 49 04/18/2014   LDLDIRECT 194.0 01/30/2017   TRIG (H) 01/30/2017    456.0 Triglyceride is over 400; calculations on Lipids are invalid.   CHOLHDL 8 01/30/2017  On Lipitor.  Last eye exam: 2015 >> No DR. Did not have a repeat yet.  No numbness and tingling in her feet. Has a herniated disk >> numbness in toes since ~2009.   MNG: Pt was dx with thyroid nodules in 2006 >> saw endocrinology [facility].  Thyroid  U/S (04/20/2014) - 4 nodules, 2 enlarged from previous U/S in 2006:  Right thyroid lobe: 5.5 x 2.6 x 3.3 cm. Complex interpolar region nodule measures 3.0 x 1.9 x 2.8 cm. It is predominately solid with asmall cystic area. On the previous report, this was described to be 1.5 x 1.1 x 1.7 cm.   Left thyroid lobe: 5.4 x 2.8 x 2.5 cm. 2.8 x 2.2 x 2.3 cm interpolar region solid heterogeneous nodule. This was previously described to measure 1.7 x 0.9 x 1.8 cm. Smaller lower pole nodule measures 1.5 x 1.4 x 1.3 cm there are some peripheral calcifications.   Isthmus Thickness: 4 mm. Solid 2.7 x 1.6 x 2.3 cm heterogeneous nodule. On the previous report, measurements of this nodule were 2.7 x 2.4 x 1.7 cm.   Lymphadenopathy: None visualized.  We Bx'ed the 4 nodules on 07/01/2014: - 2 x FLUS - 2 x benign  Repeated Bx's of the FLUS nodules on 07/26/2015: - R nodule: benign - isthmic nodule: FLUS/AUS  Afirma test for isthmic nodule on 07/26/2015: Benign  I reviewed pt's latest thyroid test: Lab Results  Component Value Date   TSH 0.75 01/30/2017    Pt denies: - feeling nodules  in neck - hoarseness - dysphagia - choking - SOB with lying down  Pt does have a + FH of follicular thyroid cancer in mother.   ROS: Constitutional: + weight loss, + fatigue, no subjective hyperthermia, no subjective hypothermia Eyes: no blurry vision, no xerophthalmia ENT: no sore throat, + see HPI Cardiovascular: no CP/no SOB/no palpitations/no leg swelling Respiratory: + cough/no SOB/no wheezing Gastrointestinal: no N/no V/+ D/no C/no acid reflux Musculoskeletal: no muscle aches/no joint aches Skin: no rashes, no hair loss Neurological: no tremors/no numbness/no tingling/no dizziness, + HA  I reviewed pt's medications, allergies, PMH, social hx, family hx, and changes were documented in the history of present illness. Otherwise, unchanged from my initial visit note. She started Ditropan and stopped Iron  since last visit.  Past Medical History:  Diagnosis Date  . Abnormal nuclear stress test 12/31/11   Low risk lexiscan, but left heart cath was recommended and this showed normal coronary arteries and normal LV function. (01/01/12)  . Anxiety    + ?mood disorder (awaiting old psych records as of 12/19/11.  . Borderline personality disorder   . Carpal tunnel syndrome on both sides   . Chronic pain syndrome    low back pain  . Colon cancer screening    Pt did not return cologuard 10/2016.  She has chosen not to get colonoscopy as of 2018 f/u.  Marland Kitchen Daytime somnolence    Nuvigil rx'd by her psychiatrist.  Pt reports that no sleep study has been done.  . DDD (degenerative disc disease), lumbar    Pt says she has hx of herniated disc that has resulted in some numbness in a few toes on left foot.  . 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  I have yet to see/hear any evidence of borderline personality d/o in her since she has been my pt.  . DM2 (diabetes mellitus, type 2) ([place])    Managed by ENDO--Dr. Cruzita Lederer.  +hx of GDM both pregnancies, dx'd with DM 2 in her 26V--ZCHYIF hx of complications  with eyes, kidneys, nerves, or CV system.  . Hepatic steatosis   . History of vitamin D deficiency 2009  . Hyperlipidemia   . Hypertension   . Iron deficiency anemia 2013   Hx of: Likely occult upper GI bleeding.  Hb 8.6--came up to 12 at most recent check after getting on integra, which she now continues once daily.   iFOB was negative 01/13/12.  She says she had been taking excessive NSAIDs at the time.  She has never had a colonoscopy.  . Low back pain 12/19/2011  . Migraine syndrome   . Mixed urge and stress incontinence   . Multinodular goiter    Hx of multiple benign biopsies; repeated u/s's showed no change until the one on  04/2014.  FNA 06/2013 showed R and isthmus nodules intermediate so f/u bx 6 mo recommended.  Repeat bx 07/26/15 showed R lobe benign, isthmus atypical cells of undetermined signif,--detailed path analysis was done and it was BENIGN.  Plan per endo is repeat u/s 1-2 yrs from date of most recent biopsy.  . Obesity   . Renal cyst, acquired, right 2017   Past Surgical History:  Procedure Laterality Date  . CARDIAC CATHETERIZATION  01/01/12   Normal coronaries and normal LV function.  Marland Kitchen  CESAREAN SECTION     X 2  . FNA bx Thyroid nodules  06/29/14   x 4: some findings to support benign cyst and some "cellular atypica of indeterminate significance".--followed by Dr. Cruzita Lederer.  Repeat bx 05/2015 BENIGN.   History   Social History Main Topics  . Smoking status: Never Smoker   . Smokeless tobacco: Never Used  . Alcohol Use: No  . Drug Use: No   Social History Narrative   Divorced, 2 grown children (one in [place] and one in [place]).  One grandchild.   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   No T/A/Ds.   Exercise: occ goes to the [facility].   Currently living in [place], [place].   Current Outpatient Prescriptions on File Prior to Visit  Medication Sig Dispense Refill  . aspirin 81 MG tablet Take 81 mg by mouth daily.    Marland Kitchen atorvastatin (LIPITOR) 80 MG tablet Take 0.5 tablets (40 mg total) by mouth daily. 90 tablet 0  . Blood Glucose Monitoring Suppl (ACCU-CHEK AVIVA CONNECT) W/DEVICE KIT 1 kit by Does not apply route 2 (two) times daily. 1 kit 0  . Blood Glucose Monitoring Suppl (BAYER CONTOUR MONITOR) w/Device KIT Use to check sugar 2 times daily. 1 each 0  . busPIRone (BUSPAR) 5 MG tablet take 1/2 tablet three times a day 90 tablet 1  . butalbital-acetaminophen-caffeine (FIORICET, ESGIC) 50-325-40 MG tablet take 1 to 2 tablets by mouth twice a day if needed for headache 60 tablet 1  . butalbital-aspirin-caffeine (FIORINAL) 50-325-40 MG capsule take 1 to 2 capsules by mouth twice  a day if needed for headache 60 capsule 1  . canagliflozin (INVOKANA) 100 MG TABS tablet take 1 tablet by mouth daily before the first meal of the day 30 tablet 1  . DULoxetine (CYMBALTA) 60 MG capsule TAKE ONE CAPSULE BY MOUTH ONCE EVERY DAY 30 capsule 3  . ferrous sulfate 325 (65 FE) MG tablet Take 325 mg by mouth daily with breakfast.    . glipiZIDE (GLUCOTROL XL) 5 MG 24 hr tablet take 2 tablets by mouth once daily WITH BREAKFAST 60 tablet 6  . glucose blood (BAYER CONTOUR TEST) test strip Use as instructed to check sugar 2 times daily. 200 each 3  . hydrochlorothiazide (HYDRODIURIL) 25 MG tablet TAKE 1 TABLET BY MOUTH EVERY DAY 30 tablet 5  . HYDROcodone-acetaminophen (NORCO/VICODIN) 5-325 MG tablet TAKE 1 TO 2 TABLETS EVERY 6 HOURS AS NEEDED FOR PAIN 120 tablet 0  . Insulin Glargine (LANTUS SOLOSTAR) 100 UNIT/ML Solostar Pen Inject 30 Units into the skin daily at 10 pm. 15 mL 3  . Insulin Pen Needle 31G X 4 MM MISC 1 each by Does not apply route at bedtime. 100 each 5  . lamoTRIgine (LAMICTAL) 200 MG tablet Take 1 tablet (200 mg total) by mouth daily. 30 tablet 6  . Lancet Devices (BAYER MICROLET 2 LANCING DEVIC) MISC Use to check sugar two times daily. 1 each 0  . lisinopril (PRINIVIL,ZESTRIL) 40 MG tablet Take 1 tablet (40 mg total) by mouth daily. 90 tablet 1  . LORazepam (ATIVAN) 0.5 MG tablet take 1 tablet by mouth twice a day 60 tablet 5  . metFORMIN (GLUCOPHAGE) 1000 MG tablet take 1 tablet by mouth twice a day for diabetes 180 tablet 1  . metoprolol tartrate (LOPRESSOR) 25 MG tablet take 1 tablet by mouth twice a day 180 tablet 3  . Multiple Vitamin (MULTIVITAMIN) tablet Take 1 tablet by  mouth daily.    . Multiple Vitamins-Minerals (PRESERVISION AREDS) CAPS Take 1 capsule by mouth daily.    . Omega-3 Fatty Acids (FISH OIL) 1000 MG CAPS Take 1 capsule by mouth at bedtime.    Marland Kitchen omeprazole (PRILOSEC) 40 MG capsule take 1 capsule by mouth once daily 30 capsule 11  . oxybutynin  (DITROPAN-XL) 10 MG 24 hr tablet 1 tab po qd 90 tablet 3  . PROCTOSOL HC 2.5 % rectal cream apply rectally twice a day 30 g 3  . promethazine (PHENERGAN) 12.5 MG tablet 1-2 tabs po q6h prn nausea 30 tablet 1   No current facility-administered medications on file prior to visit.    Allergies  Allergen Reactions  . Levaquin [Levofloxacin In D5w] Hives  . Penicillins     Blistery rash  . Victoza [Liraglutide] Nausea Only   Family History  Problem Relation Age of Onset  . Cancer Mother        breast and follicular thyroid cancer  . Depression Mother   . Breast cancer Mother   . Diabetes Father   . Alcohol abuse Sister   . Hypertension Maternal Grandmother   . Hyperlipidemia Maternal Grandmother   . Heart disease Maternal Grandmother   . Stroke Maternal Grandmother   . Cancer Maternal Grandfather        colon cancer  . Stroke Maternal Grandfather   . Hypertension Maternal Grandfather   . Hyperlipidemia Maternal Grandfather   . Heart disease Maternal Grandfather    PE: BP 110/62 (BP Location: Left Arm, Patient Position: Sitting)   Pulse 64   Wt 241 lb (109.3 kg)   LMP 06/04/2013   SpO2 96%   BMI 39.49 kg/m  Body mass index is 39.49 kg/m. Wt Readings from Last 3 Encounters:  03/13/17 241 lb (109.3 kg)  01/30/17 242 lb 4 oz (109.9 kg)  09/15/16 248 lb 4 oz (112.6 kg)   Constitutional: obese, in NAD Eyes: PERRLA, EOMI, no exophthalmos ENT: moist mucous membranes, no thyromegaly, + thyromegaly - R thyroid fullness and palpable isthmic thyroid nodule, no cervical lymphadenopathy Cardiovascular: RRR, No MRG Respiratory: CTA B Gastrointestinal: abdomen soft, NT, ND, BS+ Musculoskeletal: no deformities, strength intact in all 4 Skin: moist, warm, no rashes Neurological: no tremor with outstretched hands, DTR normal in all 4  ASSESSMENT: 1. DM2, insulin-dependent, uncontrolled, without long term complications, but with hyperglycemia  2. MNG - thyroid U/S  (04/20/2014):   Adequacy Reason Satisfactory For Evaluation. Diagnosis THYROID, FINE NEEDLE ASPIRATION ISTHMUS, (SPECIMEN 1 OF 4, COLLECTED ON 06/29/2014) ATYPIA OF UNDETERMINED SIGNIFICANCE OR FOLLICULAR LESION OF UNDETERMINED SIGNIFICANCE (BETHESDA CATEGORY III). SEE COMMENT. COMMENT: THE SPECIMEN CONSISTS OF SMALL AND MEDIUM SIZED GROUPS OF FOLLICULAR EPITHELIAL CELLS AND SOME BACKGROUND COLLOID. SOME OF THE GROUPS OF CELLS ARE ARRANGED AS MICROFOLLICLES. THERE ARE SCATTERED INTRANUCLEAR GROOVES. BASED ON THESE FEATURES, A FOLLICULAR LESION/NEOPLASM CAN NOT BE ENTIRELY RULED OUT. Enid Cutter MD Pathologist, Electronic Signature (Case signed 06/30/2014) Specimen Clinical Information Solid 2.7 x 1.6 x 2.3cm heterogeneous nodule Source Thyroid, Fine Needle Aspiration, Isthmus, (Specimen 1 of 4, collected on 06/29/2014)   Adequacy Reason Satisfactory For Evaluation. Diagnosis THYROID, FINE NEEDLE ASPIRATION (SPECIMEN 2 OF 4 COLLECTED 06-29-2014) ATYPIA OF UNDETERMINED SIGNIFICANCE OR FOLLICULAR LESION OF UNDETERMINED SIGNIFICANCE (BETHESDA CATEGORY III). SEE COMMENT. COMMENT: THE SPECIMEN IS SOMEWHAT HYPOCELLULAR, HINDERING OPTIMAL CYTOLOGIC EVALUATION. HOWEVER, THERE ARE SCATTERED SMALL GROUPS OF FOLLICULAR EPITHELIAL CELLS WITH MILD CYTOLOGIC ATYPIA, INCLUDING CONSPICUOUS INTRANUCLEAR GROOVES. BASED ON THESE FEATURES, A FOLLICULAR LESION/NEOPLASM CAN NOT  BE ENTIRELY RULED OUT. Enid Cutter MD Pathologist, Electronic Signature (Case signed 06/30/2014) Specimen Clinical Information Right interpolar region nodule measures 3.0 x 1.9 x 2.8cm, It is predominantely solid with a small cystic area Source Thyroid, Fine Needle Aspiration, Right, (Specimen 2 of 4, collected on 06/29/2014)   Adequacy Reason Satisfactory For Evaluation. Diagnosis FINE NEEDLE ASPIRATION, THYROID, LLP (SPECIMEN 3 OF 4 COLLECTED ON 06/29/14): CONSISTENT WITH BENIGN FOLLICULAR NODULE (BETHESDA CATEGORY  II). Enid Cutter MD Pathologist, Electronic Signature (Case signed 06/30/2014) Specimen Clinical Information Smaller llp nodule 1.5 x 1.4 x 1.3cm Source Thyroid, Fine Needle Aspiration, LLP, (Specimen 3 of 4, collected on 06/29/2014)   Adequacy Reason Satisfactory But Limited For Evaluation, Scant Cellularity. Diagnosis THYROID, FINE NEEDLE ASPIRATION: LEFT INTERPOLAR (4 OF 4 COLLECTED ON 6/56/8127) SCANT FOLLICULAR EPITHELIUM PRESENT (BETHESDA CATEGORY I). FINDINGS CONSISTENT WITH THE CONTENTS OF A CYST. Enid Cutter MD Pathologist, Electronic Signature (Case signed 06/30/2014) Specimen Clinical Information 2.8 x 2.2 x 2.3cm interpolar region solid heterogeneous nodule Source Thyroid, Fine Needle Aspiration, Left Interpolar, (Specimen 4 of 4, collected on 06/29/2014)  (07/27/2015): Adequacy Reason Satisfactory For Evaluation. Diagnosis THYROID, FINE NEEDLE ASPIRATION RIGHT MID POLE, (SPECIMEN 1 OF 2 COLLECTED 07/26/2015) CONSISTENT WITH BENIGN FOLLICULAR NODULE (BETHESDA CATEGORY II). Enid Cutter MD Pathologist, Electronic Signature (Case signed 07/27/2015) Specimen Clinical Information Previous biospy 06/29/14, Dominant 33 x 25 x 31 mm mostly solid nodule, right mid lobe (previously 30 x 19 x 28) Source Thyroid, Fine Needle Aspiration, RMP, (Specimen 1 of 2, collected on 07/26/2015)  Adequacy Reason Satisfactory For Evaluation. Diagnosis THYROID, FINE NEEDLE ASPIRATION, RIGHT ISTHMUS (SPECIMEN 2 OF 2 COLLECTED 07-26-2015) ATYPIA OF UNDETERMINED SIGNIFICANCE OR FOLLICULAR LESION OF UNDETERMINED SIGNIFICANCE (BETHESDA CATEGORY III). SEE COMMENT. COMMENT: THE SPECIMEN IS MILDLY CELLULAR AND CONSISTS OF SMALL AND MEDIUM SIZED GROUPS OF FOLLICULAR EPITHELIAL CELLS WITH MINIMAL CYTOLOGIC ATYPIA INCLUDING NUCLEAR OVERLAPPING. A SAMPLE WILL BE SENT FOR AFFIRMA TESTING AND THE RESULTS REPORTED SEPARATELY. Enid Cutter MD Pathologist, Electronic Signature (Case signed  07/27/2015) Specimen Clinical Information Dominant 27 x 17 x 23 mm slightly echogenic solid nodule, right of midline (previously 27 x 16 x 23) Source Thyroid, Fine Needle Aspiration, Right Isthmus, (Specimen 2 of 2, collected on 07/26/2015)  The R thyroid nodule is benign.  The isthmic nodule is still a AUS/FLUS, but Afirma result is BENIGN. (MTC negative).   PLAN:  1. DM2 - Patient with long-standing, uncontrolled diabetes, on oral antidiabetic regimen, to which we added basal insulin. However, she did not have insurance and was off invokana and Lantus for >1 mo >> sugars got out of control and they are still high now despite taking the 4 medications for last 2 mo.  She is missing Lantus ~2x a week >> falls asleep >> advised to move it to dinnertime and make sure she takes it daily. - her insurance did not cover Invokana anymore and we gave her samples of Steglatro (we do not have any more of these) >> but now she can continue with Invokana - I advised her to: Patient Instructions  Please continue: - Invokana 100 mg in am - Metformin 1000 mg 2x a day with meals. - Glipizide XL 10 mg in am.  Please increase: - Lantus 45 units at bedtime  Please schedule an eye exam.  Please return in 1.5 months with your sugar log.     - today, HbA1c is 11.1% (higher) - continue checking sugars at different times of the day - check 1-2x a day, rotating checks -  advised for yearly eye exams >> she is due - + flu shot today - Return to clinic in 3 mo with sugar log   2. MNG  - The right and isthmic thyroid nodules were initially undetermined (FLUS/AUS) on first biopsy, then the right nodule second biopsy was benign, while easing nodule was again indeterminate (FLUS/AUS), however, the [facility] molecular marker was benign. In that case, the nodule is most likely benign and we can continue to follow these clinically and repeat another ultrasound in 1-2 years  - no neck compression sxs  Philemon Kingdom, MD PhD [facility] Endocrinology

## 2017-03-16 ENCOUNTER — Telehealth: Payer: Self-pay | Admitting: Internal Medicine

## 2017-03-16 ENCOUNTER — Other Ambulatory Visit: Payer: Self-pay

## 2017-03-16 ENCOUNTER — Other Ambulatory Visit: Payer: Self-pay | Admitting: *Deleted

## 2017-03-16 MED ORDER — LISINOPRIL 40 MG PO TABS: 40.0000 mg | ORAL_TABLET | Freq: Every day | ORAL | 1 refills | Status: DC

## 2017-03-16 MED ORDER — GLIPIZIDE ER 5 MG PO TB24: ORAL_TABLET | ORAL | 6 refills | Status: DC

## 2017-03-16 NOTE — Telephone Encounter (Signed)
MEDICATION: glipiZIDE (GLUCOTROL XL) 5 MG 24 hr tablet  PHARMACY:   [id number] - [place], [place] - [address] [phone] (Phone) [phone] (Fax)     IS THIS A 90 DAY SUPPLY :  yes  IS PATIENT OUT OF MEDICATION: yes  IF NOT; HOW MUCH IS LEFT:   LAST APPOINTMENT DATE: /28/2018  NEXT APPOINTMENT DATE:@11 /16/2018  OTHER COMMENTS:    **Let patient know to contact pharmacy at the end of the day to make sure medication is ready. **  ** Please notify patient to allow 48-72 hours to process**  **Encourage patient to contact the pharmacy for refills or they can request refills through [facility]**

## 2017-03-16 NOTE — Telephone Encounter (Signed)
Rx sent.  Pt advised and voiced understanding.   

## 2017-03-16 NOTE — Telephone Encounter (Signed)
Rx faxed

## 2017-03-16 NOTE — Telephone Encounter (Signed)
Pt LMOM on 03/16/17 at 1:33pm stating that she needs refill for her [facility]ril [facility] [place].

## 2017-03-16 NOTE — Telephone Encounter (Signed)
Submitted

## 2017-03-31 ENCOUNTER — Encounter: Payer: Self-pay | Admitting: Family Medicine

## 2017-04-02 ENCOUNTER — Other Ambulatory Visit: Payer: Self-pay | Admitting: *Deleted

## 2017-04-02 ENCOUNTER — Other Ambulatory Visit: Payer: Self-pay

## 2017-04-02 ENCOUNTER — Telehealth: Payer: Self-pay | Admitting: Internal Medicine

## 2017-04-02 MED ORDER — INSULIN PEN NEEDLE 31G X 4 MM MISC: 1.0000 | Freq: Every day | 5 refills | Status: DC

## 2017-04-02 MED ORDER — ATORVASTATIN CALCIUM 80 MG PO TABS: 80.0000 mg | ORAL_TABLET | Freq: Every day | ORAL | 1 refills | Status: DC

## 2017-04-02 MED ORDER — GLUCOSE BLOOD VI STRP: ORAL_STRIP | 3 refills | Status: DC

## 2017-04-02 MED ORDER — BAYER MICROLET 2 LANCING DEVIC MISC: 0 refills | Status: DC

## 2017-04-02 NOTE — Telephone Encounter (Signed)
Done

## 2017-04-02 NOTE — Telephone Encounter (Signed)
Pt called stating that her last Rx for atorvastatin said to break tablets in half. She stated that she was told to take a whole 80mg  tablet. I reviewed pts chart and see that pt was advised to take 80mg  and Rx was sent to pharmacy. Rx had the wrong sig. I advised pt that I would resend Rx. Pt voiced understanding.

## 2017-04-02 NOTE — Telephone Encounter (Signed)
Patient needs prescription for the Strips that she uses for the Contour Glucometer. She also needs Lancet Needles. She also needs the needles for the Lantus Insulin Pen. [facility] [address] [facility] [place]

## 2017-04-07 NOTE — Telephone Encounter (Signed)
Misty StanleyLisa from [facility] is calling on the status of the PA for the test strips and Glucometer. Please advise

## 2017-04-08 ENCOUNTER — Other Ambulatory Visit: Payer: Self-pay

## 2017-04-08 ENCOUNTER — Other Ambulatory Visit: Payer: Self-pay | Admitting: Internal Medicine

## 2017-04-08 NOTE — Telephone Encounter (Signed)
Tried to call patient because I don't have a number of [facility]. [facility] had [facility].

## 2017-04-09 IMAGING — US US THYROID BIOPSY
1 series · 13 of 25 positions shown · non-contrast
Comparison: none

INDICATION: 54-year-old female with a history of multiple thyroid nodules.

[Series 1: us thyroid biopsy · 0.08mm/px · 28 acquisitions, 13 frames shown]
[im 1/28]
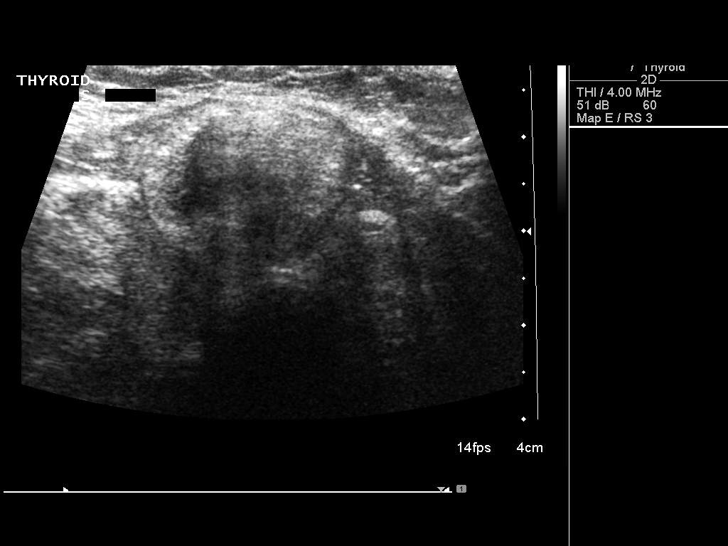
[im 3/28]
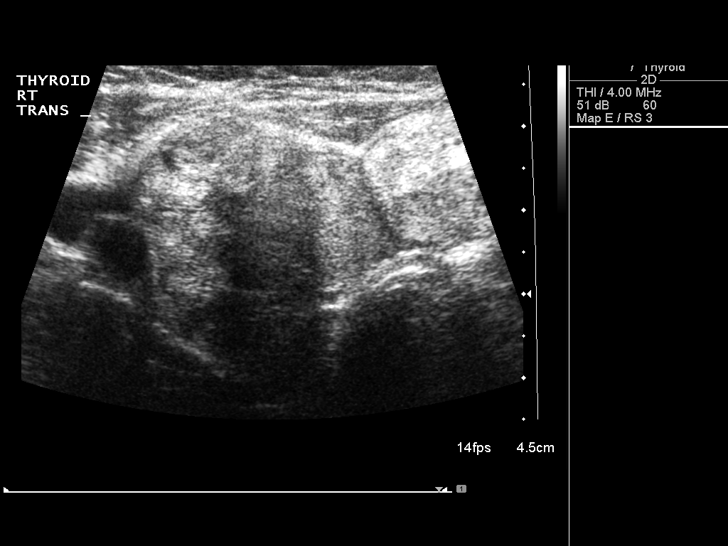
[im 5/28]
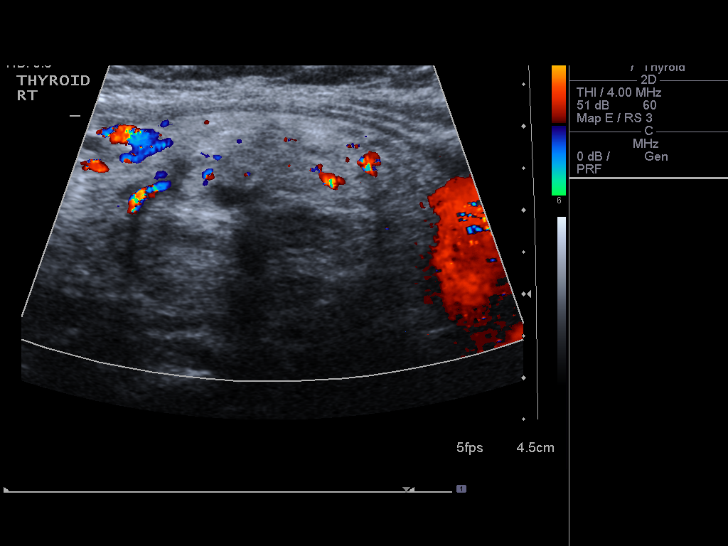
[im 7/28]
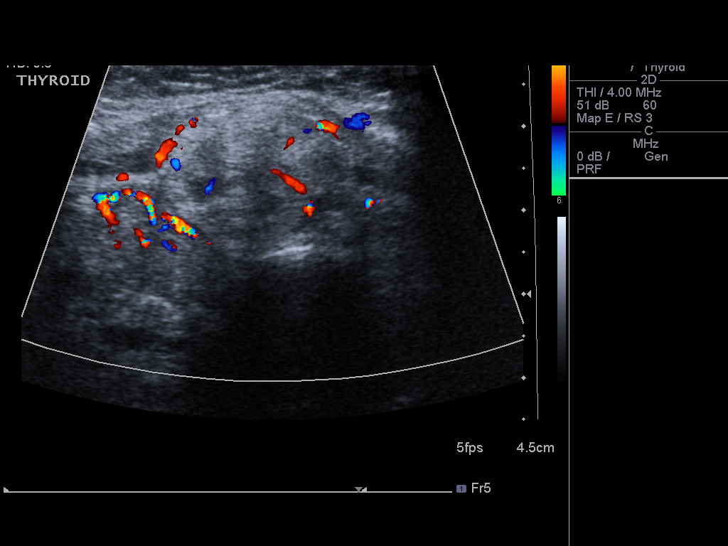
[im 10/28]
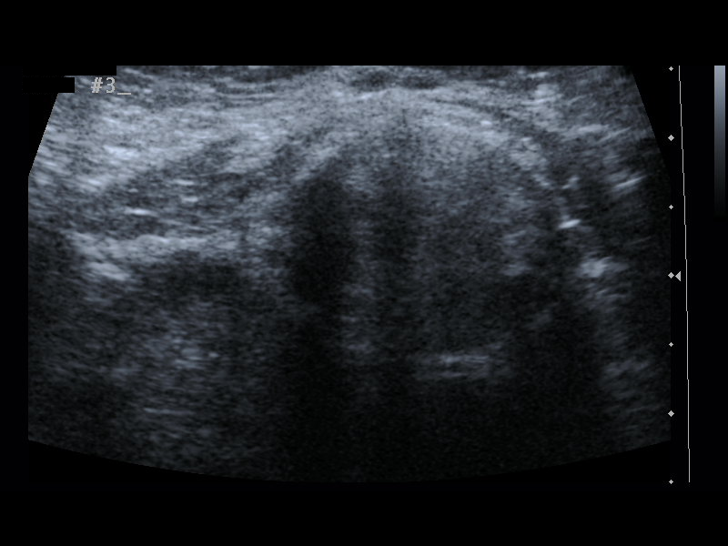
[im 12/28]
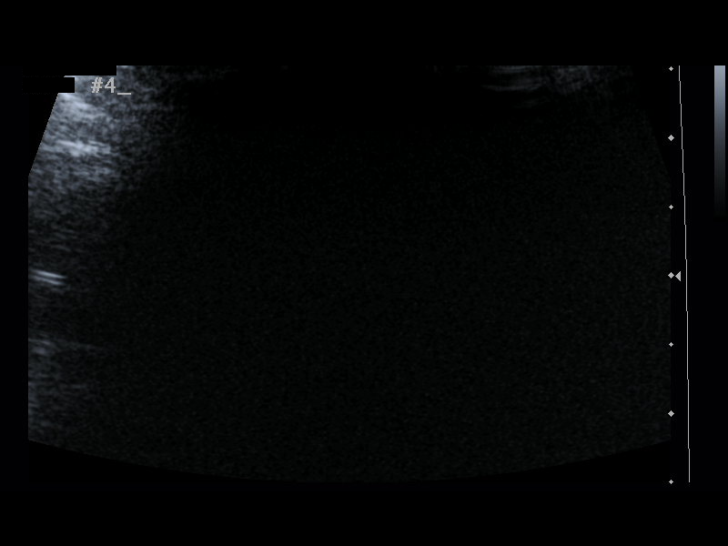
[im 14/28]
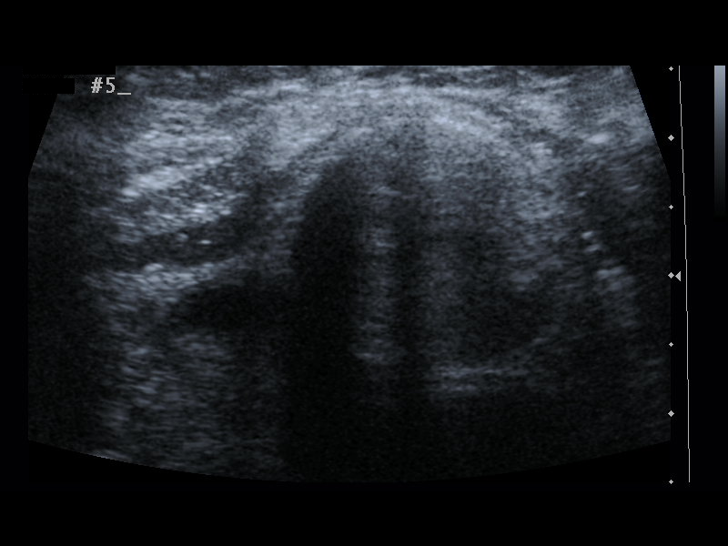
[im 16/28]
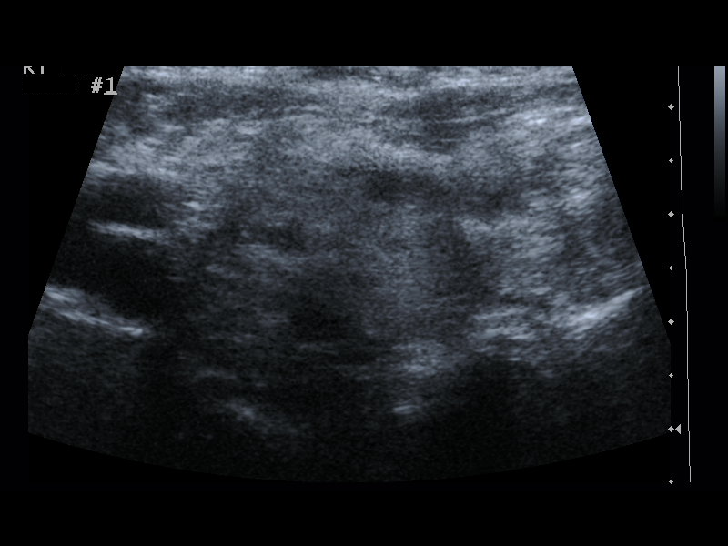
[im 19/28]
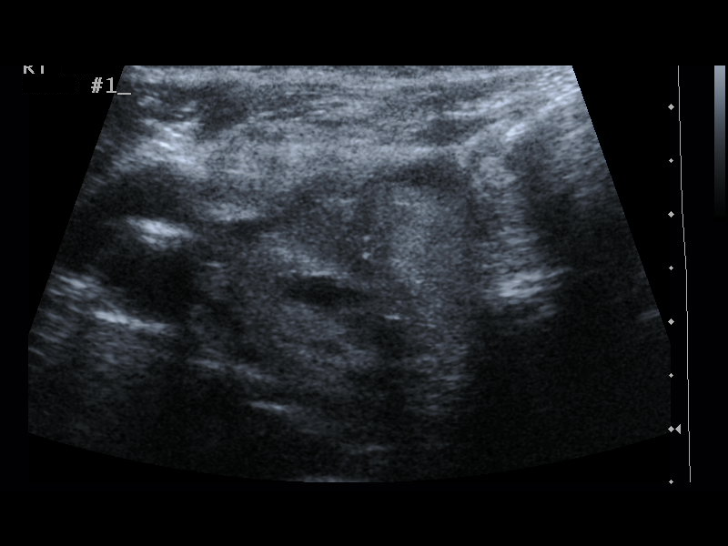
[im 21/28]
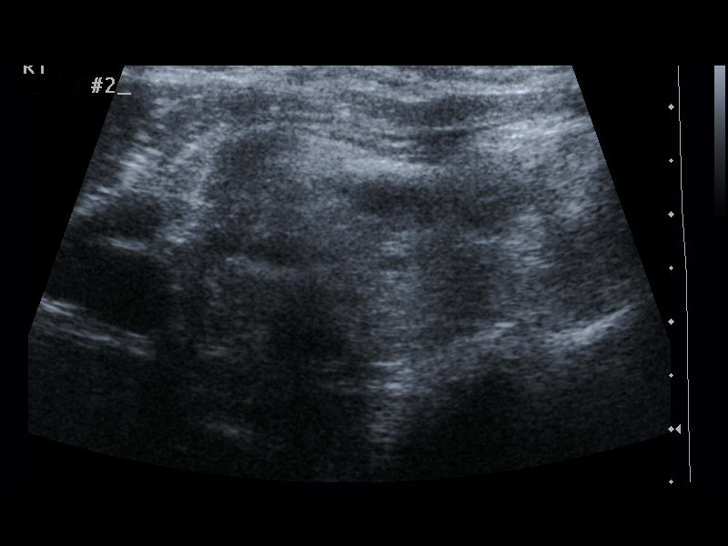
[im 23/28]
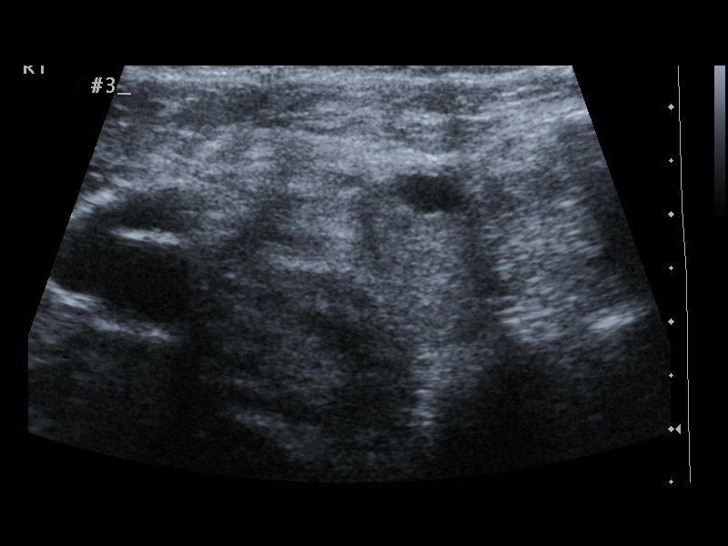
[im 25/28]
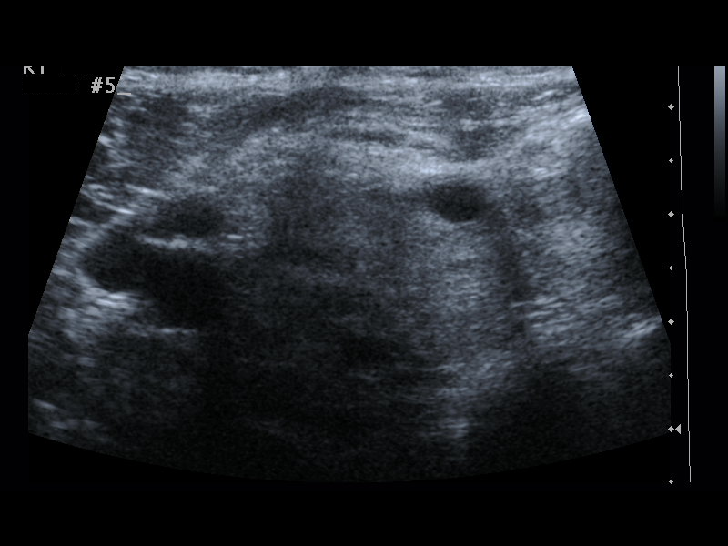
[im 28/28]
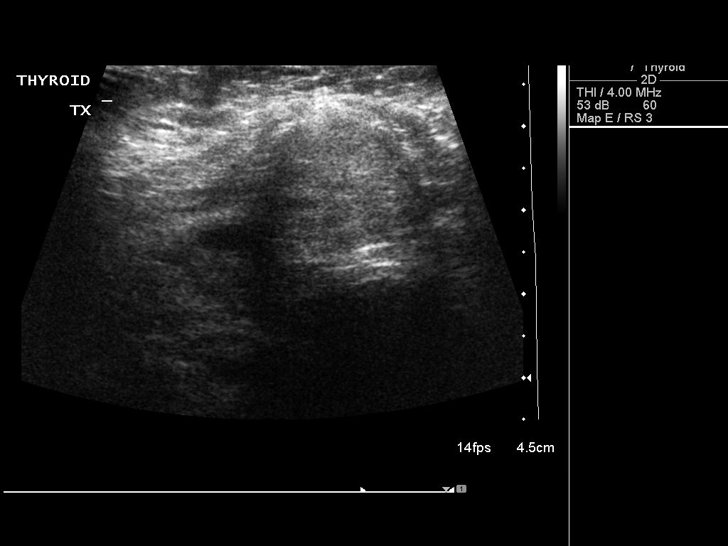

[13 of 25 positions shown; findings below may reference images not displayed]

She has undergone prior ultrasound-guided biopsy of right mid
nodule, isthmus nodule, and 2 left nodules.

Pathology report from these nodule biopsies demonstrate [REDACTED] 3
category of the left-sided nodules. [REDACTED] 2 category of the other
nodules. She has been referred for biopsy with AFIRMA testing.

EXAM:
ULTRASOUND GUIDED NEEDLE ASPIRATE BIOPSY OF THE THYROID GLAND

MEDICATIONS:
None.

ANESTHESIA/SEDATION:
None

FLUOROSCOPY TIME:  None

COMPLICATIONS:
None immediate.

PROCEDURE:
The procedure, risks, benefits, and alternatives were explained to
the patient. Questions regarding the procedure were encouraged and
answered. The patient understands and consents to the procedure.

Ultrasound survey was performed with images stored and sent to PACs.

Isthmic:

The neck was prepped with Betadine in a sterile fashion, and a
sterile drape was applied covering the operative field. A sterile
gown and sterile gloves were used for the procedure. Local
anesthesia was provided with 1% Lidocaine.

Ultrasound guidance was used to infiltrate the region with 1%
lidocaine for local anesthesia. Three separate 25 gauge fine needle
biopsy were then acquired of the isthmic nodule using ultrasound
guidance. Two additional nodules were acquired for AFIRMA testing.
Images were stored.

Right:

Local anesthesia was provided with 1% Lidocaine.

Ultrasound guidance was used to infiltrate the region with 1%
lidocaine for local anesthesia. Three separate 25 gauge fine needle
biopsy were then acquired of the right mid nodule using ultrasound
guidance. Two additional nodules were acquired for AFIRMA testing.
Images were stored.

Final image was stored after biopsy.

Patient tolerated the procedure well and remained hemodynamically
stable throughout.

No complications were encountered and no significant blood loss was
encounter
IMPRESSION: Status post ultrasound-guided biopsy of isthmic nodule and right mid
thyroid nodule. Tissue specimen sent to pathology for complete
histopathologic analysis. Additional nodules were acquired for
AFIRMA testing.

## 2017-04-10 NOTE — Telephone Encounter (Signed)
[facility] [place] [facility] stated patient just picked up test strips on 04/09/17 .   I tried calling patient to see if she needed us to do PA voicemail box was full.

## 2017-04-13 ENCOUNTER — Other Ambulatory Visit: Payer: Self-pay

## 2017-04-13 ENCOUNTER — Other Ambulatory Visit: Payer: Self-pay | Admitting: Family Medicine

## 2017-04-13 MED ORDER — ONETOUCH DELICA LANCING DEV MISC: 5 refills | Status: AC

## 2017-04-13 MED ORDER — CANAGLIFLOZIN 100 MG PO TABS: ORAL_TABLET | ORAL | 1 refills | Status: DC

## 2017-04-13 MED ORDER — ONETOUCH VERIO IQ SYSTEM W/DEVICE KIT: PACK | 0 refills | Status: DC

## 2017-04-13 MED ORDER — BUSPIRONE HCL 5 MG PO TABS: ORAL_TABLET | ORAL | 1 refills | Status: DC

## 2017-04-13 MED ORDER — GLUCOSE BLOOD VI STRP: ORAL_STRIP | 5 refills | Status: AC

## 2017-05-01 ENCOUNTER — Other Ambulatory Visit: Payer: Self-pay

## 2017-05-01 ENCOUNTER — Other Ambulatory Visit: Payer: Self-pay | Admitting: Family Medicine

## 2017-05-01 ENCOUNTER — Ambulatory Visit (INDEPENDENT_AMBULATORY_CARE_PROVIDER_SITE_OTHER): Payer: 59 | Admitting: Family Medicine

## 2017-05-01 ENCOUNTER — Encounter: Payer: Self-pay | Admitting: Family Medicine

## 2017-05-01 VITALS — BP 117/68 | HR 61 | Temp 98.5°F | Resp 16 | Ht 65.5 in | Wt 241.5 lb

## 2017-05-01 DIAGNOSIS — F411 Generalized anxiety disorder: Secondary | ICD-10-CM

## 2017-05-01 DIAGNOSIS — G5603 Carpal tunnel syndrome, bilateral upper limbs: Secondary | ICD-10-CM

## 2017-05-01 DIAGNOSIS — E782 Mixed hyperlipidemia: Secondary | ICD-10-CM | POA: Diagnosis not present

## 2017-05-01 DIAGNOSIS — F329 Major depressive disorder, single episode, unspecified: Secondary | ICD-10-CM

## 2017-05-01 DIAGNOSIS — Z114 Encounter for screening for human immunodeficiency virus [HIV]: Secondary | ICD-10-CM | POA: Diagnosis not present

## 2017-05-01 DIAGNOSIS — M545 Low back pain, unspecified: Secondary | ICD-10-CM

## 2017-05-01 DIAGNOSIS — I10 Essential (primary) hypertension: Secondary | ICD-10-CM

## 2017-05-01 DIAGNOSIS — G894 Chronic pain syndrome: Secondary | ICD-10-CM

## 2017-05-01 DIAGNOSIS — F32A Depression, unspecified: Secondary | ICD-10-CM

## 2017-05-01 DIAGNOSIS — G8929 Other chronic pain: Secondary | ICD-10-CM

## 2017-05-01 LAB — LIPID PANEL
Cholesterol: 163 mg/dL (ref 0–200)
HDL: 38.5 mg/dL — AB (ref >39.00)
LDL CALC: 97 mg/dL (ref 0–99)
NONHDL: 124.53
Total CHOL/HDL Ratio: 4
Triglycerides: 138 mg/dL (ref 0.0–149.0)
VLDL: 27.6 mg/dL (ref 0.0–40.0)

## 2017-05-01 MED ORDER — HYDROCORTISONE 2.5 % RE CREA: TOPICAL_CREAM | RECTAL | 6 refills | Status: DC

## 2017-05-01 NOTE — Progress Notes (Signed)
OFFICE VISIT  05/01/2017   CC:  Chief Complaint  Patient presents with  . Follow-up    RCI, pt is fasting.    HPI:    Patient is a 56 y.o. Caucasian female who presents for 3 mo f/u HTN, hyperlipidemia, chronic anxiety, and chronic pain syndrome. Last visit I increased her atorvastatin to 80 mg qd.  HLD: Taking atorva 80 qd, no side effects. HTN: occ home/work monitoring all normal, no med side effects.  She states her pain is well controlled with use of vicodin---says she does not take this every day.  Indication for chronic opioid: chronic low back pain. Medication and dose: Vicodin 5/325 (usually lasts 2-3 mo). # pills per month: #120 Last UDS date: none Pain contract signed (Y/N): Y, 01/30/17. Date narcotic database last reviewed (include red flags): today, no red flags.  Takes 2 of her 0.10m ativan qhs.  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  Asks for splints to be fitted/dispensed today.  Chronic depression/anxiety: stable on duloxetine 60 mg qd and ativan qhs.   ROS: no CP, no SOB, no fevers, no abd pains, no rash, no myalgias except LB.  Past Medical History:  Diagnosis Date  . Abnormal nuclear stress test 12/31/11   Low risk lexiscan, but left heart cath was recommended and this showed normal coronary arteries and normal LV function. (01/01/12)  . Anxiety    + ?mood disorder (awaiting old psych records as of 12/19/11.  . Borderline personality disorder ([place]   . Carpal tunnel syndrome on both sides   . Chronic pain syndrome    low back pain  . Colon cancer screening    Pt did not return cologuard 10/2016.  She has chosen not to get colonoscopy as of 2018 f/u.  .Marland KitchenDaytime somnolence    Nuvigil rx'd by her psychiatrist.  Pt reports that no sleep study has been done.  . DDD (degenerative disc disease), lumbar    Pt says she has hx of herniated disc that has resulted in some numbness in a few toes on left foot.  . Depression    MDD and borderline PD are her  two main psych dx as per [facility]--these records were handwritten and I could not decipher the script so I got no other helpful info beyond these two dx.  I have yet to see/hear any evidence of borderline personality d/o in her since she has been my pt.  . DM2 (diabetes mellitus, type 2) ([place]    Managed by ENDO--Dr. GCruzita Lederer  +hx of GDM both pregnancies, dx'd with DM 2 in her [id number] of complications  with eyes, kidneys, nerves, or CV system.  . Hepatic steatosis   . History of vitamin D deficiency 2009  . Hyperlipidemia   . Hypertension   . Iron deficiency anemia 2013   Hx of: Likely occult upper GI bleeding.  Hb 8.6--came up to 12 at most recent check after getting on integra, which she now continues once daily.   iFOB was negative 01/13/12.  She says she had been taking excessive NSAIDs at the time.  She has never had a colonoscopy.  . Low back pain 12/19/2011  . Migraine syndrome   . Mixed urge and stress incontinence   . Multinodular goiter    Hx of multiple benign biopsies; repeated u/s's showed no change until the one on 04/2014.  FNA 06/2013 showed R and isthmus nodules intermediate so f/u bx 6 mo recommended.  Repeat bx 07/26/15 showed R lobe benign, isthmus atypical cells of undetermined signif,--detailed path analysis was done and it was BENIGN.  Plan per endo is repeat u/s 1-2 yrs from date of most recent biopsy.-  . Obesity   . Renal cyst, acquired, right 2017    Past Surgical History:  Procedure Laterality Date  . CARDIAC CATHETERIZATION  01/01/12   Normal coronaries and normal LV function.  Marland Kitchen CESAREAN SECTION     X 2  . FNA bx Thyroid nodules  06/29/14   x 4: some findings to support benign cyst and some "cellular atypica of indeterminate significance".--followed by Dr. Cruzita Lederer.  Repeat bx 05/2015 BENIGN.    Outpatient Medications Prior to Visit  Medication Sig Dispense Refill  . aspirin 81 MG tablet Take 81 mg by mouth daily.    Marland Kitchen  atorvastatin (LIPITOR) 80 MG tablet Take 1 tablet (80 mg total) by mouth daily. 90 tablet 1  . busPIRone (BUSPAR) 5 MG tablet take 1/2 tablet three times a day 90 tablet 1  . butalbital-acetaminophen-caffeine (FIORICET, ESGIC) 50-325-40 MG tablet take 1 to 2 tablets by mouth twice a day if needed for headache 60 tablet 1  . canagliflozin (INVOKANA) 100 MG TABS tablet TAKE 1 TABLET BY MOUTH DAILY BEFORE THE FIRST MEAL OF THE DAY 90 tablet 1  . DULoxetine (CYMBALTA) 60 MG capsule TAKE ONE CAPSULE BY MOUTH ONCE EVERY DAY 30 capsule 3  . glipiZIDE (GLUCOTROL XL) 5 MG 24 hr tablet take 2 tablets by mouth once daily WITH BREAKFAST 60 tablet 6  . glucose blood (ONETOUCH VERIO) test strip Use as instructed to check sugar 2 times daily 200 each 5  . hydrochlorothiazide (HYDRODIURIL) 25 MG tablet TAKE 1 TABLET BY MOUTH EVERY DAY 30 tablet 5  . HYDROcodone-acetaminophen (NORCO/VICODIN) 5-325 MG tablet TAKE 1 TO 2 TABLETS EVERY 6 HOURS AS NEEDED FOR PAIN 120 tablet 0  . Insulin Glargine (LANTUS SOLOSTAR) 100 UNIT/ML Solostar Pen Inject 30 Units into the skin daily at 10 pm. 15 mL 3  . Insulin Pen Needle 31G X 4 MM MISC 1 each by Does not apply route at bedtime. 100 each 5  . lamoTRIgine (LAMICTAL) 200 MG tablet Take 1 tablet (200 mg total) by mouth daily. 30 tablet 6  . Lancet Devices (ONE TOUCH DELICA LANCING DEV) MISC Use to check sugar 2 times daily 200 each 5  . lisinopril (PRINIVIL,ZESTRIL) 40 MG tablet Take 1 tablet (40 mg total) by mouth daily. 90 tablet 1  . LORazepam (ATIVAN) 0.5 MG tablet Take 1 tablet (0.5 mg total) by mouth 2 (two) times daily. 60 tablet 5  . metFORMIN (GLUCOPHAGE) 1000 MG tablet take 1 tablet by mouth twice a day for diabetes 180 tablet 1  . metoprolol tartrate (LOPRESSOR) 25 MG tablet take 1 tablet by mouth twice a day 180 tablet 3  . Multiple Vitamin (MULTIVITAMIN) tablet Take 1 tablet by mouth daily.    . Omega-3 Fatty Acids (FISH OIL) 1000 MG CAPS Take 1 capsule by mouth at  bedtime.    Marland Kitchen omeprazole (PRILOSEC) 40 MG capsule take 1 capsule by mouth once daily 30 capsule 11  . oxybutynin (DITROPAN-XL) 10 MG 24 hr tablet 1 tab po qd 90 tablet 3  . Multiple Vitamins-Minerals (PRESERVISION AREDS) CAPS Take 1 capsule by mouth daily.    . Blood Glucose Monitoring Suppl (ONETOUCH VERIO IQ SYSTEM) w/Device KIT Use to check sugar 2 times daily (Patient not taking: Reported on 05/01/2017) 1 kit 0  .  butalbital-aspirin-caffeine [facility]) 50-325-40 MG capsule take 1 to 2 capsules by mouth twice a day if needed for headache (Patient not taking: Reported on 05/01/2017) 60 capsule 1  . ferrous sulfate 325 (65 FE) MG tablet Take 325 mg by mouth daily with breakfast.    . PROCTOSOL HC 2.5 % rectal cream apply rectally twice a day (Patient not taking: Reported on 05/01/2017) 30 g 3  . promethazine (PHENERGAN) 12.5 MG tablet 1-2 tabs po q6h prn nausea (Patient not taking: Reported on 05/01/2017) 30 tablet 1   No facility-administered medications prior to visit.     Allergies  Allergen Reactions  . Levaquin [Levofloxacin In D5w] Hives  . Penicillins     Blistery rash  . Victoza [Liraglutide] Nausea Only    ROS As per HPI  PE: Blood pressure 117/68, pulse 61, temperature 98.5 F (36.9 C), temperature source Oral, resp. rate 16, height 5' 5.5" (1.664 m), weight 241 lb 8 oz (109.5 kg), last menstrual period 06/04/2013, SpO2 98 %. Gen: Alert, well appearing.  Patient is oriented to person, place, time, and situation. AFFECT: pleasant, lucid thought and speech. No further exam today.  LABS:   Lab Results  Component Value Date   WBC 5.9 09/02/2016   HGB 15.0 09/02/2016   HCT 43.4 09/02/2016   MCV 90.8 09/02/2016   PLT 226.0 09/02/2016      Chemistry      Component Value Date/Time   NA 133 (L) 01/30/2017 0925   K 4.1 01/30/2017 0925   CL 97 01/30/2017 0925   CO2 28 01/30/2017 0925   BUN 19 01/30/2017 0925   CREATININE 0.45 01/30/2017 0925   CREATININE 0.67  10/21/2013 1654      Component Value Date/Time   CALCIUM 9.3 01/30/2017 0925   ALKPHOS 90 09/02/2016 0846   AST 12 09/02/2016 0846   ALT 15 09/02/2016 0846   BILITOT 0.5 09/02/2016 0846     Lab Results  Component Value Date   CHOL 280 (H) 01/30/2017   HDL 35.50 (L) 01/30/2017   LDLCALC 49 04/18/2014   LDLDIRECT 194.0 01/30/2017   TRIG (H) 01/30/2017    456.0 Triglyceride is over 400; calculations on Lipids are invalid.   CHOLHDL 8 01/30/2017   Lab Results  Component Value Date   HGBA1C 11.1 03/13/2017   IMPRESSION AND PLAN:  1) Chronic pain syndrome: The current medical regimen is effective;  continue present plan and medications. Urine drug screen today: should have benzo but most likely not vicodin since most recent pill taken was about 5-7 d/a. CSC is UTD. No vicodin rx was needed today.  2) HLD: tolerating 49m atorv.  Recheck FLP today.  3) HTN: The current medical regimen is effective;  continue present plan and medications. Lytes/cr great 3 mo ago.  4) Bilat CTS: fitted/dispensed wrist splint for each wrist today.  She'll wear these during sleep and prn during daytime.  5) Prev health care: HIV screening today.  6) Chronic anxiety and depression: The current medical regimen is effective;  continue present plan and medications. She did not need RF of her ativan today.  An After Visit Summary was printed and given to the patient.  FOLLOW UP: Return in about 3 months (around 08/01/2017) for routine chronic illness f/u.  Signed:  PCrissie Sickles MD           05/01/2017

## 2017-05-06 LAB — PAIN MGMT, PROFILE 8 W/CONF, U
6 Acetylmorphine: NEGATIVE ng/mL (ref <10)
ALCOHOL METABOLITES: NEGATIVE ng/mL (ref <500)
ALPHAHYDROXYALPRAZOLAM: NEGATIVE ng/mL (ref <25)
ALPHAHYDROXYMIDAZOLAM: NEGATIVE ng/mL (ref <50)
AMPHETAMINES: NEGATIVE ng/mL (ref <500)
Alphahydroxytriazolam: NEGATIVE ng/mL (ref <50)
Aminoclonazepam: NEGATIVE ng/mL (ref <25)
BUPRENORPHINE, URINE: NEGATIVE ng/mL (ref <5)
Benzodiazepines: POSITIVE ng/mL — AB (ref <100)
COCAINE METABOLITE: NEGATIVE ng/mL (ref <150)
CREATININE: 83.6 mg/dL
Hydroxyethylflurazepam: NEGATIVE ng/mL (ref <50)
LORAZEPAM: 621 ng/mL — AB (ref <50)
MDMA: NEGATIVE ng/mL (ref <500)
Marijuana Metabolite: NEGATIVE ng/mL (ref <20)
NORDIAZEPAM: NEGATIVE ng/mL (ref <50)
OPIATES: NEGATIVE ng/mL (ref <100)
OXIDANT: NEGATIVE ug/mL (ref <200)
Oxazepam: NEGATIVE ng/mL (ref <50)
Oxycodone: NEGATIVE ng/mL (ref <100)
PH: 6.74 (ref 4.5–9.0)
TEMAZEPAM: NEGATIVE ng/mL (ref <50)

## 2017-05-06 LAB — HIV ANTIBODY (ROUTINE TESTING W REFLEX): HIV: NONREACTIVE

## 2017-05-12 ENCOUNTER — Telehealth: Payer: Self-pay | Admitting: Family Medicine

## 2017-05-12 NOTE — Telephone Encounter (Signed)
Copied from CRM [id number]. Topic: Quick Communication - See Telephone Encounter >> May 12, 2017  4:25 PM Diana EvesHoyt, Maryann B wrote: CRM for notification. See Telephone encounter for:  Pt needing refill on metoprolol  05/12/17.

## 2017-05-13 ENCOUNTER — Other Ambulatory Visit: Payer: Self-pay | Admitting: *Deleted

## 2017-05-13 MED ORDER — METOPROLOL TARTRATE 25 MG PO TABS: 25.0000 mg | ORAL_TABLET | Freq: Two times a day (BID) | ORAL | 3 refills | Status: DC

## 2017-05-13 NOTE — Telephone Encounter (Signed)
Patient in office 05/01/17- Rx refilled

## 2017-05-13 NOTE — Telephone Encounter (Signed)
Refill request for metoprolol.  LOV 05-01-17 with Dr. Milinda CaveMcGowen / Prescription reordered / See telephone encounter from Elby BeckJane Paul, RN on 05/13/2017

## 2017-05-13 NOTE — Telephone Encounter (Signed)
Pt advised and voiced understanding.   

## 2017-06-02 ENCOUNTER — Ambulatory Visit: Payer: 59 | Admitting: Internal Medicine

## 2017-06-26 ENCOUNTER — Telehealth: Payer: Self-pay | Admitting: Internal Medicine

## 2017-06-26 MED ORDER — CANAGLIFLOZIN 100 MG PO TABS: ORAL_TABLET | ORAL | 1 refills | Status: DC

## 2017-06-26 NOTE — Telephone Encounter (Signed)
Pt is aware and will call next week

## 2017-06-26 NOTE — Telephone Encounter (Signed)
Please ask her to check her sugars and let us know in 2-3 days how they are doing.  We may need to increase the insulin for a short period of time, and if she gets Invokana.  There are no covered diabetes medicines that we can do in short-term before she gets the new prescription, unfortunately.

## 2017-06-26 NOTE — Telephone Encounter (Signed)
Pt states that her insurance will cover Invokana only from the mail order pharmacy not at a local. Would like a 2 week RX of another medication sent to her local pharmacy to get her through until the mail order delivers the Invokana. Please advise

## 2017-06-26 NOTE — Telephone Encounter (Signed)
Please advise on below  

## 2017-06-26 NOTE — Telephone Encounter (Signed)
Patient stated that her insurance will not cover her medication, invokana, [facility] phone # [phone]   Patient ask if [name] will give her enough medicine until she get her meds from [facility]. Please advise

## 2017-06-26 NOTE — Telephone Encounter (Signed)
tHEY WILL NOW COVER IT? UNFORTUNATELY, I DO NOT HAVE SAMPLES.Marland Kitchen..Marland Kitchen

## 2017-06-29 ENCOUNTER — Telehealth: Payer: Self-pay

## 2017-06-29 MED ORDER — BUSPIRONE HCL 7.5 MG PO TABS: ORAL_TABLET | ORAL | 1 refills | Status: DC

## 2017-06-29 NOTE — Telephone Encounter (Signed)
Pharmacy has Buspirone 7.5mg .

## 2017-06-29 NOTE — Telephone Encounter (Signed)
Can you see if her pharmacy has the 7.5mg , 10mg , or 15 mg tabs of buspirone?

## 2017-06-29 NOTE — Telephone Encounter (Signed)
She'll now take 1/2 of a 7.5mg  tab twice per day---this still gives her the same total daily dose as she was on before. I eRx'd this.

## 2017-06-29 NOTE — Telephone Encounter (Signed)
Patient notified and expressed understanding.

## 2017-06-29 NOTE — Telephone Encounter (Signed)
Message [facility] from [facility], stating that Buspirone 5 mg is on Backorder. They are asking for an alternative.

## 2017-07-01 ENCOUNTER — Telehealth: Payer: Self-pay | Admitting: Internal Medicine

## 2017-07-01 NOTE — Telephone Encounter (Signed)
[facility] called re: they have been faxing a PA request from insurance for Invokana. They are requesting PA be sent back asap. Ph# [phone] if questions

## 2017-07-01 NOTE — Telephone Encounter (Signed)
No fax has been received yet, pt is aware

## 2017-07-01 NOTE — Telephone Encounter (Signed)
Have not received the fax. Will fax back as soon as fax is received.

## 2017-07-01 NOTE — Telephone Encounter (Signed)
Pt states that the mail order pharmacy was suppose to send over a fax with a paper.  Wanted to check the status of that paper if dr has received it.   Please advise

## 2017-07-01 NOTE — Telephone Encounter (Signed)
Pt is calling back about paperwork.that was being faxed over, to see if we have received it yet.    Please advise Pt would like a call from someone also if we have or have not received the paper yet.   Please call [phone]  If there are any questions.

## 2017-07-02 NOTE — Telephone Encounter (Signed)
Called pt the fax form has not been received yet but I am looking for it. Faxes do not come through immediatly with this being a high fax volume facility and that was explained to pt.

## 2017-07-03 NOTE — Telephone Encounter (Signed)
Received fax, given to Dr. [name]

## 2017-07-03 NOTE — Telephone Encounter (Signed)
[facility] we have not received it yet, they are resending it now.

## 2017-07-03 NOTE — Telephone Encounter (Signed)
LVM for pt that form has been faxed

## 2017-07-08 ENCOUNTER — Other Ambulatory Visit: Payer: Self-pay | Admitting: Family Medicine

## 2017-07-17 ENCOUNTER — Other Ambulatory Visit: Payer: Self-pay

## 2017-07-17 MED ORDER — INSULIN GLARGINE 100 UNIT/ML SOLOSTAR PEN: 30.0000 [IU] | PEN_INJECTOR | Freq: Every day | SUBCUTANEOUS | 3 refills | Status: DC

## 2017-07-31 ENCOUNTER — Encounter: Payer: Self-pay | Admitting: Family Medicine

## 2017-07-31 ENCOUNTER — Ambulatory Visit: Payer: 59 | Admitting: Family Medicine

## 2017-07-31 VITALS — BP 133/87 | HR 79 | Temp 98.3°F | Resp 16 | Ht 65.5 in | Wt 246.0 lb

## 2017-07-31 DIAGNOSIS — K2900 Acute gastritis without bleeding: Secondary | ICD-10-CM | POA: Diagnosis not present

## 2017-07-31 DIAGNOSIS — E119 Type 2 diabetes mellitus without complications: Secondary | ICD-10-CM

## 2017-07-31 DIAGNOSIS — G894 Chronic pain syndrome: Secondary | ICD-10-CM | POA: Diagnosis not present

## 2017-07-31 DIAGNOSIS — M545 Low back pain, unspecified: Secondary | ICD-10-CM

## 2017-07-31 DIAGNOSIS — I10 Essential (primary) hypertension: Secondary | ICD-10-CM

## 2017-07-31 DIAGNOSIS — R600 Localized edema: Secondary | ICD-10-CM

## 2017-07-31 DIAGNOSIS — Z794 Long term (current) use of insulin: Secondary | ICD-10-CM | POA: Diagnosis not present

## 2017-07-31 DIAGNOSIS — E7800 Pure hypercholesterolemia, unspecified: Secondary | ICD-10-CM

## 2017-07-31 DIAGNOSIS — G8929 Other chronic pain: Secondary | ICD-10-CM

## 2017-07-31 DIAGNOSIS — R609 Edema, unspecified: Secondary | ICD-10-CM | POA: Diagnosis not present

## 2017-07-31 DIAGNOSIS — E118 Type 2 diabetes mellitus with unspecified complications: Secondary | ICD-10-CM | POA: Diagnosis not present

## 2017-07-31 MED ORDER — PROMETHAZINE HCL 12.5 MG PO TABS: ORAL_TABLET | ORAL | 3 refills | Status: AC

## 2017-07-31 MED ORDER — HYDROCORTISONE 2.5 % RE CREA: TOPICAL_CREAM | RECTAL | 6 refills | Status: DC

## 2017-07-31 MED ORDER — LORAZEPAM 0.5 MG PO TABS: ORAL_TABLET | ORAL | 5 refills | Status: DC

## 2017-07-31 NOTE — Progress Notes (Signed)
OFFICE VISIT  07/31/2017   CC:  Chief Complaint  Patient presents with  . Follow-up    RCI   HPI:    Patient is a 57 y.o. Caucasian female who presents for 3 mo f/u HTN, chronic pain syndrome, chronic anxiety/depression/dysthymia. She has DM 2 followed by Dr. Elvera Lennox, endocrinologist.  Indication for chronic opioid: chronic low back pain secondary to DDD lumbar spine.  Narcotic pain med rx'd to optimize functioning and quality of life. Medication and dose: Vicodin 5/325 (usually lasts approx 2-3 mo) # pills per month: 120 Last UDS date: 05/01/17--appropriate results. Pain contract signed (Y/N): 01/30/17 Date narcotic database last reviewed (include red flags): today, no red flags. Takes vicodin sparingly, trying to use plain tylenol more.  She does not need   Anx/mood: "about the same": still struggling with anhedonia, feeling down, wanting to sleep a lot, tired.  Taking 2 lorazepam every night, helps anxiety and RLS sx's.  Had been feeling fine, then yesterday she had nausea with 4 vomiting episodes.   No diarrhea.  No abd pain.   She ate liver mush yesterday, which she had not had in a long time.  Had this with grits and eggs. No fevers.  No nausea med taken.  Has not taken her meds yesterday or today. Feeling signif improved today but has not tried to eat much.  No invokana in the last mo b/c of cost issues--trying to work on prior auth's/mail order, etc. Due for f/u with Dr. Elvera Lennox soon.  She will be getting invokana in the mail next week. She has been very stressed with this recently and this could have been contributing to her illness some yesterday.  HTN: no home monitoring.  Compliant with meds except for yesterday and today (due to n/v). Says both LL's have been swelling "more" lately.  ROS: NO CP , no SOB, no PND or orthopnea, no dizziness, no melena. +Frequent HA's as per her usual.  Past Medical History:  Diagnosis Date  . Abnormal nuclear stress test 12/31/11    Low risk lexiscan, but left heart cath was recommended and this showed normal coronary arteries and normal LV function. (01/01/12)  . Anxiety    + ?mood disorder (awaiting old psych records as of 12/19/11.  . Borderline personality disorder ([facility])   . Carpal tunnel syndrome on both sides   . Chronic pain syndrome    low back pain  . Colon cancer screening    Pt did not return cologuard 10/2016.  She has chosen not to get colonoscopy as of 2018 f/u.  Marland Kitchen Daytime somnolence    Nuvigil rx'd by her psychiatrist.  Pt reports that no sleep study has been done.  . DDD (degenerative disc disease), lumbar    Pt says she has hx of herniated disc that has resulted in some numbness in a few toes on left foot.  . 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  I have yet to see/hear any evidence of borderline personality d/o in her since she has been my pt.  . DM2 (diabetes mellitus, type 2) ([facility])    Managed by ENDO--Dr. Elvera Lennox.  +hx of GDM both pregnancies, dx'd with DM 2 in her 30s--denies hx of complications  with eyes, kidneys, nerves, or CV system.  . Hepatic steatosis   .  History of vitamin D deficiency 2009  . Hyperlipidemia   . Hypertension   . Iron deficiency anemia 2013   Hx of: Likely occult upper GI bleeding.  Hb 8.6--came up to 12 at most recent check after getting on integra, which she now continues once daily.   iFOB was negative 01/13/12.  She says she had been taking excessive NSAIDs at the time.  She has never had a colonoscopy.  . Low back pain 12/19/2011  . Migraine syndrome   . Mixed urge and stress incontinence   . Multinodular goiter    Hx of multiple benign biopsies; repeated u/s's showed no change until the one on 04/2014.  FNA 06/2013 showed R and isthmus nodules intermediate so f/u bx 6 mo recommended.   Repeat bx 07/26/15 showed R lobe benign, isthmus atypical cells of undetermined signif,--detailed path analysis was done and it was BENIGN.  Plan per endo is repeat u/s 1-2 yrs from date of most recent biopsy.-  . Obesity   . Renal cyst, acquired, right 2017    Past Surgical History:  Procedure Laterality Date  . CARDIAC CATHETERIZATION  01/01/12   Normal coronaries and normal LV function.  Marland Kitchen CESAREAN SECTION     X 2  . FNA bx Thyroid nodules  06/29/14   x 4: some findings to support benign cyst and some "cellular atypica of indeterminate significance".--followed by Dr. Elvera Lennox.  Repeat bx 05/2015 BENIGN.    Outpatient Medications Prior to Visit  Medication Sig Dispense Refill  . aspirin 81 MG tablet Take 81 mg by mouth daily.    Marland Kitchen atorvastatin (LIPITOR) 80 MG tablet Take 1 tablet (80 mg total) by mouth daily. 90 tablet 1  . busPIRone (BUSPAR) 7.5 MG tablet 1/2 tab po bid 90 tablet 1  . butalbital-acetaminophen-caffeine (FIORICET, ESGIC) 50-325-40 MG tablet take 1 to 2 tablets by mouth twice a day if needed for headache 60 tablet 1  . DULoxetine (CYMBALTA) 60 MG capsule TAKE 1 CAPSULE BY MOUTH EVERY DAY 30 capsule 5  . glipiZIDE (GLUCOTROL XL) 5 MG 24 hr tablet take 2 tablets by mouth once daily WITH BREAKFAST 60 tablet 6  . glucose blood (ONETOUCH VERIO) test strip Use as instructed to check sugar 2 times daily 200 each 5  . hydrochlorothiazide (HYDRODIURIL) 25 MG tablet TAKE 1 TABLET BY MOUTH EVERY DAY 30 tablet 5  . HYDROcodone-acetaminophen (NORCO/VICODIN) 5-325 MG tablet TAKE 1 TO 2 TABLETS EVERY 6 HOURS AS NEEDED FOR PAIN 120 tablet 0  . Insulin Glargine (LANTUS SOLOSTAR) 100 UNIT/ML Solostar Pen Inject 30 Units into the skin daily at 10 pm. (Patient taking differently: Inject 45 Units into the skin daily at 10 pm. ) 15 mL 3  . Insulin Pen Needle 31G X 4 MM MISC 1 each by Does not apply route at bedtime. 100 each 5  . lamoTRIgine (LAMICTAL) 200 MG tablet Take 1 tablet (200 mg total) by  mouth daily. 30 tablet 6  . Lancet Devices (ONE TOUCH DELICA LANCING DEV) MISC Use to check sugar 2 times daily 200 each 5  . lisinopril (PRINIVIL,ZESTRIL) 40 MG tablet Take 1 tablet (40 mg total) by mouth daily. 90 tablet 1  . metFORMIN (GLUCOPHAGE) 1000 MG tablet take 1 tablet by mouth twice a day for diabetes 180 tablet 1  . metoprolol tartrate (LOPRESSOR) 25 MG tablet Take 1 tablet (25 mg total) by mouth 2 (two) times daily. 180 tablet 3  . Multiple Vitamin (MULTIVITAMIN) tablet Take 1 tablet by  mouth daily.    . Multiple Vitamins-Minerals (PRESERVISION AREDS) CAPS Take 1 capsule by mouth daily.    . Omega-3 Fatty Acids (FISH OIL) 1000 MG CAPS Take 1 capsule by mouth at bedtime.    Marland Kitchen. omeprazole (PRILOSEC) 40 MG capsule TAKE ONE CAPSULE BY MOUTH EVERY DAY 30 capsule 5  . oxybutynin (DITROPAN-XL) 10 MG 24 hr tablet 1 tab po qd 90 tablet 3  . hydrocortisone (PROCTOSOL HC) 2.5 % rectal cream apply rectally twice a day 30 g 6  . LORazepam (ATIVAN) 0.5 MG tablet Take 1 tablet (0.5 mg total) by mouth 2 (two) times daily. 60 tablet 5  . canagliflozin (INVOKANA) 100 MG TABS tablet TAKE 1 TABLET BY MOUTH DAILY BEFORE THE FIRST MEAL OF THE DAY (Patient not taking: Reported on 07/31/2017) 90 tablet 1   No facility-administered medications prior to visit.     Allergies  Allergen Reactions  . Levaquin [Levofloxacin In D5w] Hives  . Penicillins     Blistery rash  . Victoza [Liraglutide] Nausea Only    ROS As per HPI  PE: Blood pressure 133/87, pulse 79, temperature 98.3 F (36.8 C), temperature source Oral, resp. rate 16, height 5' 5.5" (1.664 m), weight 246 lb (111.6 kg), last menstrual period 06/04/2013, SpO2 99 %. Gen: Alert, well appearing.  Patient is oriented to person, place, time, and situation. AFFECT: pleasant, lucid thought and speech. EXT: 2+ pitting in pretibial regions bilat.  No rash or tenderness.  LABS:  Lab Results  Component Value Date   TSH 0.75 01/30/2017   Lab  Results  Component Value Date   WBC 5.9 09/02/2016   HGB 15.0 09/02/2016   HCT 43.4 09/02/2016   MCV 90.8 09/02/2016   PLT 226.0 09/02/2016   Lab Results  Component Value Date   CREATININE 0.45 01/30/2017   BUN 19 01/30/2017   NA 133 (L) 01/30/2017   K 4.1 01/30/2017   CL 97 01/30/2017   CO2 28 01/30/2017   Lab Results  Component Value Date   ALT 15 09/02/2016   AST 12 09/02/2016   ALKPHOS 90 09/02/2016   BILITOT 0.5 09/02/2016   Lab Results  Component Value Date   CHOL 163 05/01/2017   Lab Results  Component Value Date   HDL 38.50 (L) 05/01/2017   Lab Results  Component Value Date   LDLCALC 97 05/01/2017   Lab Results  Component Value Date   TRIG 138.0 05/01/2017   Lab Results  Component Value Date   CHOLHDL 4 05/01/2017   Lab Results  Component Value Date   HGBA1C 11.1 03/13/2017    IMPRESSION AND PLAN:  1) HTN: The current medical regimen is effective;  continue present plan and medications. Lytes/cr today.  2) Hypercholesterolemia: tolerating high dose statin.  FLP 04/2017 with LDL 97. Check hepatic panel today. Recheck FLP at her CPE visit in 3 mo.  3) Chronic pain syndrome: doing well from this standpoint, not needed vicodin much at all. No new rx needed today.   CSC and UDS UTD.  4) Mild LE pitting edema: suspect mild chronic venous insufficiency.  Needs to eat low sodium diet, elevate legs qd, rx written for compression stockings today.  5) Acute gastritis: x 1d, improved signif today, question food-related. Phenergan 12.5mg , 1-2 q6h prn, #20, RF x 2. Signs/symptoms to call or return for were reviewed and pt expressed understanding.  6) Chronic anxiety/depression: pretty stable--she is never in more than partial remission. No med changes today.  Rx  for lorazepam faxed to pharmacy today. CSC and UDS UTD.  An After Visit Summary was printed and given to the patient.  FOLLOW UP: Return in about 3 months (around 10/28/2017) for annual CPE  (fasting).  Signed:  Santiago Bumpers, MD           07/31/2017

## 2017-08-01 LAB — COMPREHENSIVE METABOLIC PANEL
AG RATIO: 1.3 (calc) (ref 1.0–2.5)
ALT: 25 U/L (ref 6–29)
AST: 20 U/L (ref 10–35)
Albumin: 3.9 g/dL (ref 3.6–5.1)
Alkaline phosphatase (APISO): 106 U/L (ref 33–130)
BILIRUBIN TOTAL: 0.4 mg/dL (ref 0.2–1.2)
BUN: 13 mg/dL (ref 7–25)
CALCIUM: 9.3 mg/dL (ref 8.6–10.4)
CO2: 29 mmol/L (ref 20–32)
Chloride: 101 mmol/L (ref 98–110)
Creat: 0.67 mg/dL (ref 0.50–1.05)
Globulin: 3 g/dL (calc) (ref 1.9–3.7)
Glucose, Bld: 309 mg/dL — ABNORMAL HIGH (ref 65–99)
Potassium: 3.4 mmol/L — ABNORMAL LOW (ref 3.5–5.3)
SODIUM: 138 mmol/L (ref 135–146)
Total Protein: 6.9 g/dL (ref 6.1–8.1)

## 2017-08-06 ENCOUNTER — Ambulatory Visit: Payer: 59 | Admitting: Internal Medicine

## 2017-08-06 ENCOUNTER — Telehealth: Payer: Self-pay | Admitting: Internal Medicine

## 2017-08-06 ENCOUNTER — Encounter: Payer: Self-pay | Admitting: Internal Medicine

## 2017-08-06 VITALS — BP 130/74 | HR 55 | Temp 98.3°F | Wt 253.2 lb

## 2017-08-06 DIAGNOSIS — E042 Nontoxic multinodular goiter: Secondary | ICD-10-CM

## 2017-08-06 DIAGNOSIS — E1165 Type 2 diabetes mellitus with hyperglycemia: Secondary | ICD-10-CM | POA: Diagnosis not present

## 2017-08-06 DIAGNOSIS — IMO0001 Reserved for inherently not codable concepts without codable children: Secondary | ICD-10-CM

## 2017-08-06 LAB — POCT GLYCOSYLATED HEMOGLOBIN (HGB A1C): HEMOGLOBIN A1C: 9.6

## 2017-08-06 NOTE — Patient Instructions (Addendum)
Please continue: - Invokana 100 mg in am - Metformin 1000 mg 2x a day with meals. - Glipizide XL 10 mg in am.   - Lantus 45 units at bedtime  Please send me your sugars in 2 weeks.  Please call and schedule an eye appt with Dr. Randon GoldsmithLyles: [facility]:  Dr. Jeni SallesGraham W. Lyles MD ?  Address: [address], [place], [place] [address]  [phone]  Please return in 1.5 months with your sugar log.

## 2017-08-06 NOTE — Progress Notes (Signed)
Patient ID: Luella CookRachel Virgen, female   DOB: 11/14/1960, 57 y.o.   MRN: [id number]    HPI  Luella CookRachel Andujo is a 57 y.o.-year-old female, returning for f/u for MNG and insulin-dependent DM2, dx'ed in the 1990s- had GDM x2, uncontrolled, insulin-dependent. Last visit 5 mo ago.  DM2:  Last HbA1c:  Lab Results  Component Value Date   HGBA1C 11.1 03/13/2017   HGBA1C 10.2 07/22/2016   HGBA1C 12.1 04/08/2016   She was on: - Glipizide XL 15 mg in am - Actos 45 mg in am - Metformin 1000 mg 2x a day Tried Januvia >> expensive. Tried Byetta >> did not work well.  Tried Victoza >> nausea.  Now on: - Invokana 100 mg in am >> was off Invokana starting mid-January - for 6 weeks. Started back 2 days ago. - Metformin 1000 mg 2x a day with meals. - Glipizide XL 10 mg in am.   - Lantus 45 units at bedtime  She does not check sugars. From last visit: - am: 120-180 >> 180-240 >> mid 200s  - 2h after b'fast: n/c - lunch: 120s >> n/c - 2h after lunch: n/c - dinner: 112-130s >> mid200s >> n/c - 2h after dinner: n/c >> upper 200s >> 300s - bedtime: 180s >> n/c Unclear at what level she has hypoglycemia awareness.  No CKD. Last BUN/Cr: Lab Results  Component Value Date   BUN 13 07/31/2017   Lab Results  Component Value Date   CREATININE 0.67 07/31/2017  On lisinopril.  + HL. Last Lipid panel: Lab Results  Component Value Date   CHOL 163 05/01/2017   HDL 38.50 (L) 05/01/2017   LDLCALC 97 05/01/2017   LDLDIRECT 194.0 01/30/2017   TRIG 138.0 05/01/2017   CHOLHDL 4 05/01/2017  On Lipitor.  Last eye exam: 2015: No DR   She denies numbness and tingling in her feet. Has a herniated disk >> numbness in toes since ~2009.   MNG: Pt was dx with thyroid nodules in 2006 >> [facility] endocrinology [facility].  Thyroid U/S (04/20/2014) - 4 nodules, 2 enlarged from previous U/S in 2006:  Right thyroid lobe: 5.5 x 2.6 x 3.3 cm. Complex interpolar region nodule measures 3.0 x 1.9 x 2.8 cm. It is  predominately solid with asmall cystic area. On the previous report, this was described to be 1.5 x 1.1 x 1.7 cm.   Left thyroid lobe: 5.4 x 2.8 x 2.5 cm. 2.8 x 2.2 x 2.3 cm interpolar region solid heterogeneous nodule. This was previously described to measure 1.7 x 0.9 x 1.8 cm. Smaller lower pole nodule measures 1.5 x 1.4 x 1.3 cm there are some peripheral calcifications.   Isthmus Thickness: 4 mm. Solid 2.7 x 1.6 x 2.3 cm heterogeneous nodule. On the previous report, measurements of this nodule were 2.7 x 2.4 x 1.7 cm.   Lymphadenopathy: None visualized.  We Bx'ed the 4 nodules on 07/01/2014: - 2 x FLUS - 2 x benign  Repeated Bx's of the FLUS nodules on 07/26/2015: - R nodule: benign - isthmic nodule: FLUS/AUS  Afirma test for isthmic nodule on 07/26/2015: Benign  Latest TSH was normal: Lab Results  Component Value Date   TSH 0.75 01/30/2017    Pt denies: - feeling nodules in neck - hoarseness - dysphagia - choking - SOB with lying down  Pt does have a + FH of follicular thyroid cancer in mother.   ROS: Constitutional: no weight gain/no weight loss, + fatigue, no subjective hyperthermia, no subjective  hypothermia Eyes: no blurry vision, no xerophthalmia ENT: no sore throat, +see HPI Cardiovascular: no CP/no SOB/no palpitations/+ leg swelling Respiratory: no cough/no SOB/no wheezing Gastrointestinal: no N/no V/no D/no C/no acid reflux Musculoskeletal: no muscle aches/no joint aches Skin: no rashes, no hair loss Neurological: no tremors/no numbness/no tingling/no dizziness, + HA  I reviewed pt's medications, allergies, PMH, social hx, family hx, and changes were documented in the history of present illness. Otherwise, unchanged from my initial visit note.  Past Medical History:  Diagnosis Date  . Abnormal nuclear stress test 12/31/11   Low risk lexiscan, but left heart cath was recommended and this showed normal coronary arteries and normal LV function. (01/01/12)    . Anxiety    + ?mood disorder (awaiting old psych records as of 12/19/11.  . Borderline personality disorder (HCC)   . Carpal tunnel syndrome on both sides   . Chronic pain syndrome    low back pain  . Colon cancer screening    Pt did not return cologuard 10/2016.  She has chosen not to get colonoscopy as of 2018 f/u.  Marland Kitchen Daytime somnolence    Nuvigil rx'd by her psychiatrist.  Pt reports that no sleep study has been done.  . DDD (degenerative disc disease), lumbar    Pt says she has hx of herniated disc that has resulted in some numbness in a few toes on left foot.  . 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  I have yet to see/hear any evidence of borderline personality d/o in her since she has been my pt.  . DM2 (diabetes mellitus, type 2) (HCC)    Managed by ENDO--Dr. Elvera Lennox.  +hx of GDM both pregnancies, dx'd with DM 2 in her 30s--denies hx of complications  with eyes, kidneys, nerves, or CV system.  . Hepatic steatosis   . History of vitamin D deficiency 2009  . Hyperlipidemia   . Hypertension   . Iron deficiency anemia 2013   Hx of: Likely occult upper GI bleeding.  Hb 8.6--came up to 12 at most recent check after getting on integra, which she now continues once daily.   iFOB was negative 01/13/12.  She says she had been taking excessive NSAIDs at the time.  She has never had a colonoscopy.  . Low back pain 12/19/2011  . Migraine syndrome   . Mixed urge and stress incontinence   . Multinodular goiter    Hx of multiple benign biopsies; repeated u/s's showed no change until the one on 04/2014.  FNA 06/2013 showed R and isthmus nodules intermediate so f/u bx 6 mo recommended.  Repeat bx 07/26/15 showed R lobe benign, isthmus atypical cells of undetermined signif,--detailed path analysis was done and it was  BENIGN.  Plan per endo is repeat u/s 1-2 yrs from date of most recent biopsy.-  . Obesity   . Renal cyst, acquired, right 2017   Past Surgical History:  Procedure Laterality Date  . CARDIAC CATHETERIZATION  01/01/12   Normal coronaries and normal LV function.  Marland Kitchen CESAREAN SECTION     X 2  . FNA bx Thyroid nodules  06/29/14   x 4: some findings to support benign cyst and some "cellular atypica of indeterminate significance".--followed by Dr. Elvera Lennox.  Repeat bx 05/2015 BENIGN.   History  Social History Main Topics  . Smoking status: Never Smoker   . Smokeless tobacco: Never Used  . Alcohol Use: No  . Drug Use: No   Social History Narrative   Divorced, 2 grown children (one in [place] and one in [place]).  One grandchild.   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   No T/A/Ds.   Exercise: occ goes to the [facility]   Currently living in [place], [place].   Current Outpatient Medications on File Prior to Visit  Medication Sig Dispense Refill  . aspirin 81 MG tablet Take 81 mg by mouth daily.    Marland Kitchen atorvastatin (LIPITOR) 80 MG tablet Take 1 tablet (80 mg total) by mouth daily. 90 tablet 1  . busPIRone (BUSPAR) 7.5 MG tablet 1/2 tab po bid 90 tablet 1  . butalbital-acetaminophen-caffeine (FIORICET, ESGIC) 50-325-40 MG tablet take 1 to 2 tablets by mouth twice a day if needed for headache 60 tablet 1  . canagliflozin (INVOKANA) 100 MG TABS tablet TAKE 1 TABLET BY MOUTH DAILY BEFORE THE FIRST MEAL OF THE DAY 90 tablet 1  . DULoxetine (CYMBALTA) 60 MG capsule TAKE 1 CAPSULE BY MOUTH EVERY DAY 30 capsule 5  . glipiZIDE (GLUCOTROL XL) 5 MG 24 hr tablet take 2 tablets by mouth once daily WITH BREAKFAST 60 tablet 6  . glucose blood (ONETOUCH VERIO) test strip Use as instructed to check sugar 2 times daily 200 each 5  . hydrochlorothiazide (HYDRODIURIL) 25 MG tablet TAKE 1 TABLET BY MOUTH EVERY DAY 30 tablet 5  . HYDROcodone-acetaminophen (NORCO/VICODIN) 5-325 MG tablet TAKE 1 TO  2 TABLETS EVERY 6 HOURS AS NEEDED FOR PAIN 120 tablet 0  . hydrocortisone (PROCTOSOL HC) 2.5 % rectal cream apply rectally twice a day 30 g 6  . Insulin Glargine (LANTUS SOLOSTAR) 100 UNIT/ML Solostar Pen Inject 30 Units into the skin daily at 10 pm. (Patient taking differently: Inject 45 Units into the skin daily at 10 pm. ) 15 mL 3  . Insulin Pen Needle 31G X 4 MM MISC 1 each by Does not apply route at bedtime. 100 each 5  . lamoTRIgine (LAMICTAL) 200 MG tablet Take 1 tablet (200 mg total) by mouth daily. 30 tablet 6  . Lancet Devices (ONE TOUCH DELICA LANCING DEV) MISC Use to check sugar 2 times daily 200 each 5  . lisinopril (PRINIVIL,ZESTRIL) 40 MG tablet Take 1 tablet (40 mg total) by mouth daily. 90 tablet 1  . LORazepam (ATIVAN) 0.5 MG tablet 2 tabs po qhs 60 tablet 5  . metFORMIN (GLUCOPHAGE) 1000 MG tablet take 1 tablet by mouth twice a day for diabetes 180 tablet 1  . metoprolol tartrate (LOPRESSOR) 25 MG tablet Take 1 tablet (25 mg total) by mouth 2 (two) times daily. 180 tablet 3  . Multiple Vitamin (MULTIVITAMIN) tablet Take 1 tablet by mouth daily.    . Multiple Vitamins-Minerals (PRESERVISION AREDS) CAPS Take 1 capsule by mouth daily.    . Omega-3 Fatty Acids (FISH OIL) 1000 MG CAPS Take 1 capsule by mouth at bedtime.    Marland Kitchen omeprazole (PRILOSEC) 40 MG capsule TAKE ONE CAPSULE BY MOUTH EVERY DAY 30 capsule 5  . oxybutynin (DITROPAN-XL) 10 MG 24 hr tablet 1 tab po qd 90 tablet 3  . promethazine (PHENERGAN) 12.5 MG tablet 1-2 tabs po q6h prn nausea/vomiting 20 tablet 3   No current facility-administered medications on file prior to visit.    Allergies  Allergen Reactions  . Levaquin [Levofloxacin  In D5w] Hives  . Penicillins     Blistery rash  . Victoza [Liraglutide] Nausea Only   Family History  Problem Relation Age of Onset  . Cancer Mother        breast and follicular thyroid cancer  . Depression Mother   . Breast cancer Mother   . Diabetes Father   . Alcohol abuse  Sister   . Hypertension Maternal Grandmother   . Hyperlipidemia Maternal Grandmother   . Heart disease Maternal Grandmother   . Stroke Maternal Grandmother   . Cancer Maternal Grandfather        colon cancer  . Stroke Maternal Grandfather   . Hypertension Maternal Grandfather   . Hyperlipidemia Maternal Grandfather   . Heart disease Maternal Grandfather    PE: BP 130/74 (BP Location: Right Arm, Patient Position: Sitting, Cuff Size: Large)   Pulse (!) 55   Temp 98.3 F (36.8 C) (Oral)   Wt 253 lb 4 oz (114.9 kg)   LMP 06/04/2013   SpO2 97%   BMI 41.50 kg/m  Body mass index is 41.5 kg/m. Wt Readings from Last 3 Encounters:  08/06/17 253 lb 4 oz (114.9 kg)  07/31/17 246 lb (111.6 kg)  05/01/17 241 lb 8 oz (109.5 kg)   Constitutional: overweight, in NAD Eyes: PERRLA, EOMI, no exophthalmos ENT: moist mucous membranes, + right thyroid fullness and isthmic nodule palpable, no cervical lymphadenopathy Cardiovascular: RRR, No MRG Respiratory: CTA B Gastrointestinal: abdomen soft, NT, ND, BS+ Musculoskeletal: no deformities, strength intact in all 4 Skin: moist, warm, no rashes Neurological: no tremor with outstretched hands, DTR normal in all 4  2. MNG - thyroid U/S (04/20/2014):   Adequacy Reason Satisfactory For Evaluation. Diagnosis THYROID, FINE NEEDLE ASPIRATION ISTHMUS, (SPECIMEN 1 OF 4, COLLECTED ON 06/29/2014) ATYPIA OF UNDETERMINED SIGNIFICANCE OR FOLLICULAR LESION OF UNDETERMINED SIGNIFICANCE (BETHESDA CATEGORY III). SEE COMMENT. COMMENT: THE SPECIMEN CONSISTS OF SMALL AND MEDIUM SIZED GROUPS OF FOLLICULAR EPITHELIAL CELLS AND SOME BACKGROUND COLLOID. SOME OF THE GROUPS OF CELLS ARE ARRANGED AS MICROFOLLICLES. THERE ARE SCATTERED INTRANUCLEAR GROOVES. BASED ON THESE FEATURES, A FOLLICULAR LESION/NEOPLASM CAN NOT BE ENTIRELY RULED OUT. Pecola Leisure MD Pathologist, Electronic Signature (Case signed 06/30/2014) Specimen Clinical Information Solid 2.7 x 1.6 x  2.3cm heterogeneous nodule Source Thyroid, Fine Needle Aspiration, Isthmus, (Specimen 1 of 4, collected on 06/29/2014)   Adequacy Reason Satisfactory For Evaluation. Diagnosis THYROID, FINE NEEDLE ASPIRATION (SPECIMEN 2 OF 4 COLLECTED 06-29-2014) ATYPIA OF UNDETERMINED SIGNIFICANCE OR FOLLICULAR LESION OF UNDETERMINED SIGNIFICANCE (BETHESDA CATEGORY III). SEE COMMENT. COMMENT: THE SPECIMEN IS SOMEWHAT HYPOCELLULAR, HINDERING OPTIMAL CYTOLOGIC EVALUATION. HOWEVER, THERE ARE SCATTERED SMALL GROUPS OF FOLLICULAR EPITHELIAL CELLS WITH MILD CYTOLOGIC ATYPIA, INCLUDING CONSPICUOUS INTRANUCLEAR GROOVES. BASED ON THESE FEATURES, A FOLLICULAR LESION/NEOPLASM CAN NOT BE ENTIRELY RULED OUT. Pecola Leisure MD Pathologist, Electronic Signature (Case signed 06/30/2014) Specimen Clinical Information Right interpolar region nodule measures 3.0 x 1.9 x 2.8cm, It is predominantely solid with a small cystic area Source Thyroid, Fine Needle Aspiration, Right, (Specimen 2 of 4, collected on 06/29/2014)   Adequacy Reason Satisfactory For Evaluation. Diagnosis FINE NEEDLE ASPIRATION, THYROID, LLP (SPECIMEN 3 OF 4 COLLECTED ON 06/29/14): CONSISTENT WITH BENIGN FOLLICULAR NODULE (BETHESDA CATEGORY II). Pecola Leisure MD Pathologist, Electronic Signature (Case signed 06/30/2014) Specimen Clinical Information Smaller llp nodule 1.5 x 1.4 x 1.3cm Source Thyroid, Fine Needle Aspiration, LLP, (Specimen 3 of 4, collected on 06/29/2014)   Adequacy Reason Satisfactory But Limited For Evaluation, Scant Cellularity. Diagnosis THYROID, FINE NEEDLE ASPIRATION: LEFT INTERPOLAR (4 OF  4 COLLECTED ON 06/29/2014) SCANT FOLLICULAR EPITHELIUM PRESENT (BETHESDA CATEGORY I). FINDINGS CONSISTENT WITH THE CONTENTS OF A CYST. Pecola Leisure MD Pathologist, Electronic Signature (Case signed 06/30/2014) Specimen Clinical Information 2.8 x 2.2 x 2.3cm interpolar region solid heterogeneous nodule Source Thyroid, Fine Needle  Aspiration, Left Interpolar, (Specimen 4 of 4, collected on 06/29/2014)  (07/27/2015): Adequacy Reason Satisfactory For Evaluation. Diagnosis THYROID, FINE NEEDLE ASPIRATION RIGHT MID POLE, (SPECIMEN 1 OF 2 COLLECTED 07/26/2015) CONSISTENT WITH BENIGN FOLLICULAR NODULE (BETHESDA CATEGORY II). Pecola Leisure MD Pathologist, Electronic Signature (Case signed 07/27/2015) Specimen Clinical Information Previous biospy 06/29/14, Dominant 33 x 25 x 31 mm mostly solid nodule, right mid lobe (previously 30 x 19 x 28) Source Thyroid, Fine Needle Aspiration, RMP, (Specimen 1 of 2, collected on 07/26/2015)  Adequacy Reason Satisfactory For Evaluation. Diagnosis THYROID, FINE NEEDLE ASPIRATION, RIGHT ISTHMUS (SPECIMEN 2 OF 2 COLLECTED 07-26-2015) ATYPIA OF UNDETERMINED SIGNIFICANCE OR FOLLICULAR LESION OF UNDETERMINED SIGNIFICANCE (BETHESDA CATEGORY III). SEE COMMENT. COMMENT: THE SPECIMEN IS MILDLY CELLULAR AND CONSISTS OF SMALL AND MEDIUM SIZED GROUPS OF FOLLICULAR EPITHELIAL CELLS WITH MINIMAL CYTOLOGIC ATYPIA INCLUDING NUCLEAR OVERLAPPING. A SAMPLE WILL BE SENT FOR AFFIRMA TESTING AND THE RESULTS REPORTED SEPARATELY. Pecola Leisure MD Pathologist, Electronic Signature (Case signed 07/27/2015) Specimen Clinical Information Dominant 27 x 17 x 23 mm slightly echogenic solid nodule, right of midline (previously 27 x 16 x 23) Source Thyroid, Fine Needle Aspiration, Right Isthmus, (Specimen 2 of 2, collected on 07/26/2015)  The R thyroid nodule is benign.  The isthmic nodule is still a AUS/FLUS, but Afirma result is BENIGN. (MTC negative).   PLAN:  1. DM2 - Patient with long-standing, uncontrolled diabetes, on oral antidiabetic regimen, to which we added basal insulin before last visit.  However, she lost her insurance and was off several of her medicines for more than a month so sugars got out of control, with an HbA1c of 11.1% at last visit.  At this visit, HbA1c was better, at 9.6%. - she was out of  Invokana for 6 weeks >> just restarted 2 days ago. Will continue this. - She is not checking sugars >> advised to start - she needs to send me her sugars in 2 weeks to further adjust her regimen as I cannot adjust the regimen further w/o more data - I advised her to: Patient Instructions  Please continue: - Invokana 100 mg in am - Metformin 1000 mg 2x a day with meals. - Glipizide XL 10 mg in am.   - Lantus 45 units at bedtime  Please send me your sugars in 2 weeks.  Please call and schedule an eye appt with Dr. Randon Goldsmith: [facility]:  Dr. Jeni Salles MD ?  Address: [address] [place], [place], [place] [address]  [phone]  Please return in 1.5 months with your sugar log.    - continue checking sugars at different times of the day - check 2x a day, rotating checks - advised for yearly eye exams >> she is not UTD >> given Dr. Randon Goldsmith' info - Return to clinic in 1.5 mo with sugar log   2. MNG  -The right and isthmic thyroid nodules were initially indeterminate-on first biopsy, than the right nodule while the isthmic nodule was again indeterminate.  However, the [facility] molecular marker was benign.  In this case, w the nodule is most likely benign and we can continue to follow the nodules clinically and repeat another ultrasound in 1-2 years. -Denies neck compression symptoms  Carlus Pavlov,  MD PhD [facility]

## 2017-08-06 NOTE — Telephone Encounter (Signed)
Advised pt we faxed the form on 07/01/17. Pt states that another form was supposed to have been sent, she is aware we have not received this. She is going to have it re faxed to this office and I will call her when I get this form.

## 2017-08-06 NOTE — Telephone Encounter (Signed)
Patient is call on the status of a PA for Invokana Please contact Gwen at [employer] about a Prior Auth. P # [id number] case # [id number]  [facility]

## 2017-08-07 NOTE — Telephone Encounter (Signed)
Form received and faxed today pt is aware

## 2017-08-24 ENCOUNTER — Ambulatory Visit (INDEPENDENT_AMBULATORY_CARE_PROVIDER_SITE_OTHER): Payer: 59

## 2017-08-24 ENCOUNTER — Ambulatory Visit: Payer: 59 | Admitting: Podiatry

## 2017-08-24 ENCOUNTER — Encounter: Payer: Self-pay | Admitting: Podiatry

## 2017-08-24 VITALS — BP 125/70 | HR 98 | Resp 16

## 2017-08-24 DIAGNOSIS — M722 Plantar fascial fibromatosis: Secondary | ICD-10-CM

## 2017-08-24 DIAGNOSIS — E119 Type 2 diabetes mellitus without complications: Secondary | ICD-10-CM | POA: Diagnosis not present

## 2017-08-24 LAB — HM DIABETES FOOT EXAM: HM DIABETIC FOOT EXAM: NORMAL

## 2017-08-24 MED ORDER — MELOXICAM 7.5 MG PO TABS: 7.5000 mg | ORAL_TABLET | Freq: Every day | ORAL | 0 refills | Status: DC

## 2017-08-24 NOTE — Patient Instructions (Addendum)
 Plantar Fasciitis (Heel Spur Syndrome) with Rehab The plantar fascia is a fibrous, ligament-like, soft-tissue structure that spans the bottom of the foot. Plantar fasciitis is a condition that causes pain in the foot due to inflammation of the tissue. SYMPTOMS   Pain and tenderness on the underneath side of the foot.  Pain that worsens with standing or walking. CAUSES  Plantar fasciitis is caused by irritation and injury to the plantar fascia on the underneath side of the foot. Common mechanisms of injury include:  Direct trauma to bottom of the foot.  Damage to a small nerve that runs under the foot where the main fascia attaches to the heel bone.  Stress placed on the plantar fascia due to bone spurs. RISK INCREASES WITH:   Activities that place stress on the plantar fascia (running, jumping, pivoting, or cutting).  Poor strength and flexibility.  Improperly fitted shoes.  Tight calf muscles.  Flat feet.  Failure to warm-up properly before activity.  Obesity. PREVENTION  Warm up and stretch properly before activity.  Allow for adequate recovery between workouts.  Maintain physical fitness:  Strength, flexibility, and endurance.  Cardiovascular fitness.  Maintain a health body weight.  Avoid stress on the plantar fascia.  Wear properly fitted shoes, including arch supports for individuals who have flat feet.  PROGNOSIS  If treated properly, then the symptoms of plantar fasciitis usually resolve without surgery. However, occasionally surgery is necessary.  RELATED COMPLICATIONS   Recurrent symptoms that may result in a chronic condition.  Problems of the lower back that are caused by compensating for the injury, such as limping.  Pain or weakness of the foot during push-off following surgery.  Chronic inflammation, scarring, and partial or complete fascia tear, occurring more often from repeated injections.  TREATMENT  Treatment initially involves  the use of ice and medication to help reduce pain and inflammation. The use of strengthening and stretching exercises may help reduce pain with activity, especially stretches of the Achilles tendon. These exercises may be performed at home or with a therapist. Your caregiver may recommend that you use heel cups of arch supports to help reduce stress on the plantar fascia. Occasionally, corticosteroid injections are given to reduce inflammation. If symptoms persist for greater than 6 months despite non-surgical (conservative), then surgery may be recommended.   MEDICATION   If pain medication is necessary, then nonsteroidal anti-inflammatory medications, such as aspirin and ibuprofen, or other minor pain relievers, such as acetaminophen, are often recommended.  Do not take pain medication within 7 days before surgery.  Prescription pain relievers may be given if deemed necessary by your caregiver. Use only as directed and only as much as you need.  Corticosteroid injections may be given by your caregiver. These injections should be reserved for the most serious cases, because they may only be given a certain number of times.  HEAT AND COLD  Cold treatment (icing) relieves pain and reduces inflammation. Cold treatment should be applied for 10 to 15 minutes every 2 to 3 hours for inflammation and pain and immediately after any activity that aggravates your symptoms. Use ice packs or massage the area with a piece of ice (ice massage).  Heat treatment may be used prior to performing the stretching and strengthening activities prescribed by your caregiver, physical therapist, or athletic trainer. Use a heat pack or soak the injury in warm water.  SEEK IMMEDIATE MEDICAL CARE IF:  Treatment seems to offer no benefit, or the condition worsens.  Any   medications produce adverse side effects.  EXERCISES- RANGE OF MOTION (ROM) AND STRETCHING EXERCISES - Plantar Fasciitis (Heel Spur Syndrome) These  exercises may help you when beginning to rehabilitate your injury. Your symptoms may resolve with or without further involvement from your physician, physical therapist or athletic trainer. While completing these exercises, remember:   Restoring tissue flexibility helps normal motion to return to the joints. This allows healthier, less painful movement and activity.  An effective stretch should be held for at least 30 seconds.  A stretch should never be painful. You should only feel a gentle lengthening or release in the stretched tissue.  RANGE OF MOTION - Toe Extension, Flexion  Sit with your right / left leg crossed over your opposite knee.  Grasp your toes and gently pull them back toward the top of your foot. You should feel a stretch on the bottom of your toes and/or foot.  Hold this stretch for 10 seconds.  Now, gently pull your toes toward the bottom of your foot. You should feel a stretch on the top of your toes and or foot.  Hold this stretch for 10 seconds. Repeat  times. Complete this stretch 3 times per day.   RANGE OF MOTION - Ankle Dorsiflexion, Active Assisted  Remove shoes and sit on a chair that is preferably not on a carpeted surface.  Place right / left foot under knee. Extend your opposite leg for support.  Keeping your heel down, slide your right / left foot back toward the chair until you feel a stretch at your ankle or calf. If you do not feel a stretch, slide your bottom forward to the edge of the chair, while still keeping your heel down.  Hold this stretch for 10 seconds. Repeat 3 times. Complete this stretch 2 times per day.   STRETCH  Gastroc, Standing  Place hands on wall.  Extend right / left leg, keeping the front knee somewhat bent.  Slightly point your toes inward on your back foot.  Keeping your right / left heel on the floor and your knee straight, shift your weight toward the wall, not allowing your back to arch.  You should feel a gentle  stretch in the right / left calf. Hold this position for 10 seconds. Repeat 3 times. Complete this stretch 2 times per day.  STRETCH  Soleus, Standing  Place hands on wall.  Extend right / left leg, keeping the other knee somewhat bent.  Slightly point your toes inward on your back foot.  Keep your right / left heel on the floor, bend your back knee, and slightly shift your weight over the back leg so that you feel a gentle stretch deep in your back calf.  Hold this position for 10 seconds. Repeat 3 times. Complete this stretch 2 times per day.  STRETCH  Gastrocsoleus, Standing  Note: This exercise can place a lot of stress on your foot and ankle. Please complete this exercise only if specifically instructed by your caregiver.   Place the ball of your right / left foot on a step, keeping your other foot firmly on the same step.  Hold on to the wall or a rail for balance.  Slowly lift your other foot, allowing your body weight to press your heel down over the edge of the step.  You should feel a stretch in your right / left calf.  Hold this position for 10 seconds.  Repeat this exercise with a slight bend in your right /   left knee. Repeat 3 times. Complete this stretch 2 times per day.   STRENGTHENING EXERCISES - Plantar Fasciitis (Heel Spur Syndrome)  These exercises may help you when beginning to rehabilitate your injury. They may resolve your symptoms with or without further involvement from your physician, physical therapist or athletic trainer. While completing these exercises, remember:   Muscles can gain both the endurance and the strength needed for everyday activities through controlled exercises.  Complete these exercises as instructed by your physician, physical therapist or athletic trainer. Progress the resistance and repetitions only as guided.  STRENGTH - Towel Curls  Sit in a chair positioned on a non-carpeted surface.  Place your foot on a towel, keeping  your heel on the floor.  Pull the towel toward your heel by only curling your toes. Keep your heel on the floor. Repeat 3 times. Complete this exercise 2 times per day.  STRENGTH - Ankle Inversion  Secure one end of a rubber exercise band/tubing to a fixed object (table, pole). Loop the other end around your foot just before your toes.  Place your fists between your knees. This will focus your strengthening at your ankle.  Slowly, pull your big toe up and in, making sure the band/tubing is positioned to resist the entire motion.  Hold this position for 10 seconds.  Have your muscles resist the band/tubing as it slowly pulls your foot back to the starting position. Repeat 3 times. Complete this exercises 2 times per day.  Document Released: 06/02/2005 Document Revised: 08/25/2011 Document Reviewed: 09/14/2008 Exit[employer] Patient Information 2014 [employer] Diabetes and Foot Care Diabetes may cause you to have problems because of poor blood supply (circulation) to your feet and legs. This may cause the skin on your feet to become thinner, break easier, and heal more slowly. Your skin may become dry, and the skin may peel and crack. You may also have nerve damage in your legs and feet causing decreased feeling in them. You may not notice minor injuries to your feet that could lead to infections or more serious problems. Taking care of your feet is one of the most important things you can do for yourself. Follow these instructions at home:  Wear shoes at all times, even in the house. Do not go barefoot. Bare feet are easily injured.  Check your feet daily for blisters, cuts, and redness. If you cannot see the bottom of your feet, use a mirror or ask someone for help.  Wash your feet with warm water (do not use hot water) and mild soap. Then pat your feet and the areas between your toes until they are completely dry. Do not soak your feet as this can dry your skin.  Apply a moisturizing  lotion or petroleum jelly (that does not contain alcohol and is unscented) to the skin on your feet and to dry, brittle toenails. Do not apply lotion between your toes.  Trim your toenails straight across. Do not dig under them or around the cuticle. File the edges of your nails with an emery board or nail file.  Do not cut corns or calluses or try to remove them with medicine.  Wear clean socks or stockings every day. Make sure they are not too tight. Do not wear knee-high stockings since they may decrease blood flow to your legs.  Wear shoes that fit properly and have enough cushioning. To break in new shoes, wear them for just a few hours a day. This prevents you from injuring   your feet. Always look in your shoes before you put them on to be sure there are no objects inside.  Do not cross your legs. This may decrease the blood flow to your feet.  If you find a minor scrape, cut, or break in the skin on your feet, keep it and the skin around it clean and dry. These areas may be cleansed with mild soap and water. Do not cleanse the area with peroxide, alcohol, or iodine.  When you remove an adhesive bandage, be sure not to damage the skin around it.  If you have a wound, look at it several times a day to make sure it is healing.  Do not use heating pads or hot water bottles. They may burn your skin. If you have lost feeling in your feet or legs, you may not know it is happening until it is too late.  Make sure your health care provider performs a complete foot exam at least annually or more often if you have foot problems. Report any cuts, sores, or bruises to your health care provider immediately. Contact a health care provider if:  You have an injury that is not healing.  You have cuts or breaks in the skin.  You have an ingrown nail.  You notice redness on your legs or feet.  You feel burning or tingling in your legs or feet.  You have pain or cramps in your legs and  feet.  Your legs or feet are numb.  Your feet always feel cold. Get help right away if:  There is increasing redness, swelling, or pain in or around a wound.  There is a red line that goes up your leg.  Pus is coming from a wound.  You develop a fever or as directed by your health care provider.  You notice a bad smell coming from an ulcer or wound. This information is not intended to replace advice given to you by your health care provider. Make sure you discuss any questions you have with your health care provider. Document Released: 05/30/2000 Document Revised: 11/08/2015 Document Reviewed: 11/09/2012 Elsevier Interactive Patient Education  2017 Elsevier Inc[employer]  
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
## 2017-09-01 ENCOUNTER — Encounter: Payer: Self-pay | Admitting: Family Medicine

## 2017-09-10 ENCOUNTER — Other Ambulatory Visit: Payer: Self-pay | Admitting: Family Medicine

## 2017-09-14 ENCOUNTER — Ambulatory Visit (INDEPENDENT_AMBULATORY_CARE_PROVIDER_SITE_OTHER): Payer: 59 | Admitting: Podiatry

## 2017-09-14 ENCOUNTER — Ambulatory Visit (INDEPENDENT_AMBULATORY_CARE_PROVIDER_SITE_OTHER): Payer: 59

## 2017-09-14 ENCOUNTER — Encounter: Payer: Self-pay | Admitting: Podiatry

## 2017-09-14 DIAGNOSIS — L03032 Cellulitis of left toe: Secondary | ICD-10-CM | POA: Diagnosis not present

## 2017-09-14 DIAGNOSIS — S92502A Displaced unspecified fracture of left lesser toe(s), initial encounter for closed fracture: Secondary | ICD-10-CM | POA: Diagnosis not present

## 2017-09-14 DIAGNOSIS — T1490XA Injury, unspecified, initial encounter: Secondary | ICD-10-CM | POA: Diagnosis not present

## 2017-09-14 DIAGNOSIS — M722 Plantar fascial fibromatosis: Secondary | ICD-10-CM | POA: Diagnosis not present

## 2017-09-14 DIAGNOSIS — L02612 Cutaneous abscess of left foot: Secondary | ICD-10-CM | POA: Diagnosis not present

## 2017-09-14 DIAGNOSIS — S92912A Unspecified fracture of left toe(s), initial encounter for closed fracture: Secondary | ICD-10-CM

## 2017-09-14 HISTORY — DX: Unspecified fracture of left toe(s), initial encounter for closed fracture: S92.912A

## 2017-09-14 MED ORDER — CLINDAMYCIN HCL 300 MG PO CAPS: 300.0000 mg | ORAL_CAPSULE | Freq: Three times a day (TID) | ORAL | 0 refills | Status: DC

## 2017-09-15 NOTE — Progress Notes (Signed)
Subjective: 57 year old female presents the office today for follow-up evaluation of plantar fasciitis.  She states that she did well for the last injection.  She still has some occasional discomfort, burning to the heel however this is improving.  She has new concerns today of her left third toe.  She states that she injured it about a week ago but she is not sure how she hit it.  She has noticed that the nail has come off there is been redness and swelling to the toenail site.  Denies any drainage or pus.  Denies any red streaks.  She states her blood sugars have been in the 300s. Denies any systemic complaints such as fevers, chills, nausea, vomiting. No acute changes since last appointment, and no other complaints at this time.   Objective: AAO x3, NAD DP/PT pulses palpable bilaterally, CRT less than 3 seconds There is tenderness palpation of the distal portion of the left third toe there is localized edema and erythema to the distal portion of the toe.  There is a dry scab to the nailbed and there is no nail present.  There is no fluctuation or crepitation.  There is no drainage or purulence.  There is no malodor. Mild tenderness palpation on the plantar medial tubercle of the calcaneus at the insertion of plantar fascia.  No pain over the course the plantar fascia and the arch of the foot.  Achilles tendon appears to be intact.  No other area tenderness.  Negative Tinel sign. No open lesions or pre-ulcerative lesions.  No pain with calf compression, swelling, warmth, erythema  Assessment: Resolving plantar fasciitis however with left third toe fracture, localized erythema  Plan: -All treatment options discussed with the patient including all alternatives, risks, complications.  -X-rays were obtained and reviewed this did reveal a fracture of the distal phalanx of the left third toe.  There is no nail present.  I did debride some of the scab make sure there is no purulence.  I was unable to  identify.  Given the erythema I do recommend antibiotics I prescribed clindamycin for her.  I dispensed a Darco surgical shoe.  Recommend antibiotic ointment and a bandage daily to the right. -She continue with stretching exercises for plantar fasciitis however we will hold off on another steroid injection at this time. -RTC 7-10 days or sooner if needed -Patient encouraged to call the office with any questions, concerns, change in symptoms.   Vivi BarrackMatthew R Marlee Trentman DPM

## 2017-09-16 ENCOUNTER — Encounter: Payer: Self-pay | Admitting: Family Medicine

## 2017-09-16 LAB — COLOGUARD

## 2017-09-17 ENCOUNTER — Encounter: Payer: Self-pay | Admitting: Family Medicine

## 2017-09-23 ENCOUNTER — Other Ambulatory Visit: Payer: Self-pay | Admitting: Family Medicine

## 2017-09-25 ENCOUNTER — Ambulatory Visit: Payer: 59 | Admitting: Podiatry

## 2017-10-01 ENCOUNTER — Telehealth: Payer: Self-pay | Admitting: Podiatry

## 2017-10-01 MED ORDER — FLUCONAZOLE 150 MG PO TABS: 150.0000 mg | ORAL_TABLET | Freq: Once | ORAL | 0 refills | Status: AC

## 2017-10-01 NOTE — Addendum Note (Signed)
Addended by: Alphia Kava'CONNELL, VALERY D on: 10/01/2017 03:04 PM   Modules accepted: Orders

## 2017-10-01 NOTE — Telephone Encounter (Signed)
Dr. Ardelle AntonWagoner ordered diflucan 150mg  #1 Take as directed. I informed pt.

## 2017-10-01 NOTE — Telephone Encounter (Signed)
I saw Dr. Ardelle AntonWagoner a couple of weeks ago and he put me on an antibiotic for an infected toe. I have completed the antibiotic and now I have a yeast infection. I was wondering if he could call me in some diflucan. My number is [phone]. Thank you.

## 2017-10-02 ENCOUNTER — Ambulatory Visit: Payer: 59 | Admitting: Internal Medicine

## 2017-10-05 ENCOUNTER — Telehealth: Payer: Self-pay | Admitting: *Deleted

## 2017-10-05 ENCOUNTER — Ambulatory Visit: Payer: Self-pay

## 2017-10-05 MED ORDER — FLUCONAZOLE 150 MG PO TABS: ORAL_TABLET | ORAL | 1 refills | Status: DC

## 2017-10-05 NOTE — Telephone Encounter (Signed)
I told pt I could only order one Diflucan, and if the yeast infection had worsened she should contact her PCP. Pt states understanding.

## 2017-10-05 NOTE — Telephone Encounter (Signed)
Pt states the yeast infection has worsened and she needs another diflucan.

## 2017-10-05 NOTE — Telephone Encounter (Signed)
Patient called with c/o "yeast infection." She says "I was put on an antibiotic from my podiatrist for an infected toe. Last week I developed a yeast infection, call their office and he ordered Diflucan 1 dose on 10/01/17. That did not help. I still have the severe itching, an 8 on the scale, and irritation to the outside which burns when I urinate. I called the office back and was told to contact my primary care provider for additional medication." I asked about discharge, fever, abdominal pain, she says "discharge is not discolored or odor, no fever and no abdominal pain." She says "I can use the OTC medicine for now, but would like to get 2 doses of Diflucan called in, if possible." According to protocol, home care advice, care advice given, patient verbalized understanding.   Reason for Disposition . [1] Symptoms of a yeast infection (i.e., itchy, white discharge, not bad smelling) AND    [2] feels like prior vaginal yeast infections  Answer Assessment - Initial Assessment Questions 1. SYMPTOM: "What's the main symptom you're concerned about?" (e.g., pain, itching, dryness)     Itching, burning  2. LOCATION: "Where is the  _______ located?" (e.g., inside/outside, left/right)     Outside and inside 3. ONSET: "When did the  ________  start?"    Last Wednesday 4. PAIN: "Is there any pain?" If so, ask: "How bad is it?" (Scale: 1-10; mild, moderate, severe)     Just burning with urinating from irritation 5. ITCHING: "Is there any itching?" If so, ask: "How bad is it?" (Scale: 1-10; mild, moderate, severe)     8 6. CAUSE: "What do you think is causing the symptoms?"     Yeast infection 7. OTHER SYMPTOMS: "Do you have any other symptoms?" (e.g., vaginal bleeding, pain with urination)    No 8. PREGNANCY: "Is there any chance you are pregnant?" "When was your last menstrual period?"     No  7. CAUSE: "What do you think is causing the discharge?" "Have you had the same problem before? What happened  then?" 8. OTHER SYMPTOMS: "Do you have any other symptoms?" (e.g., fever, itching, vaginal bleeding, pain with urination, injury to genital area, vaginal foreign body)  Protocols used: [facility]

## 2017-10-05 NOTE — Telephone Encounter (Signed)
Pt advised and voiced understanding.   

## 2017-10-05 NOTE — Telephone Encounter (Signed)
OK, diflucan eRx'd: 150mg  tab, 1 tab daily x 3d.   -thx

## 2017-10-05 NOTE — Telephone Encounter (Signed)
Pt states Dr. Ardelle AntonWagoner ordered a Diflucan because the antibiotic had given her a yeast infection and it has worsened. Pt states she is diabetic.

## 2017-10-05 NOTE — Telephone Encounter (Signed)
Please advise. Thanks.  

## 2017-10-10 ENCOUNTER — Other Ambulatory Visit: Payer: Self-pay | Admitting: Internal Medicine

## 2017-10-10 ENCOUNTER — Other Ambulatory Visit: Payer: Self-pay | Admitting: Family Medicine

## 2017-10-12 NOTE — Telephone Encounter (Signed)
RF request for lamotrigine LOV: 07/31/17 Next ov: 11/06/17 Last written: 02/13/17 #30 w/ 6RF  Please advise. Thanks.

## 2017-10-13 ENCOUNTER — Ambulatory Visit (INDEPENDENT_AMBULATORY_CARE_PROVIDER_SITE_OTHER): Payer: 59 | Admitting: Podiatry

## 2017-10-13 ENCOUNTER — Ambulatory Visit (INDEPENDENT_AMBULATORY_CARE_PROVIDER_SITE_OTHER): Payer: 59

## 2017-10-13 DIAGNOSIS — S92502D Displaced unspecified fracture of left lesser toe(s), subsequent encounter for fracture with routine healing: Secondary | ICD-10-CM

## 2017-10-13 DIAGNOSIS — S92502A Displaced unspecified fracture of left lesser toe(s), initial encounter for closed fracture: Secondary | ICD-10-CM

## 2017-10-13 DIAGNOSIS — M722 Plantar fascial fibromatosis: Secondary | ICD-10-CM | POA: Diagnosis not present

## 2017-10-14 NOTE — Progress Notes (Signed)
Subjective: 57 year old female presents the office today for concerns and follow-up evaluation of left heel pain as well as a fracture of the left third toe.  She states that her third toe is doing better but it still somewhat tender.  She also is concerned that the heel is still starting to cause some more problems she points to the bottom of the heel where she gets symptoms.  Injection patient did help.  She is been stretching icing intermittently but she does not believe this is more effective the toe fracture.  She states that the toenail site itself is been doing well.  She finished her course of antibiotics and she denies any redness or drainage or any swelling. Denies any systemic complaints such as fevers, chills, nausea, vomiting. No acute changes since last appointment, and no other complaints at this time.   Objective: AAO x3, NAD DP/PT pulses palpable bilaterally, CRT less than 3 seconds There is decreased tenderness palpation of the distal portion of the left third toe.  There is faint edema and erythema but there is no drainage or pus.  No fluctuation or crepitation or clinical signs of infection.  There is tenderness palpation on the plantar medial tubercle of the calcaneus at the insertion of the plantar fascia.  Plantar fascia appears to be intact.  No pain with lateral compression of calcaneus.  Achilles tendon intact.  Other area of tenderness. No open lesions or pre-ulcerative lesions.  No pain with calf compression, swelling, warmth, erythema  Assessment: Left third toe fracture, heel pain/plantar fasciitis   Plan: -All treatment options discussed with the patient including all alternatives, risks, complications.  -X-rays were obtained and reviewed.  There is fracture of the distal aspect of the third toe.  No other evidence of acute fracture was identified today. -She wants to proceed with a steroid injection of the heel.  See procedure note below. -Given both issues I  recommend immobilization in the cam boot and this was dispensed today. -Ice to the area daily -Follow-up in 3 to 4 weeks or sooner if needed. -Patient encouraged to call the office with any questions, concerns, change in symptoms.   Procedure: Injection Tendon/Ligament Discussed alternatives, risks, complications and verbal consent was obtained.  Location: Left plantar fascia at the glabrous junction; medial approach. Skin Prep: Alcohol. Injectate: 0.5cc 0.5% marcaine plain, 0.5 cc 2% lidocaine plain and, 1 cc kenalog 10. Disposition: Patient tolerated procedure well. Injection site dressed with a band-aid.  Post-injection care was discussed and return precautions discussed.   Vivi Barrack DPM

## 2017-10-21 ENCOUNTER — Encounter: Payer: Self-pay | Admitting: Family Medicine

## 2017-11-05 ENCOUNTER — Encounter: Payer: Self-pay | Admitting: Family Medicine

## 2017-11-06 ENCOUNTER — Encounter: Payer: 59 | Admitting: Family Medicine

## 2017-11-06 ENCOUNTER — Ambulatory Visit: Payer: 59 | Admitting: Internal Medicine

## 2017-11-06 ENCOUNTER — Encounter: Payer: Self-pay | Admitting: Internal Medicine

## 2017-11-06 VITALS — BP 132/94 | HR 105 | Ht 65.5 in | Wt 245.8 lb

## 2017-11-06 DIAGNOSIS — IMO0001 Reserved for inherently not codable concepts without codable children: Secondary | ICD-10-CM

## 2017-11-06 DIAGNOSIS — E1165 Type 2 diabetes mellitus with hyperglycemia: Secondary | ICD-10-CM | POA: Diagnosis not present

## 2017-11-06 DIAGNOSIS — E042 Nontoxic multinodular goiter: Secondary | ICD-10-CM

## 2017-11-06 LAB — POCT GLYCOSYLATED HEMOGLOBIN (HGB A1C): HEMOGLOBIN A1C: 10.3 % — AB (ref 4.0–5.6)

## 2017-11-06 MED ORDER — CANAGLIFLOZIN 100 MG PO TABS: ORAL_TABLET | ORAL | 11 refills | Status: DC

## 2017-11-06 MED ORDER — SEMAGLUTIDE(0.25 OR 0.5MG/DOS) 2 MG/1.5ML ~~LOC~~ SOPN: 0.5000 mg | PEN_INJECTOR | SUBCUTANEOUS | 5 refills | Status: DC

## 2017-11-06 MED ORDER — INSULIN GLARGINE 100 UNIT/ML SOLOSTAR PEN: PEN_INJECTOR | SUBCUTANEOUS | 3 refills | Status: DC

## 2017-11-06 NOTE — Progress Notes (Addendum)
Patient ID: Kristi Guerrero, female   DOB: [birth year], 57 y.o.   MRN: [id number]    HPI  Kristi Guerrero is a 57 y.o.-year-old female, returning for f/u for MNG and insulin-dependent DM2, dx'ed in the 1990s- had GDM x2, uncontrolled, insulin-dependent. Last visit 3 months ago.  She is battling depression.  She has plantar fasciitis >> had steroid inj's. Dr. Ardelle Anton.  DM2:  Last HbA1c:  Lab Results  Component Value Date   HGBA1C 9.6 08/06/2017   HGBA1C 11.1 03/13/2017   HGBA1C 10.2 07/22/2016   She is on: - Invokana 100 mg in am >> out for 2 weeks! - Metformin 1000 mg 2x a day with meals. - Glipizide XL 10 mg in am.   - Lantus 45-48 units at bedtime Tried Januvia >> expensive. Tried Byetta >> did not work well.  Tried Victoza >> nausea. She was on Actos in the past.  Sugars from last visit: - am: 120-180 >> 180-240 >> mid 200s  - 2h after b'fast: n/c - lunch: 120s >> n/c - 2h after lunch: n/c - dinner: 112-130s >> mid200s >> n/c - 2h after dinner: n/c >> upper 200s >> 300s - bedtime: 180s >> n/c It is unclear at which level she has hypoglycemia awareness  No CKD. Last BUN/Cr: Lab Results  Component Value Date   BUN 13 07/31/2017   Lab Results  Component Value Date   CREATININE 0.67 07/31/2017  On lisinopril  + HL. Last Lipid panel: Lab Results  Component Value Date   CHOL 163 05/01/2017   HDL 38.50 (L) 05/01/2017   LDLCALC 97 05/01/2017   LDLDIRECT 194.0 01/30/2017   TRIG 138.0 05/01/2017   CHOLHDL 4 05/01/2017  On Lipitor.  Last eye exam:  2015: No DR  No numbness and tingling in her feet. Has a herniated disk >> numbness in toes since ~2009.  Latest foot exam was normal in 08/2017  MNG: Pt was dx with thyroid nodules in 2006.  She saw endocrinology  [facility].  Reviewed the reports of her previous studies: Thyroid U/S (04/20/2014) - 4 nodules, 2 enlarged from previous U/S in 2006:  Right thyroid lobe: 5.5 x 2.6 x 3.3 cm. Complex interpolar region nodule  measures 3.0 x 1.9 x 2.8 cm. It is predominately solid with asmall cystic area. On the previous report, this was described to be 1.5 x 1.1 x 1.7 cm.   Left thyroid lobe: 5.4 x 2.8 x 2.5 cm. 2.8 x 2.2 x 2.3 cm interpolar region solid heterogeneous nodule. This was previously described to measure 1.7 x 0.9 x 1.8 cm. Smaller lower pole nodule measures 1.5 x 1.4 x 1.3 cm there are some peripheral calcifications.   Isthmus Thickness: 4 mm. Solid 2.7 x 1.6 x 2.3 cm heterogeneous nodule. On the previous report, measurements of this nodule were 2.7 x 2.4 x 1.7 cm.   Lymphadenopathy: None visualized.  We Bx'ed the 4 nodules on 07/01/2014: - 2 x FLUS - 2 x benign  Repeated Bx's of the FLUS nodules on 07/26/2015: - R nodule: benign - isthmic nodule: FLUS/AUS  Afirma test for isthmic nodule on 07/26/2015: Benign  Latest TSH normal: Lab Results  Component Value Date   TSH 0.75 01/30/2017    Pt denies: - feeling nodules in neck - hoarseness - dysphagia - choking - SOB with lying down   Pt does have a + FH of follicular thyroid cancer in mother.   ROS: Constitutional: no weight gain/no weight loss, + fatigue, no subjective  hyperthermia, no subjective hypothermia Eyes: no blurry vision, no xerophthalmia ENT: no sore throat, + see HPI Cardiovascular: no CP/no SOB/no palpitations/+ at this visit, HbA1c leg swelling Respiratory: no cough/no SOB/no wheezing Gastrointestinal: no N/no V/no D/no C/no acid reflux Musculoskeletal: no muscle aches/no joint aches Skin: no rashes, no hair loss Neurological: no tremors/no numbness/no tingling/no dizziness  I reviewed pt's medications, allergies, PMH, social hx, family hx, and changes were documented in the history of present illness. Otherwise, unchanged from my initial visit note.  Past Medical History:  Diagnosis Date  . Abnormal nuclear stress test 12/31/11   Low risk lexiscan, but left heart cath was recommended and this showed normal  coronary arteries and normal LV function. (01/01/12)  . Anxiety    + ?mood disorder (awaiting old psych records as of 12/19/11.  . Borderline personality disorder (HCC)   . Carpal tunnel syndrome on both sides   . Chronic pain syndrome    low back pain  . Colon cancer screening    Pt did not return cologuard 10/2016.  She has chosen not to get colonoscopy as of 2018 f/u.  Pt did not return cologuard 09/2017.  Marland Kitchen Daytime somnolence    Nuvigil rx'd by her psychiatrist.  Pt reports that no sleep study has been done.  . DDD (degenerative disc disease), lumbar    Pt says she has hx of herniated disc that has resulted in some numbness in a few toes on left foot.  . 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  I have yet to see/hear any evidence of borderline personality d/o in her since she has been my pt.  . DM2 (diabetes mellitus, type 2) (HCC)    Managed by ENDO--Dr. Elvera Lennox.  +hx of GDM both pregnancies, dx'd with DM 2 in her 30s--denies hx of complications  with eyes, kidneys, nerves, or CV system.  . Hepatic steatosis   . History of vitamin D deficiency 2009  . Hyperlipidemia   . Hypertension   . Iron deficiency anemia 2013   Hx of: Likely occult upper GI bleeding.  Hb 8.6--came up to 12 at most recent check after getting on integra, which she now continues once daily.   iFOB was negative 01/13/12.  She says she had been taking excessive NSAIDs at the time.  She has never had a colonoscopy.  . Low back pain 12/19/2011  . Migraine syndrome   . Mixed urge and stress incontinence   . Multinodular goiter    Hx of multiple benign biopsies; repeated u/s's showed no change until the one on 04/2014.  FNA 06/2013 showed R and isthmus nodules intermediate so f/u bx 6 mo recommended.  Repeat bx 07/26/15 showed R lobe benign, isthmus  atypical cells of undetermined signif,--detailed path analysis was done and it was BENIGN.  Plan per endo is repeat u/s 1-2 yrs from date of most recent biopsy.-  . Obesity   . Plantar fasciitis    Steroid injection by podiatrist 09/2017  . Renal cyst, acquired, right 2017  . Toe fracture, left 09/2017   Distal phalanx L third toe (saw podiatrist)   Past Surgical History:  Procedure Laterality Date  . CARDIAC CATHETERIZATION  01/01/12   Normal coronaries and normal LV function.  Marland Kitchen CESAREAN SECTION     X  2  . FNA bx Thyroid nodules  06/29/14   x 4: some findings to support benign cyst and some "cellular atypica of indeterminate significance".--followed by Dr. Elvera Lennox.  Repeat bx 05/2015 BENIGN.   History   Social History Main Topics  . Smoking status: Never Smoker   . Smokeless tobacco: Never Used  . Alcohol Use: No  . Drug Use: No   Social History Narrative   Divorced, 2 grown children (one in [place] and one in [place]).  One grandchild.   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   No T/A/Ds.   Exercise: occ goes to the [facility].   Currently living in [place], [place].   Current Outpatient Medications on File Prior to Visit  Medication Sig Dispense Refill  . aspirin 81 MG tablet Take 81 mg by mouth daily.    Marland Kitchen atorvastatin (LIPITOR) 80 MG tablet Take 1 tablet (80 mg total) by mouth daily. 90 tablet 1  . busPIRone (BUSPAR) 7.5 MG tablet 1/2 tab po bid 90 tablet 1  . butalbital-acetaminophen-caffeine (FIORICET, ESGIC) 50-325-40 MG tablet take 1 to 2 tablets by mouth twice a day if needed for headache 60 tablet 1  . canagliflozin (INVOKANA) 100 MG TABS tablet TAKE 1 TABLET BY MOUTH DAILY BEFORE THE FIRST MEAL OF THE DAY 90 tablet 1  . clindamycin (CLEOCIN) 300 MG capsule Take 1 capsule (300 mg total) by mouth 3 (three) times daily. 30 capsule 0  . DULoxetine (CYMBALTA) 60 MG capsule TAKE 1 CAPSULE BY MOUTH EVERY DAY 30 capsule 5  . fluconazole (DIFLUCAN) 150 MG  tablet 1 tab po qd x 3d 3 tablet 1  . glipiZIDE (GLUCOTROL XL) 5 MG 24 hr tablet TAKE 2 TABLETS BY MOUTH ONCE DAILY WITH BREAKFAST 60 tablet 6  . glucose blood (ONETOUCH VERIO) test strip Use as instructed to check sugar 2 times daily 200 each 5  . hydrochlorothiazide (HYDRODIURIL) 25 MG tablet TAKE 1 TABLET BY MOUTH EVERY DAY 30 tablet 5  . HYDROcodone-acetaminophen (NORCO/VICODIN) 5-325 MG tablet TAKE 1 TO 2 TABLETS EVERY 6 HOURS AS NEEDED FOR PAIN 120 tablet 0  . hydrocortisone (PROCTOSOL HC) 2.5 % rectal cream apply rectally twice a day 30 g 6  . Insulin Glargine (LANTUS SOLOSTAR) 100 UNIT/ML Solostar Pen Inject 30 Units into the skin daily at 10 pm. (Patient taking differently: Inject 45 Units into the skin daily at 10 pm. ) 15 mL 3  . Insulin Pen Needle 31G X 4 MM MISC 1 each by Does not apply route at bedtime. 100 each 5  . lamoTRIgine (LAMICTAL) 200 MG tablet TAKE 1 TABLET BY MOUTH EVERY DAY 30 tablet 6  . Lancet Devices (ONE TOUCH DELICA LANCING DEV) MISC Use to check sugar 2 times daily 200 each 5  . lisinopril (PRINIVIL,ZESTRIL) 40 MG tablet TAKE 1 TABLET BY MOUTH EVERY DAY 90 tablet 1  . LORazepam (ATIVAN) 0.5 MG tablet 2 tabs po qhs 60 tablet 5  . meloxicam (MOBIC) 7.5 MG tablet Take 1 tablet (7.5 mg total) by mouth daily. 14 tablet 0  . metFORMIN (GLUCOPHAGE) 1000 MG tablet TAKE 1 TABLET BY MOUTH TWICE A DAY FOR DIABETES 180 tablet 1  . metoprolol tartrate (LOPRESSOR) 25 MG tablet Take 1 tablet (25 mg total) by mouth 2 (two) times daily. 180 tablet 3  . Multiple Vitamin (MULTIVITAMIN) tablet Take 1 tablet by mouth daily.    . Multiple Vitamins-Minerals (PRESERVISION AREDS) CAPS Take 1 capsule by mouth daily.    Marland Kitchen  Omega-3 Fatty Acids (FISH OIL) 1000 MG CAPS Take 1 capsule by mouth at bedtime.    Marland Kitchen omeprazole (PRILOSEC) 40 MG capsule TAKE ONE CAPSULE BY MOUTH EVERY DAY 30 capsule 5  . oxybutynin (DITROPAN-XL) 10 MG 24 hr tablet 1 tab po qd 90 tablet 3  . promethazine (PHENERGAN) 12.5  MG tablet 1-2 tabs po q6h prn nausea/vomiting 20 tablet 3   No current facility-administered medications on file prior to visit.    Allergies  Allergen Reactions  . Levaquin [Levofloxacin In D5w] Hives  . Penicillins     Blistery rash  . Victoza [Liraglutide] Nausea Only   Family History  Problem Relation Age of Onset  . Cancer Mother        breast and follicular thyroid cancer  . Depression Mother   . Breast cancer Mother   . Diabetes Father   . Alcohol abuse Sister   . Hypertension Maternal Grandmother   . Hyperlipidemia Maternal Grandmother   . Heart disease Maternal Grandmother   . Stroke Maternal Grandmother   . Cancer Maternal Grandfather        colon cancer  . Stroke Maternal Grandfather   . Hypertension Maternal Grandfather   . Hyperlipidemia Maternal Grandfather   . Heart disease Maternal Grandfather    PE: BP (!) 132/94   Pulse (!) 105   Ht 5' 5.5" (1.664 m)   Wt 245 lb 12.8 oz (111.5 kg)   LMP 06/04/2013   SpO2 98%   BMI 40.28 kg/m  Body mass index is 40.28 kg/m. Wt Readings from Last 3 Encounters:  11/06/17 245 lb 12.8 oz (111.5 kg)  08/06/17 253 lb 4 oz (114.9 kg)  07/31/17 246 lb (111.6 kg)   Constitutional: overweight, in NAD Eyes: PERRLA, EOMI, no exophthalmos ENT: moist mucous membranes, + right thyroid fullness and isthmic nodule palpable, no cervical lymphadenopathy Cardiovascular: tachycardia, RR, No MRG Respiratory: CTA B Gastrointestinal: abdomen soft, NT, ND, BS+ Musculoskeletal: no deformities, strength intact in all 4 Skin: moist, warm, no rashes Neurological: no tremor with outstretched hands, DTR normal in all 4  Assessment: 1. DM2, insulin-dep., uncontrolled  2. MNG - thyroid U/S (04/20/2014):   Adequacy Reason Satisfactory For Evaluation. Diagnosis THYROID, FINE NEEDLE ASPIRATION ISTHMUS, (SPECIMEN 1 OF 4, COLLECTED ON 06/29/2014) ATYPIA OF UNDETERMINED SIGNIFICANCE OR FOLLICULAR LESION OF UNDETERMINED SIGNIFICANCE  (BETHESDA CATEGORY III). SEE COMMENT. COMMENT: THE SPECIMEN CONSISTS OF SMALL AND MEDIUM SIZED GROUPS OF FOLLICULAR EPITHELIAL CELLS AND SOME BACKGROUND COLLOID. SOME OF THE GROUPS OF CELLS ARE ARRANGED AS MICROFOLLICLES. THERE ARE SCATTERED INTRANUCLEAR GROOVES. BASED ON THESE FEATURES, A FOLLICULAR LESION/NEOPLASM CAN NOT BE ENTIRELY RULED OUT. Pecola Leisure MD Pathologist, Electronic Signature (Case signed 06/30/2014) Specimen Clinical Information Solid 2.7 x 1.6 x 2.3cm heterogeneous nodule Source Thyroid, Fine Needle Aspiration, Isthmus, (Specimen 1 of 4, collected on 06/29/2014)   Adequacy Reason Satisfactory For Evaluation. Diagnosis THYROID, FINE NEEDLE ASPIRATION (SPECIMEN 2 OF 4 COLLECTED 06-29-2014) ATYPIA OF UNDETERMINED SIGNIFICANCE OR FOLLICULAR LESION OF UNDETERMINED SIGNIFICANCE (BETHESDA CATEGORY III). SEE COMMENT. COMMENT: THE SPECIMEN IS SOMEWHAT HYPOCELLULAR, HINDERING OPTIMAL CYTOLOGIC EVALUATION. HOWEVER, THERE ARE SCATTERED SMALL GROUPS OF FOLLICULAR EPITHELIAL CELLS WITH MILD CYTOLOGIC ATYPIA, INCLUDING CONSPICUOUS INTRANUCLEAR GROOVES. BASED ON THESE FEATURES, A FOLLICULAR LESION/NEOPLASM CAN NOT BE ENTIRELY RULED OUT. Pecola Leisure MD Pathologist, Electronic Signature (Case signed 06/30/2014) Specimen Clinical Information Right interpolar region nodule measures 3.0 x 1.9 x 2.8cm, It is predominantely solid with a small cystic area Source Thyroid, Fine Needle Aspiration, Right, (Specimen 2 of  4, collected on 06/29/2014)   Adequacy Reason Satisfactory For Evaluation. Diagnosis FINE NEEDLE ASPIRATION, THYROID, LLP (SPECIMEN 3 OF 4 COLLECTED ON 06/29/14): CONSISTENT WITH BENIGN FOLLICULAR NODULE (BETHESDA CATEGORY II). Pecola Leisure MD Pathologist, Electronic Signature (Case signed 06/30/2014) Specimen Clinical Information Smaller llp nodule 1.5 x 1.4 x 1.3cm Source Thyroid, Fine Needle Aspiration, LLP, (Specimen 3 of 4, collected on  06/29/2014)   Adequacy Reason Satisfactory But Limited For Evaluation, Scant Cellularity. Diagnosis THYROID, FINE NEEDLE ASPIRATION: LEFT INTERPOLAR (4 OF 4 COLLECTED ON 06/29/2014) SCANT FOLLICULAR EPITHELIUM PRESENT (BETHESDA CATEGORY I). FINDINGS CONSISTENT WITH THE CONTENTS OF A CYST. Pecola Leisure MD Pathologist, Electronic Signature (Case signed 06/30/2014) Specimen Clinical Information 2.8 x 2.2 x 2.3cm interpolar region solid heterogeneous nodule Source Thyroid, Fine Needle Aspiration, Left Interpolar, (Specimen 4 of 4, collected on 06/29/2014)  (07/27/2015): Adequacy Reason Satisfactory For Evaluation. Diagnosis THYROID, FINE NEEDLE ASPIRATION RIGHT MID POLE, (SPECIMEN 1 OF 2 COLLECTED 07/26/2015) CONSISTENT WITH BENIGN FOLLICULAR NODULE (BETHESDA CATEGORY II). Pecola Leisure MD Pathologist, Electronic Signature (Case signed 07/27/2015) Specimen Clinical Information Previous biospy 06/29/14, Dominant 33 x 25 x 31 mm mostly solid nodule, right mid lobe (previously 30 x 19 x 28) Source Thyroid, Fine Needle Aspiration, RMP, (Specimen 1 of 2, collected on 07/26/2015)  Adequacy Reason Satisfactory For Evaluation. Diagnosis THYROID, FINE NEEDLE ASPIRATION, RIGHT ISTHMUS (SPECIMEN 2 OF 2 COLLECTED 07-26-2015) ATYPIA OF UNDETERMINED SIGNIFICANCE OR FOLLICULAR LESION OF UNDETERMINED SIGNIFICANCE (BETHESDA CATEGORY III). SEE COMMENT. COMMENT: THE SPECIMEN IS MILDLY CELLULAR AND CONSISTS OF SMALL AND MEDIUM SIZED GROUPS OF FOLLICULAR EPITHELIAL CELLS WITH MINIMAL CYTOLOGIC ATYPIA INCLUDING NUCLEAR OVERLAPPING. A SAMPLE WILL BE SENT FOR AFFIRMA TESTING AND THE RESULTS REPORTED SEPARATELY. Pecola Leisure MD Pathologist, Electronic Signature (Case signed 07/27/2015) Specimen Clinical Information Dominant 27 x 17 x 23 mm slightly echogenic solid nodule, right of midline (previously 27 x 16 x 23) Source Thyroid, Fine Needle Aspiration, Right Isthmus, (Specimen 2 of 2, collected on  07/26/2015)  The R thyroid nodule is benign.  The isthmic nodule is still a AUS/FLUS, but Afirma result is BENIGN. (MTC negative).   PLAN:  1. DM2 - Patient with long-standing, uncontrolled, type 2 diabetes, on oral antidiabetic regimen, to which we added basal insulin in the last year.  However, she lost her insurance and was off several of her medicines and her HbA1c increased to 11.1%.  After that, she restarted her medicines and her HbA1c started to improve, at last visit, 9.6%.  She was not checking sugars at that time so I strongly advised her to start. - At this visit, HbA1c is 10.3% (higher) -We discussed about the fact that ideally she should not run out of her medications.  Given her a card for him will, and send a new prescription to her pharmacy. -Since HbA1c is still high, we discussed about adding Ozempic.  discussed about advantages and possible side effects.  Sent a prescription for the lower dose to her pharmacy and advised to increase as tolerated.  Also given an Ozempic sample. - I advised her to: Patient Instructions  Please continue: - Invokana 100 mg in am - Metformin 1000 mg 2x a day with meals. - Glipizide XL 10 mg in am.   - Lantus 45 units at bedtime  Please add Ozempic 0.25 mg weekly in am x 4 weeks, then increase to 0.5 mg weekly.  Please call and schedule an eye appt with Dr. Randon Goldsmith: [facility]:  Dr. Jeni Salles MD ?  Address: [address] [place], [place] [address]  [phone]  Please return in 3 months with your sugar log.     - Start checking sugars at different times of the day - check 1x a day, rotating checks - advised for yearly eye exams >> she is not UTD - Return to clinic in 3 mo with sugar log   2. MNG  -Patient's right and isthmic thyroid nodules were initially indeterminate on first biopsy.  On the second biopsy, the right thyroid nodule was benign, while these may nodule was again indeterminate.   The  [facility] molecular marker test was benign, though.  In this case, the nodule is most likely benign and we can continue to follow by ultrasound in 1 to 2 years after the previous.  She is due for another ultrasound now.  Will order. - No neck compression symptoms   Orders Placed This Encounter  Procedures  . US THYROID    Standing Status:   Future    Number of Occurrences:   1    Standing Expiration Date:   01/07/2019    Order Specific Question:   Reason for Exam (SYMPTOM  OR DIAGNOSIS REQUIRED)    Answer:   f/u thyroid nodules    Order Specific Question:   Preferred imaging location?    Answer:   [facility]  . POCT glycosylated hemoglobin (Hb A1C)   Reading Physician Reading Date Result Priority  Richarda Overlie, MD 11/26/2017     Narrative    CLINICAL DATA: Follow-up thyroid nodules. Four thyroid nodules were biopsied on 06/29/2014. Repeat biopsy of the isthmus and right thyroid nodule on 07/23/2017.  EXAM: THYROID ULTRASOUND  TECHNIQUE: Ultrasound examination of the thyroid gland and adjacent soft tissues was performed.  COMPARISON: 07/02/2015  FINDINGS: Parenchymal Echotexture: Markedly heterogenous  Isthmus: 0.5 cm, previously 1.1 cm  Right lobe: 5.2 x 2.3 x 2.9 cm, previously 5.8 x 3.3 x 3.2 cm  Left lobe: 5.2 x 2.0 x 2.3 cm, previously 5.9 x 2.5 x 2.5 cm  _________________________________________________________  Estimated total number of nodules >/= 1 cm: 4  Number of spongiform nodules >/= 2 cm not described below (TR1): 0  Number of mixed cystic and solid nodules >/= 1.5 cm not described below (TR2): 0  _________________________________________________________  Previous biopsy of the isthmus nodule, right thyroid nodule and the 2 left thyroid nodules.  The isthmus nodule is heterogeneous and appears to have some peripheral calcifications. The isthmus nodule measures 2.5 x 1.3 x 2.0 cm and previously measured 2.7 x 1.7 x 2.3 cm.  Heterogeneous  isoechoic nodule in the mid right thyroid lobe measures 3.2 x 2.0 x 2.9 cm and previously measured 3.3 x 2.5 x 3.1 cm. Cannot exclude a few macro calcifications within this right thyroid nodule.  Heterogeneous isoechoic nodule in the mid left thyroid lobe measures 2.9 x 2.0 x 2.3 cm and previously measured 3.2 x 2.1 x 2.4 cm.  Slightly hypoechoic nodule in the inferior left thyroid lobe measures 1.4 x 1.4 x 1.5 cm and previously measured 1.4 x 1.2 x 1.4 cm. Question macrocalcification along the anterior aspect of this nodule.  IMPRESSION: Multinodular goiter. The previously biopsied dominant nodules have minimally changed in size.  No new suspicious thyroid nodules.   Electronically Signed By: Richarda Overlie M.D. On: 11/26/2017 10:45    Stable or slightly smaller thyroid nodules. We can continue to follow them expectantly for now.  Carlus Pavlov, MD PhD [facility]

## 2017-11-06 NOTE — Patient Instructions (Addendum)
Please continue: - Invokana 100 mg in am - Metformin 1000 mg 2x a day with meals. - Glipizide XL 10 mg in am.   - Lantus 45 units at bedtime  Please add Ozempic 0.25 mg weekly in am x 4 weeks, then increase to 0.5 mg weekly.  Please call and schedule an eye appt with Dr. Randon Goldsmith: [facility]:  Dr. Jeni Salles MD ?  Address: [address] [place], [place], [place] [address]  [phone]  Please return in 3 months with your sugar log.

## 2017-11-10 ENCOUNTER — Ambulatory Visit: Payer: 59 | Admitting: Podiatry

## 2017-11-19 ENCOUNTER — Other Ambulatory Visit: Payer: Self-pay | Admitting: Family Medicine

## 2017-11-25 ENCOUNTER — Ambulatory Visit
Admission: RE | Admit: 2017-11-25 | Discharge: 2017-11-25 | Disposition: A | Discharge status: Home / Self Care | Payer: 59 | Source: Ambulatory Visit | Attending: Internal Medicine | Admitting: Internal Medicine

## 2017-12-30 ENCOUNTER — Other Ambulatory Visit: Payer: Self-pay | Admitting: Family Medicine

## 2017-12-30 ENCOUNTER — Encounter: Payer: 59 | Admitting: Family Medicine

## 2017-12-30 ENCOUNTER — Encounter

## 2018-01-05 ENCOUNTER — Other Ambulatory Visit: Payer: Self-pay | Admitting: Family Medicine

## 2018-01-06 ENCOUNTER — Other Ambulatory Visit: Payer: Self-pay | Admitting: Family Medicine

## 2018-01-06 NOTE — Telephone Encounter (Signed)
Copied from CRM [id number]. Topic: Quick Communication - Rx Refill/Question >> Jan 06, 2018  3:27 PM Alexander BergeronBarksdale, Harvey B wrote: Medication: DULoxetine (CYMBALTA) 60 MG capsule [[id number]][[id number]]   Pt called here to see why the medication was denied and is needing to know what needs to be done to have this refilled

## 2018-01-07 NOTE — Telephone Encounter (Signed)
Patient will need to be called to let her know this Cymbalta was sent to [facility] on 12/30/17 30 tab/0 refill.

## 2018-01-08 NOTE — Telephone Encounter (Signed)
Called patient and left message on her answer machine that her prescription for Cymbalta  was at [facility].

## 2018-01-18 ENCOUNTER — Encounter: Payer: Self-pay | Admitting: Family Medicine

## 2018-01-18 ENCOUNTER — Ambulatory Visit (INDEPENDENT_AMBULATORY_CARE_PROVIDER_SITE_OTHER): Payer: 59 | Admitting: Family Medicine

## 2018-01-18 VITALS — BP 109/73 | HR 71 | Temp 97.6°F | Resp 16 | Ht 65.0 in | Wt 241.4 lb

## 2018-01-18 DIAGNOSIS — E7800 Pure hypercholesterolemia, unspecified: Secondary | ICD-10-CM | POA: Diagnosis not present

## 2018-01-18 DIAGNOSIS — I10 Essential (primary) hypertension: Secondary | ICD-10-CM

## 2018-01-18 DIAGNOSIS — Z0000 Encounter for general adult medical examination without abnormal findings: Secondary | ICD-10-CM

## 2018-01-18 DIAGNOSIS — Z23 Encounter for immunization: Secondary | ICD-10-CM

## 2018-01-18 DIAGNOSIS — E119 Type 2 diabetes mellitus without complications: Secondary | ICD-10-CM

## 2018-01-18 LAB — CBC WITH DIFFERENTIAL/PLATELET
BASOS PCT: 0.7 % (ref 0.0–3.0)
Basophils Absolute: 0 10*3/uL (ref 0.0–0.1)
EOS ABS: 0.1 10*3/uL (ref 0.0–0.7)
EOS PCT: 1.8 % (ref 0.0–5.0)
HCT: 35.8 % — ABNORMAL LOW (ref 36.0–46.0)
Hemoglobin: 11.9 g/dL — ABNORMAL LOW (ref 12.0–15.0)
Lymphocytes Relative: 37.5 % (ref 12.0–46.0)
Lymphs Abs: 1.8 10*3/uL (ref 0.7–4.0)
MCHC: 33.2 g/dL (ref 30.0–36.0)
MCV: 84.9 fl (ref 78.0–100.0)
Monocytes Absolute: 0.3 10*3/uL (ref 0.1–1.0)
Monocytes Relative: 6.8 % (ref 3.0–12.0)
NEUTROS ABS: 2.6 10*3/uL (ref 1.4–7.7)
Neutrophils Relative %: 53.2 % (ref 43.0–77.0)
PLATELETS: 255 10*3/uL (ref 150.0–400.0)
RBC: 4.22 Mil/uL (ref 3.87–5.11)
RDW: 14.6 % (ref 11.5–15.5)
WBC: 4.8 10*3/uL (ref 4.0–10.5)

## 2018-01-18 LAB — LIPID PANEL
CHOL/HDL RATIO: 5
CHOLESTEROL: 159 mg/dL (ref 0–200)
HDL: 34 mg/dL — ABNORMAL LOW (ref >39.00)
LDL Cholesterol: 88 mg/dL (ref 0–99)
NONHDL: 124.6
Triglycerides: 184 mg/dL — ABNORMAL HIGH (ref 0.0–149.0)
VLDL: 36.8 mg/dL (ref 0.0–40.0)

## 2018-01-18 LAB — COMPREHENSIVE METABOLIC PANEL
ALT: 15 U/L (ref 0–35)
AST: 10 U/L (ref 0–37)
Albumin: 3.8 g/dL (ref 3.5–5.2)
Alkaline Phosphatase: 95 U/L (ref 39–117)
BUN: 15 mg/dL (ref 6–23)
CHLORIDE: 101 meq/L (ref 96–112)
CO2: 30 meq/L (ref 19–32)
CREATININE: 0.82 mg/dL (ref 0.40–1.20)
Calcium: 9.2 mg/dL (ref 8.4–10.5)
GFR: 76.24 mL/min (ref >60.00)
GLUCOSE: 205 mg/dL — AB (ref 70–99)
POTASSIUM: 3.8 meq/L (ref 3.5–5.1)
SODIUM: 138 meq/L (ref 135–145)
Total Bilirubin: 0.4 mg/dL (ref 0.2–1.2)
Total Protein: 6.7 g/dL (ref 6.0–8.3)

## 2018-01-18 LAB — TSH: TSH: 1.34 u[IU]/mL (ref 0.35–4.50)

## 2018-01-18 NOTE — Addendum Note (Signed)
Addended by: Regan RakersAY, LAURA K on: 01/18/2018 09:10 AM   Modules accepted: Orders

## 2018-01-18 NOTE — Patient Instructions (Signed)
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
## 2018-01-19 ENCOUNTER — Encounter: Payer: Self-pay | Admitting: *Deleted

## 2018-02-01 ENCOUNTER — Other Ambulatory Visit: Payer: Self-pay | Admitting: Family Medicine

## 2018-02-05 ENCOUNTER — Ambulatory Visit: Payer: 59 | Admitting: Internal Medicine

## 2018-02-05 ENCOUNTER — Encounter: Payer: Self-pay | Admitting: Internal Medicine

## 2018-02-05 ENCOUNTER — Ambulatory Visit (INDEPENDENT_AMBULATORY_CARE_PROVIDER_SITE_OTHER): Payer: 59 | Admitting: Internal Medicine

## 2018-02-05 VITALS — BP 100/68 | HR 69 | Ht 65.0 in | Wt 238.8 lb

## 2018-02-05 DIAGNOSIS — E1165 Type 2 diabetes mellitus with hyperglycemia: Secondary | ICD-10-CM

## 2018-02-05 DIAGNOSIS — E042 Nontoxic multinodular goiter: Secondary | ICD-10-CM

## 2018-02-05 DIAGNOSIS — IMO0001 Reserved for inherently not codable concepts without codable children: Secondary | ICD-10-CM

## 2018-02-05 DIAGNOSIS — E785 Hyperlipidemia, unspecified: Secondary | ICD-10-CM

## 2018-02-05 MED ORDER — SEMAGLUTIDE (1 MG/DOSE) 2 MG/1.5ML ~~LOC~~ SOPN: 1.0000 mg | PEN_INJECTOR | SUBCUTANEOUS | 5 refills | Status: DC

## 2018-02-05 NOTE — Patient Instructions (Addendum)
Please continue: - Invokana 100 mg in am - Metformin 1000 mg 2x a day with meals. - Glipizide XL 10 mg in am.   - Lantus 45 units at bedtime  Please increase Ozempic to 1 mg weekly.  Please call and schedule an eye appt with Dr. Randon GoldsmithLyles: [facility]:  Dr. Jeni SallesGraham W. Lyles MD ?  Address: [address], [place], [place] [address]  [phone]  Please return in 3 months with your sugar log.

## 2018-02-05 NOTE — Progress Notes (Signed)
Patient ID: Kristi Guerrero, female   DOB: 04-21-1961, 57 y.o.   MRN: [id number]    HPI  Kristi Guerrero is a 57 y.o.-year-old female, returning for f/u for MNG and insulin-dependent DM2, dx'ed in the 1990s- had GDM x2, uncontrolled, insulin-dependent. Last visit 3 months ago.  At last visit, her depression was worse and sugars were also worse.  HbA1c was higher than before.  I advised her to add Ozempic.  She has diarrhea for few days.  DM2:  Last HbA1c:  Lab Results  Component Value Date   HGBA1C 10.3 (A) 11/06/2017   HGBA1C 9.6 08/06/2017   HGBA1C 11.1 03/13/2017   HGBA1C 10.2 07/22/2016   HGBA1C 12.1 04/08/2016   HGBA1C 6.7 06/13/2015   HGBA1C 8.2 (H) 11/17/2014   HGBA1C 8.4 (H) 06/19/2014   HGBA1C 8.0 (H) 03/06/2014   HGBA1C 8.5 (H) 10/21/2013   HGBA1C 7.5 (H) 03/31/2013   HGBA1C 6.3 (H) 09/29/2012   HGBA1C 6.5 05/31/2012   HGBA1C 7.4 (H) 12/19/2011   She is on: - Invokana 100 mg in a.m. - Metformin 1000 mg 2x a day with meals. - Glipizide XL 10 mg in am.   - Ozempic 0.5 mg weekly - 10/2017 - Lantus 45 units at bedtime Tried Januvia >> expensive. Tried Byetta >> did not work well.  Tried Victoza >> nausea. She was on Actos in the past.  She is checking her sugars 0-1: - am: 120-180 >> 180-240 >> mid 200s >> 150-200 - 2h after b'fast: n/c - lunch: 120s >> n/c - 2h after lunch: n/c - dinner: 112-130s >> mid200s >> n/c - 2h after dinner: n/c >> upper 200s >> 300s >> 200 - bedtime: 180s >> n/c It is unclear at which level she has hypoglycemia awareness  No CKD. Last BUN/Cr: Lab Results  Component Value Date   BUN 15 01/18/2018   Lab Results  Component Value Date   CREATININE 0.82 01/18/2018  On lisinopril  + HL. Last Lipid panel: Lab Results  Component Value Date   CHOL 159 01/18/2018   HDL 34.00 (L) 01/18/2018   LDLCALC 88 01/18/2018   LDLDIRECT 194.0 01/30/2017   TRIG 184.0 (H) 01/18/2018   CHOLHDL 5 01/18/2018  On Lipitor.  Last eye exam: 2015: No  DR  Denies numbness and tingling in her feet. Has a herniated disk >> numbness in toes since ~2009.  Latest foot exam was normal in 08/2017.  MNG: Pt was dx with thyroid nodules in 2006.  She previously saw endocrinology in [place].  Reviewed the report of her previous studies: Thyroid U/S (04/20/2014) - 4 nodules, 2 enlarged from previous U/S in 2006:  Right thyroid lobe: 5.5 x 2.6 x 3.3 cm. Complex interpolar region nodule measures 3.0 x 1.9 x 2.8 cm. It is predominately solid with asmall cystic area. On the previous report, this was described to be 1.5 x 1.1 x 1.7 cm.   Left thyroid lobe: 5.4 x 2.8 x 2.5 cm. 2.8 x 2.2 x 2.3 cm interpolar region solid heterogeneous nodule. This was previously described to measure 1.7 x 0.9 x 1.8 cm. Smaller lower pole nodule measures 1.5 x 1.4 x 1.3 cm there are some peripheral calcifications.   Isthmus Thickness: 4 mm. Solid 2.7 x 1.6 x 2.3 cm heterogeneous nodule. On the previous report, measurements of this nodule were 2.7 x 2.4 x 1.7 cm.   Lymphadenopathy: None visualized.  We Bx'ed the 4 nodules on 07/01/2014: - 2 x FLUS - 2 x benign  Repeated Bx's of the FLUS nodules on 07/26/2015: - R nodule: benign - isthmic nodule: FLUS/AUS  Afirma test for isthmic nodule on 07/26/2015: Benign  Thyroid U/S (11/26/2017): Thyroid nodules are stable or smaller  Latest TSH normal: Lab Results  Component Value Date   TSH 1.34 01/18/2018    Pt denies: - feeling nodules in neck - hoarseness - dysphagia - choking - SOB with lying down  Pt does have a + FH of follicular thyroid cancer in mother.   Works in [employer].  ROS: Constitutional: no weight gain/no weight loss, + fatigue, + subjective hyperthermia, no subjective hypothermia Eyes: no blurry vision, no xerophthalmia ENT: no sore throat, + see HPI Cardiovascular: no CP/no SOB/no palpitations/no leg swelling Respiratory: no cough/no SOB/no wheezing Gastrointestinal: no N/no V/+ D/no  C/no acid reflux Musculoskeletal: no muscle aches/no joint aches Skin: no rashes, no hair loss Neurological: no tremors/no numbness/no tingling/no dizziness  I reviewed pt's medications, allergies, PMH, social hx, family hx, and changes were documented in the history of present illness. Otherwise, unchanged from my initial visit note.  Past Medical History:  Diagnosis Date  . Abnormal nuclear stress test 12/31/11   Low risk lexiscan, but left heart cath was recommended and this showed normal coronary arteries and normal LV function. (01/01/12)  . Anxiety    + ?mood disorder (awaiting old psych records as of 12/19/11.  . Borderline personality disorder (HCC)   . Carpal tunnel syndrome on both sides   . Chronic pain syndrome    low back pain  . Colon cancer screening    Pt did not return cologuard 10/2016.  She has chosen not to get colonoscopy as of 2018 f/u.  Pt did not return cologuard 09/2017.  Marland Kitchen Daytime somnolence    Nuvigil rx'd by her psychiatrist.  Pt reports that no sleep study has been done.  . DDD (degenerative disc disease), lumbar    Pt says she has hx of herniated disc that has resulted in some numbness in a few toes on left foot.  . 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  I have yet to see/hear any evidence of borderline personality d/o in her since she has been my pt.  . DM2 (diabetes mellitus, type 2) (HCC)    Managed by ENDO--Dr. Elvera Lennox.  +hx of GDM both pregnancies, dx'd with DM 2 in her 30s--denies hx of complications  with eyes, kidneys, nerves, or CV system.  . Hepatic steatosis   . History of vitamin D deficiency 2009  . Hyperlipidemia   . Hypertension   . Iron deficiency anemia 2013   Hx of: Likely occult upper GI bleeding.  Hb 8.6--came up to 12 at most recent check after getting on  integra, which she now continues once daily.   iFOB was negative 01/13/12.  She says she had been taking excessive NSAIDs at the time.  She has never had a colonoscopy.  . Low back pain 12/19/2011  . Migraine syndrome   . Mixed urge and stress incontinence   . Multinodular goiter    Hx of multiple benign biopsies; repeated u/s's showed no change until the one on 04/2014.  FNA 06/2013 showed R and isthmus nodules intermediate so f/u bx 6 mo recommended.  Repeat bx 07/26/15 showed R lobe benign, isthmus  atypical cells of undetermined signif,--detailed path analysis was done and it was BENIGN.  Plan per endo is repeat u/s 1-2 yrs from date of most recent biopsy.-  . Obesity   . Plantar fasciitis    Steroid injection by podiatrist 09/2017  . Renal cyst, acquired, right 2017  . Toe fracture, left 09/2017   Distal phalanx L third toe (saw podiatrist)   Past Surgical History:  Procedure Laterality Date  . CARDIAC CATHETERIZATION  01/01/12   Normal coronaries and normal LV function.  Marland Kitchen CESAREAN SECTION     X 2  . FNA bx Thyroid nodules  06/29/14   x 4: some findings to support benign cyst and some "cellular atypica of indeterminate significance".--followed by Dr. Elvera Lennox.  Repeat bx 05/2015 BENIGN.   History   Social History Main Topics  . Smoking status: Never Smoker   . Smokeless tobacco: Never Used  . Alcohol Use: No  . Drug Use: No   Social History Narrative   Divorced, 2 grown children (one in [place] and one in [place]).  One grandchild.   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   No T/A/Ds.   Exercise: occ goes to the [facility].   Currently living in [place], [place].   Current Outpatient Medications on File Prior to Visit  Medication Sig Dispense Refill  . aspirin 81 MG tablet Take 81 mg by mouth daily.    Marland Kitchen atorvastatin (LIPITOR) 80 MG tablet TAKE 1 TABLET BY MOUTH EVERY DAY 90 tablet 0  . busPIRone (BUSPAR) 7.5 MG tablet 1/2 tab po bid 90 tablet 1  .  butalbital-acetaminophen-caffeine (FIORICET, ESGIC) 50-325-40 MG tablet take 1 to 2 tablets by mouth twice a day if needed for headache 60 tablet 1  . canagliflozin (INVOKANA) 100 MG TABS tablet TAKE 1 TABLET BY MOUTH DAILY BEFORE THE FIRST MEAL OF THE DAY 30 tablet 11  . DULoxetine (CYMBALTA) 60 MG capsule TAKE 1 CAPSULE BY MOUTH EVERY DAY 30 capsule 5  . glipiZIDE (GLUCOTROL XL) 5 MG 24 hr tablet TAKE 2 TABLETS BY MOUTH ONCE DAILY WITH BREAKFAST 60 tablet 6  . glucose blood (ONETOUCH VERIO) test strip Use as instructed to check sugar 2 times daily 200 each 5  . hydrochlorothiazide (HYDRODIURIL) 25 MG tablet TAKE 1 TABLET BY MOUTH EVERY DAY 30 tablet 5  . HYDROcodone-acetaminophen (NORCO/VICODIN) 5-325 MG tablet TAKE 1 TO 2 TABLETS EVERY 6 HOURS AS NEEDED FOR PAIN 120 tablet 0  . hydrocortisone (PROCTOSOL HC) 2.5 % rectal cream apply rectally twice a day 30 g 6  . Insulin Glargine (LANTUS SOLOSTAR) 100 UNIT/ML Solostar Pen Inject 45 units at night under skin 15 pen 3  . Insulin Pen Needle 31G X 4 MM MISC 1 each by Does not apply route at bedtime. 100 each 5  . lamoTRIgine (LAMICTAL) 200 MG tablet TAKE 1 TABLET BY MOUTH EVERY DAY 30 tablet 6  . Lancet Devices (ONE TOUCH DELICA LANCING DEV) MISC Use to check sugar 2 times daily 200 each 5  . lisinopril (PRINIVIL,ZESTRIL) 40 MG tablet TAKE 1 TABLET BY MOUTH EVERY DAY 90 tablet 1  . LORazepam (ATIVAN) 0.5 MG tablet 2 tabs po qhs 60 tablet 5  . metFORMIN (GLUCOPHAGE) 1000 MG tablet TAKE 1 TABLET BY MOUTH TWICE A DAY FOR DIABETES 180 tablet 1  . metoprolol tartrate (LOPRESSOR) 25 MG tablet Take 1 tablet (25 mg total) by mouth 2 (two) times daily. 180 tablet 3  . Multiple Vitamin (MULTIVITAMIN)  tablet Take 1 tablet by mouth daily.    . Multiple Vitamins-Minerals (PRESERVISION AREDS) CAPS Take 1 capsule by mouth daily.    . Omega-3 Fatty Acids (FISH OIL) 1000 MG CAPS Take 1 capsule by mouth at bedtime.    Marland Kitchen omeprazole (PRILOSEC) 40 MG capsule TAKE ONE  CAPSULE BY MOUTH EVERY DAY 30 capsule 5  . oxybutynin (DITROPAN-XL) 10 MG 24 hr tablet 1 tab po qd 90 tablet 3  . promethazine (PHENERGAN) 12.5 MG tablet 1-2 tabs po q6h prn nausea/vomiting 20 tablet 3  . Semaglutide (OZEMPIC) 0.25 or 0.5 MG/DOSE SOPN Inject 0.5 mg into the skin once a week. 2 pen 5   No current facility-administered medications on file prior to visit.    Allergies  Allergen Reactions  . Levofloxacin In D5w Hives  . Penicillins     Blistery rash  . Victoza [Liraglutide] Nausea Only   Family History  Problem Relation Age of Onset  . Cancer Mother        breast and follicular thyroid cancer  . Depression Mother   . Breast cancer Mother   . Diabetes Father   . Alcohol abuse Sister   . Hypertension Maternal Grandmother   . Hyperlipidemia Maternal Grandmother   . Heart disease Maternal Grandmother   . Stroke Maternal Grandmother   . Cancer Maternal Grandfather        colon cancer  . Stroke Maternal Grandfather   . Hypertension Maternal Grandfather   . Hyperlipidemia Maternal Grandfather   . Heart disease Maternal Grandfather    PE: LMP 06/04/2013  There is no height or weight on file to calculate BMI. Wt Readings from Last 3 Encounters:  01/18/18 241 lb 6 oz (109.5 kg)  11/06/17 245 lb 12.8 oz (111.5 kg)  08/06/17 253 lb 4 oz (114.9 kg)   Constitutional: overweight, in NAD Eyes: PERRLA, EOMI, no exophthalmos ENT: moist mucous membranes, + right thyroid fullness and isthmic nodule palpable, no cervical lymphadenopathy Cardiovascular: RRR, No MRG Respiratory: CTA B Gastrointestinal: abdomen soft, NT, ND, BS+ Musculoskeletal: no deformities, strength intact in all 4 Skin: moist, warm, no rashes Neurological: no tremor with outstretched hands, DTR normal in all 4  Assessment: 1. DM2, insulin-dep., uncontrolled  2. MNG - thyroid U/S (04/20/2014):   Adequacy Reason Satisfactory For Evaluation. Diagnosis THYROID, FINE NEEDLE ASPIRATION ISTHMUS,  (SPECIMEN 1 OF 4, COLLECTED ON 06/29/2014) ATYPIA OF UNDETERMINED SIGNIFICANCE OR FOLLICULAR LESION OF UNDETERMINED SIGNIFICANCE (BETHESDA CATEGORY III). SEE COMMENT. COMMENT: THE SPECIMEN CONSISTS OF SMALL AND MEDIUM SIZED GROUPS OF FOLLICULAR EPITHELIAL CELLS AND SOME BACKGROUND COLLOID. SOME OF THE GROUPS OF CELLS ARE ARRANGED AS MICROFOLLICLES. THERE ARE SCATTERED INTRANUCLEAR GROOVES. BASED ON THESE FEATURES, A FOLLICULAR LESION/NEOPLASM CAN NOT BE ENTIRELY RULED OUT. Pecola Leisure MD Pathologist, Electronic Signature (Case signed 06/30/2014) Specimen Clinical Information Solid 2.7 x 1.6 x 2.3cm heterogeneous nodule Source Thyroid, Fine Needle Aspiration, Isthmus, (Specimen 1 of 4, collected on 06/29/2014)   Adequacy Reason Satisfactory For Evaluation. Diagnosis THYROID, FINE NEEDLE ASPIRATION (SPECIMEN 2 OF 4 COLLECTED 06-29-2014) ATYPIA OF UNDETERMINED SIGNIFICANCE OR FOLLICULAR LESION OF UNDETERMINED SIGNIFICANCE (BETHESDA CATEGORY III). SEE COMMENT. COMMENT: THE SPECIMEN IS SOMEWHAT HYPOCELLULAR, HINDERING OPTIMAL CYTOLOGIC EVALUATION. HOWEVER, THERE ARE SCATTERED SMALL GROUPS OF FOLLICULAR EPITHELIAL CELLS WITH MILD CYTOLOGIC ATYPIA, INCLUDING CONSPICUOUS INTRANUCLEAR GROOVES. BASED ON THESE FEATURES, A FOLLICULAR LESION/NEOPLASM CAN NOT BE ENTIRELY RULED OUT. Pecola Leisure MD Pathologist, Electronic Signature (Case signed 06/30/2014) Specimen Clinical Information Right interpolar region nodule measures 3.0 x 1.9 x 2.8cm, It  is predominantely solid with a small cystic area Source Thyroid, Fine Needle Aspiration, Right, (Specimen 2 of 4, collected on 06/29/2014)   Adequacy Reason Satisfactory For Evaluation. Diagnosis FINE NEEDLE ASPIRATION, THYROID, LLP (SPECIMEN 3 OF 4 COLLECTED ON 06/29/14): CONSISTENT WITH BENIGN FOLLICULAR NODULE (BETHESDA CATEGORY II). Pecola LeisureJOSHUA KISH MD Pathologist, Electronic Signature (Case signed 06/30/2014) Specimen Clinical Information Smaller  llp nodule 1.5 x 1.4 x 1.3cm Source Thyroid, Fine Needle Aspiration, LLP, (Specimen 3 of 4, collected on 06/29/2014)   Adequacy Reason Satisfactory But Limited For Evaluation, Scant Cellularity. Diagnosis THYROID, FINE NEEDLE ASPIRATION: LEFT INTERPOLAR (4 OF 4 COLLECTED ON 06/29/2014) SCANT FOLLICULAR EPITHELIUM PRESENT (BETHESDA CATEGORY I). FINDINGS CONSISTENT WITH THE CONTENTS OF A CYST. Pecola LeisureJOSHUA KISH MD Pathologist, Electronic Signature (Case signed 06/30/2014) Specimen Clinical Information 2.8 x 2.2 x 2.3cm interpolar region solid heterogeneous nodule Source Thyroid, Fine Needle Aspiration, Left Interpolar, (Specimen 4 of 4, collected on 06/29/2014)  (07/27/2015): Adequacy Reason Satisfactory For Evaluation. Diagnosis THYROID, FINE NEEDLE ASPIRATION RIGHT MID POLE, (SPECIMEN 1 OF 2 COLLECTED 07/26/2015) CONSISTENT WITH BENIGN FOLLICULAR NODULE (BETHESDA CATEGORY II). Pecola LeisureJOSHUA KISH MD Pathologist, Electronic Signature (Case signed 07/27/2015) Specimen Clinical Information Previous biospy 06/29/14, Dominant 33 x 25 x 31 mm mostly solid nodule, right mid lobe (previously 30 x 19 x 28) Source Thyroid, Fine Needle Aspiration, RMP, (Specimen 1 of 2, collected on 07/26/2015)  Adequacy Reason Satisfactory For Evaluation. Diagnosis THYROID, FINE NEEDLE ASPIRATION, RIGHT ISTHMUS (SPECIMEN 2 OF 2 COLLECTED 07-26-2015) ATYPIA OF UNDETERMINED SIGNIFICANCE OR FOLLICULAR LESION OF UNDETERMINED SIGNIFICANCE (BETHESDA CATEGORY III). SEE COMMENT. COMMENT: THE SPECIMEN IS MILDLY CELLULAR AND CONSISTS OF SMALL AND MEDIUM SIZED GROUPS OF FOLLICULAR EPITHELIAL CELLS WITH MINIMAL CYTOLOGIC ATYPIA INCLUDING NUCLEAR OVERLAPPING. A SAMPLE WILL BE SENT FOR AFFIRMA TESTING AND THE RESULTS REPORTED SEPARATELY. Pecola LeisureJOSHUA KISH MD Pathologist, Electronic Signature (Case signed 07/27/2015) Specimen Clinical Information Dominant 27 x 17 x 23 mm slightly echogenic solid nodule, right of midline (previously 27 x 16  x 23) Source Thyroid, Fine Needle Aspiration, Right Isthmus, (Specimen 2 of 2, collected on 07/26/2015)  The R thyroid nodule is benign.  The isthmic nodule is still a AUS/FLUS, but Afirma result is BENIGN. (MTC negative).   Thyroid ultrasound (11/26/2017): Thyroid nodules are stable or smaller: CLINICAL DATA: Follow-up thyroid nodules. Four thyroid nodules were biopsied on 06/29/2014. Repeat biopsy of the isthmus and right thyroid nodule on 07/23/2017.  EXAM: THYROID ULTRASOUND  TECHNIQUE: Ultrasound examination of the thyroid gland and adjacent soft tissues was performed.  COMPARISON: 07/02/2015  FINDINGS: Parenchymal Echotexture: Markedly heterogenous  Isthmus: 0.5 cm, previously 1.1 cm  Right lobe: 5.2 x 2.3 x 2.9 cm, previously 5.8 x 3.3 x 3.2 cm  Left lobe: 5.2 x 2.0 x 2.3 cm, previously 5.9 x 2.5 x 2.5 cm  _________________________________________________________  Estimated total number of nodules >/= 1 cm: 4  Number of spongiform nodules >/= 2 cm not described below (TR1): 0  Number of mixed cystic and solid nodules >/= 1.5 cm not described below (TR2): 0  _________________________________________________________  Previous biopsy of the isthmus nodule, right thyroid nodule and the 2 left thyroid nodules.  The isthmus nodule is heterogeneous and appears to have some peripheral calcifications. The isthmus nodule measures 2.5 x 1.3 x 2.0 cm and previously measured 2.7 x 1.7 x 2.3 cm.  Heterogeneous isoechoic nodule in the mid right thyroid lobe measures 3.2 x 2.0 x 2.9 cm and previously measured 3.3 x 2.5 x 3.1 cm. Cannot exclude a few macro calcifications within this  right thyroid nodule.  Heterogeneous isoechoic nodule in the mid left thyroid lobe measures 2.9 x 2.0 x 2.3 cm and previously measured 3.2 x 2.1 x 2.4 cm.  Slightly hypoechoic nodule in the inferior left thyroid lobe measures 1.4 x 1.4 x 1.5 cm and previously measured 1.4 x 1.2 x 1.4 cm.  Question macrocalcification along the anterior aspect of this nodule.  IMPRESSION: Multinodular goiter. The previously biopsied dominant nodules have minimally changed in size.  No new suspicious thyroid nodules.   Electronically Signed By: Richarda Overlie M.D. On: 11/26/2017 10:45   3. HL  PLAN:  1. DM2 - Patient with long-standing, uncontrolled, type 2 diabetes, on oral antidiabetic regimen, and basal insulin, to which we added Ozempic at last visit.  At last visit, she was not checking sugars and I strongly advised her to start.  And HbA1c at that time was higher, at 10.3%. - At this visit, sugars are slightly better, but she is still not checking frequently.  I strongly advised her to start checking every day. - today, HbA1c is 8.3% improved, but still above target, so we will go ahead and increase her Ozempic dose.  She is tolerating this well.  We will continue the rest of her regimen. - I advised her to: Patient Instructions  Please continue: - Invokana 100 mg in am - Metformin 1000 mg 2x a day with meals. - Glipizide XL 10 mg in am.   - Lantus 45 units at bedtime  Please increase Ozempic to 1 mg weekly.  Please call and schedule an eye appt with Dr. Randon Goldsmith: [facility]:  Dr. Jeni Salles MD ?  Address: [address] [place], [place], [place] [address]  [phone]  Please return in 3 months with your sugar log.    - continue checking sugars at different times of the day - check 1x a day, rotating checks - advised for yearly eye exams >> she is not UTD - Return to clinic in 3 mo with sugar log    2. MNG  -Patient with a history of multinodular goiter, with the right and isthmic thyroid nodules initially indeterminant of first biopsy.  On the second biopsy the right thyroid nodule was benign, while the isthmic nodule was again indeterminate.  The [facility] molecular marker test was benign, though.  In this case, the isthmic nodule is likely  benign -At last visit we ordered another thyroid ultrasound that showed stability of the nodules and some of them were slightly smaller.  -No neck compression symptoms -No intervention is needed for now  3. HL - Reviewed latest lipid panel from earlier this month: LDL at target, much improved compared to before, triglycerides slightly high while HDL is slightly low Lab Results  Component Value Date   CHOL 159 01/18/2018   HDL 34.00 (L) 01/18/2018   LDLCALC 88 01/18/2018   LDLDIRECT 194.0 01/30/2017   TRIG 184.0 (H) 01/18/2018   CHOLHDL 5 01/18/2018  - Continues Lipitor without side effects.  Carlus Pavlov, MD PhD [facility]

## 2018-02-08 LAB — POCT GLYCOSYLATED HEMOGLOBIN (HGB A1C): Hemoglobin A1C: 8.3 % — AB (ref 4.0–5.6)

## 2018-02-08 NOTE — Addendum Note (Signed)
Addended by: Yolande JollyLAWSON, Feliberto Stockley on: 02/08/2018 08:18 AM   Modules accepted: Orders

## 2018-02-17 ENCOUNTER — Other Ambulatory Visit: Payer: Self-pay | Admitting: Family Medicine

## 2018-02-22 ENCOUNTER — Telehealth: Payer: Self-pay | Admitting: Internal Medicine

## 2018-02-22 ENCOUNTER — Telehealth: Payer: Self-pay | Admitting: *Deleted

## 2018-02-22 NOTE — Telephone Encounter (Signed)
SW pt, to advise her that Dr. Milinda Cave is out of the office today and to see if Dr. Elvera Lennox would send in something for her.   She stated that Dr. Elvera Lennox would not send anything in.   Please advise. Thanks.

## 2018-02-22 NOTE — Telephone Encounter (Signed)
OK to call in generic Phenergan 12.5 mg tabs, 1 to 2Q6 hours PRN nausea, number 15, refill times one.

## 2018-02-22 NOTE — Telephone Encounter (Signed)
Please advise on below  

## 2018-02-22 NOTE — Telephone Encounter (Signed)
Pt is aware and she will call back in 2 weeks

## 2018-02-22 NOTE — Telephone Encounter (Signed)
Copied from CRM [id number]. Topic: General - Other >> Feb 22, 2018  2:23 PM Gaynelle Adu wrote: Reason for CRM:  patient is request a call back she on Semaglutide [facility]) 1 MG/DOSE SOPN  she stated she was advise to take a 2 week back, but she is requesting something be called in to settle her stomach. Her best contact number [phone]. Please advise

## 2018-02-22 NOTE — Telephone Encounter (Signed)
Pt is calling with diarrhea very bad, stomach is noisy and loud, she has a lot of gas, living off imodium right now. Doesn't hurt but stomach is bloated, she is wondering if the ozempic could cause this?  Please advise  Appetite is not effected

## 2018-02-22 NOTE — Telephone Encounter (Signed)
She has been on the Ozempic for few months now so I do not think is Ozempic per se but possibly the increase in dose at last visit.  I would advise her to stop the 1 mg and put it in the fridge, take a 2-week break and then possibly restart the 0.5 mg if she feels better.  Please advise her to call us in 2 weeks so we can send the lower dose to her pharmacy.

## 2018-02-23 ENCOUNTER — Telehealth: Payer: Self-pay | Admitting: Family Medicine

## 2018-02-23 MED ORDER — DICYCLOMINE HCL 10 MG PO CAPS: ORAL_CAPSULE | ORAL | 0 refills | Status: DC

## 2018-02-23 NOTE — Telephone Encounter (Signed)
OK: bentyl eRxd. If sx's not improving or if still requiring daily medication in 2d then come in for check.

## 2018-02-23 NOTE — Telephone Encounter (Signed)
Copied from CRM [id number]. Topic: Quick Communication - See Telephone Encounter >> Feb 23, 2018 10:45 AM Raquel Sarna wrote: Pt already has fendergin Hiper active bowel sounds - Pt is needing a call back

## 2018-02-23 NOTE — Telephone Encounter (Signed)
Left message for pt to call back.   Need to find out more form pt, symptoms?

## 2018-02-23 NOTE — Telephone Encounter (Signed)
Patient notified and verbalized understanding. 

## 2018-02-23 NOTE — Telephone Encounter (Signed)
Left detailed message on cell vm, okay per DPR.  

## 2018-02-23 NOTE — Telephone Encounter (Signed)
Patient returned call and stated that she is having diarrhea, abdominal bubbling and gas for past two weeks. She called the Endocrinologist due to thinking the increase in Ozempic is the cause. Patient to hold Ozempic for 2 weeks per Endo. Last dose was 02/17/18. Patient is taking Immodium which helps some, she has only had 1 BM today but still having the abdominal bubbling and gas. Patient inquiring about Bentyl.

## 2018-03-10 ENCOUNTER — Other Ambulatory Visit: Payer: Self-pay | Admitting: Internal Medicine

## 2018-03-10 ENCOUNTER — Other Ambulatory Visit: Payer: Self-pay | Admitting: Family Medicine

## 2018-03-17 ENCOUNTER — Other Ambulatory Visit: Payer: Self-pay | Admitting: Family Medicine

## 2018-03-22 ENCOUNTER — Telehealth: Payer: Self-pay | Admitting: Family Medicine

## 2018-03-22 MED ORDER — FLUCONAZOLE 150 MG PO TABS: ORAL_TABLET | ORAL | 0 refills | Status: DC

## 2018-03-22 NOTE — Telephone Encounter (Signed)
Please advise. Thanks.  

## 2018-03-22 NOTE — Telephone Encounter (Signed)
Copied from CRM [id number]. Topic: Quick Communication - Rx Refill/Question >> Mar 22, 2018 10:45 AM Baldo Daub L wrote: Medication:  Pt called and states that she has a diabetic yeast infection and wants to know if the doctor can call in something for her to clear it up.  Pt states she has had it for a couple of the week and can not get it under control with OTC medication.  Preferred Pharmacy (with phone number or street name): [id number] - [place], [place] - [address] [phone] (Phone) [phone] (Fax)  Agent: Please be advised that RX refills may take up to 3 business days. We ask that you follow-up with your pharmacy.

## 2018-03-22 NOTE — Telephone Encounter (Signed)
OK, a 7 day course of diflucan was eRx'd.

## 2018-03-22 NOTE — Telephone Encounter (Signed)
Patient advised of RX at pharmacy.  

## 2018-03-28 ENCOUNTER — Other Ambulatory Visit: Payer: Self-pay | Admitting: Family Medicine

## 2018-04-09 ENCOUNTER — Other Ambulatory Visit: Payer: Self-pay | Admitting: Family Medicine

## 2018-04-09 NOTE — Telephone Encounter (Signed)
I'm going to deny this RF.  If she has used 120 of these pills in the last 27 days, that is way too much. Taking too much of this med on a regular basis is going to cause her to have rebound headaches (it may already be doing this).  Needs to make appt to discuss alternative options for headache treatments.-thx

## 2018-04-09 NOTE — Telephone Encounter (Signed)
RF request for fioricet. Last OV 01/28/18 Next OV 04/21/18 Last RX 03/13/18 # 60 x 1 rf.  Please advise?

## 2018-04-12 ENCOUNTER — Other Ambulatory Visit: Payer: Self-pay | Admitting: Family Medicine

## 2018-04-12 MED ORDER — BUTALBITAL-APAP-CAFFEINE 50-325-40 MG PO TABS: ORAL_TABLET | ORAL | 1 refills | Status: DC

## 2018-04-12 NOTE — Telephone Encounter (Signed)
OK, in that case I'll eRx RFs.-thx

## 2018-04-12 NOTE — Telephone Encounter (Signed)
Patient advised.

## 2018-04-12 NOTE — Telephone Encounter (Signed)
Patient called back to speak to [facility].  Patient states that it has been a good while since the the medicine refilled please advise. Thanks

## 2018-04-12 NOTE — Telephone Encounter (Signed)
LMOM for pt to CB to discuss.  

## 2018-04-12 NOTE — Telephone Encounter (Addendum)
Contacted patient.  Her last RX was written in 03/13/2017 not 2019.  I apologize, I put the wrong year on the original request.  Please advise.

## 2018-04-21 ENCOUNTER — Encounter: Payer: Self-pay | Admitting: *Deleted

## 2018-04-21 ENCOUNTER — Ambulatory Visit: Payer: 59 | Admitting: Family Medicine

## 2018-04-21 ENCOUNTER — Encounter: Payer: Self-pay | Admitting: Family Medicine

## 2018-04-21 VITALS — BP 95/61 | HR 61 | Temp 97.9°F | Resp 16 | Ht 65.0 in | Wt 241.2 lb

## 2018-04-21 DIAGNOSIS — F411 Generalized anxiety disorder: Secondary | ICD-10-CM | POA: Diagnosis not present

## 2018-04-21 DIAGNOSIS — L720 Epidermal cyst: Secondary | ICD-10-CM | POA: Diagnosis not present

## 2018-04-21 DIAGNOSIS — M7551 Bursitis of right shoulder: Secondary | ICD-10-CM | POA: Diagnosis not present

## 2018-04-21 DIAGNOSIS — F3341 Major depressive disorder, recurrent, in partial remission: Secondary | ICD-10-CM

## 2018-04-21 DIAGNOSIS — I10 Essential (primary) hypertension: Secondary | ICD-10-CM | POA: Diagnosis not present

## 2018-04-21 DIAGNOSIS — I952 Hypotension due to drugs: Secondary | ICD-10-CM

## 2018-04-21 MED ORDER — HYDROCODONE-ACETAMINOPHEN 5-325 MG PO TABS: ORAL_TABLET | ORAL | 0 refills | Status: DC

## 2018-04-21 MED ORDER — METHYLPREDNISOLONE ACETATE 80 MG/ML IJ SUSP
80.0000 mg | Freq: Once | INTRAMUSCULAR | Status: AC
  Administered 2018-04-21: 80 mg via INTRAMUSCULAR

## 2018-04-21 MED ORDER — METOPROLOL SUCCINATE ER 25 MG PO TB24: 25.0000 mg | ORAL_TABLET | Freq: Every day | ORAL | 3 refills | Status: DC

## 2018-04-21 NOTE — Progress Notes (Signed)
OFFICE VISIT  04/21/2018   CC:  Chief Complaint  Patient presents with  . Follow-up    RCI, pt is not fasting.   . Lump    left underarm/breast area   HPI:    Patient is a 57 y.o. Caucasian female who presents for f/u HTN and chronic/recurrent depression and anxiety. She has DM 2, has an endocrinologist.  Also, has chronic pain syndrome: Indication for chronic opioid: chronic bilat LBP w/out radiculopathy Medication and dose: Vicodin 5/325, 1-2q6h prn # pills per month: 120 but the last time she filled this med was 01/2017. Last UDS date: 05/01/17 Opioid Treatment Agreement signed (Y/N): 01/30/17 Opioid Treatment Agreement last reviewed with patient: today [facility] reviewed this encounter (include red flags): today, no red flags.  Says low back is doing fine, takes vicodin rarely.   Has had 1 mo of R shoulder pain, onset gradually, she thinks mild overuse or strain from pulling on the lever to make her recliner go back.  She point to deltoid.  Hurts to press on it and also when sleeping on it.  No radiation of the pain.  Reaching out or up high can make it hurt as well.  Pain is nearly constant, mild usually, but sometimes gets intense briefly. No neck pain.  She has noted a bump in left underarm area for 8 mo, some white substance has come out of it a couple times. It is sore lately, drained some white fluid today. She stuck an insulin needle in it last night and got nothing but blood.  Mood/anxiety: still struggling with anhedonia, wanting to isolate, lack of motivation, wanting to sleep a lot when she is not working. Today is anniversary ofher sister's death.  Has some crying spells.  No SI or HI. No panic attacks.  She is in favor of continuing current med regimen.  HTN: no home bp monitoring.  Rare dizziness when she stands up, nothing prolonged. Review of bp today and in past visits reveal consistently low normal bp.   Past Medical History:  Diagnosis Date  . Abnormal  nuclear stress test 12/31/11   Low risk lexiscan, but left heart cath was recommended and this showed normal coronary arteries and normal LV function. (01/01/12)  . Anxiety    + ?mood disorder (awaiting old psych records as of 12/19/11.  . Borderline personality disorder (HCC)   . Carpal tunnel syndrome on both sides   . Chronic pain syndrome    low back pain  . Colon cancer screening    Pt did not return cologuard 10/2016.  She has chosen not to get colonoscopy as of 2018 f/u.  Pt did not return cologuard 09/2017.  Marland Kitchen Daytime somnolence    Nuvigil rx'd by her psychiatrist.  Pt reports that no sleep study has been done.  . DDD (degenerative disc disease), lumbar    Pt says she has hx of herniated disc that has resulted in some numbness in a few toes on left foot.  . 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  I have yet to see/hear any evidence of borderline personality d/o in her since she has been my pt.  . DM2 (diabetes mellitus, type 2) (HCC)    Managed by ENDO--Dr. Elvera Lennox.  +hx of GDM both pregnancies, dx'd with DM  2 in her 30s--denies hx of complications  with eyes, kidneys, nerves, or CV system.  . Hepatic steatosis   . History of vitamin D deficiency 2009  . Hyperlipidemia   . Hypertension   . Iron deficiency anemia 2013   Hx of: Likely occult upper GI bleeding.  Hb 8.6--came up to 12 at most recent check after getting on integra, which she now continues once daily.   iFOB was negative 01/13/12.  She says she had been taking excessive NSAIDs at the time.  She has never had a colonoscopy.  . Low back pain 12/19/2011  . Migraine syndrome   . Mixed urge and stress incontinence   . Multinodular goiter    Hx of multiple benign biopsies; repeated u/s's showed no change until the one on 04/2014.  FNA 06/2013 showed R  and isthmus nodules intermediate so f/u bx 6 mo recommended.  Repeat bx 07/26/15 showed R lobe benign, isthmus atypical cells of undetermined signif,--detailed path analysis was done and it was BENIGN.  Plan per endo is repeat u/s 1-2 yrs from date of most recent biopsy.-  . Obesity   . Plantar fasciitis    Steroid injection by podiatrist 09/2017  . Renal cyst, acquired, right 2017  . Toe fracture, left 09/2017   Distal phalanx L third toe (saw podiatrist)    Past Surgical History:  Procedure Laterality Date  . CARDIAC CATHETERIZATION  01/01/12   Normal coronaries and normal LV function.  Marland Kitchen CESAREAN SECTION     X 2  . FNA bx Thyroid nodules  06/29/14   x 4: some findings to support benign cyst and some "cellular atypica of indeterminate significance".--followed by Dr. Elvera Lennox.  Repeat bx 05/2015 BENIGN.    Outpatient Medications Prior to Visit  Medication Sig Dispense Refill  . aspirin 81 MG tablet Take 81 mg by mouth daily.    Marland Kitchen atorvastatin (LIPITOR) 80 MG tablet TAKE 1 TABLET BY MOUTH EVERY DAY 90 tablet 0  . busPIRone (BUSPAR) 5 MG tablet TAKE 1/2 TABLET THREE TIMES A DAY 90 tablet 0  . butalbital-acetaminophen-caffeine (FIORICET, ESGIC) 50-325-40 MG tablet take 1 to 2 tablets by mouth twice a day if needed for headache 60 tablet 1  . canagliflozin (INVOKANA) 100 MG TABS tablet TAKE 1 TABLET BY MOUTH DAILY BEFORE THE FIRST MEAL OF THE DAY 30 tablet 11  . dicyclomine (BENTYL) 10 MG capsule 1 tab po tid prn abd bloating/cramping 30 capsule 0  . DULoxetine (CYMBALTA) 60 MG capsule TAKE 1 CAPSULE BY MOUTH EVERY DAY 30 capsule 5  . glipiZIDE (GLUCOTROL XL) 5 MG 24 hr tablet TAKE 2 TABLETS BY MOUTH ONCE DAILY WITH BREAKFAST 60 tablet 6  . glucose blood (ONETOUCH VERIO) test strip Use as instructed to check sugar 2 times daily 200 each 5  . hydrochlorothiazide (HYDRODIURIL) 25 MG tablet TAKE 1 TABLET BY MOUTH EVERY DAY 30 tablet 5  . hydrocortisone (PROCTOSOL HC) 2.5 % rectal cream apply  rectally twice a day 30 g 6  . Insulin Glargine (LANTUS SOLOSTAR) 100 UNIT/ML Solostar Pen INJECT 45 UNITS INTO THE SKIN DAILY AT 10 PM. 15 pen 3  . Insulin Pen Needle 31G X 4 MM MISC 1 each by Does not apply route at bedtime. 100 each 5  . lamoTRIgine (LAMICTAL) 200 MG tablet TAKE 1 TABLET BY MOUTH EVERY DAY 30 tablet 6  . Lancet Devices (ONE TOUCH DELICA LANCING DEV) MISC Use to check sugar 2 times daily 200 each 5  .  lisinopril (PRINIVIL,ZESTRIL) 40 MG tablet TAKE 1 TABLET BY MOUTH EVERY DAY 90 tablet 1  . LORazepam (ATIVAN) 0.5 MG tablet TAKE 2 TABLETS BY MOUTH AT BEDTIME 60 tablet 5  . metFORMIN (GLUCOPHAGE) 1000 MG tablet TAKE 1 TABLET BY MOUTH TWICE A DAY FOR DIABETES 180 tablet 1  . Multiple Vitamin (MULTIVITAMIN) tablet Take 1 tablet by mouth daily.    . Multiple Vitamins-Minerals (PRESERVISION AREDS) CAPS Take 1 capsule by mouth daily.    . Omega-3 Fatty Acids (FISH OIL) 1000 MG CAPS Take 1 capsule by mouth at bedtime.    Marland Kitchen omeprazole (PRILOSEC) 40 MG capsule TAKE 1 CAPSULE BY MOUTH EVERY DAY 30 capsule 5  . oxybutynin (DITROPAN-XL) 10 MG 24 hr tablet TAKE 1 TABLET BY MOUTH EVERY DAY 30 tablet 5  . promethazine (PHENERGAN) 12.5 MG tablet 1-2 tabs po q6h prn nausea/vomiting 20 tablet 3  . HYDROcodone-acetaminophen (NORCO/VICODIN) 5-325 MG tablet TAKE 1 TO 2 TABLETS EVERY 6 HOURS AS NEEDED FOR PAIN 120 tablet 0  . metoprolol tartrate (LOPRESSOR) 25 MG tablet Take 1 tablet (25 mg total) by mouth 2 (two) times daily. 180 tablet 3  . busPIRone (BUSPAR) 7.5 MG tablet 1/2 tab po bid (Patient not taking: Reported on 04/21/2018) 90 tablet 1  . fluconazole (DIFLUCAN) 150 MG tablet 1 tab po qd x 7d (Patient not taking: Reported on 04/21/2018) 7 tablet 0  . Semaglutide (OZEMPIC) 1 MG/DOSE SOPN Inject 1 mg into the skin once a week. (Patient not taking: Reported on 04/21/2018) 2 pen 5   No facility-administered medications prior to visit.     Allergies  Allergen Reactions  . Levofloxacin In D5w  Hives  . Penicillins     Blistery rash  . Victoza [Liraglutide] Nausea Only    ROS As per HPI  PE: Blood pressure 95/61, pulse 61, temperature 97.9 F (36.6 C), temperature source Oral, resp. rate 16, height 5\' 5"  (1.651 m), weight 241 lb 4 oz (109.4 kg), last menstrual period 06/04/2013, SpO2 98 %. Body mass index is 40.15 kg/m. Pt examined with Pryor Ochoa, CMA, as chaperone.  Gen: Alert, well appearing.  Patient is oriented to person, place, time, and situation. AFFECT: pleasant, lucid thought and speech. R shoulder: mild TTP around acromion and posterior deltoid region.  AC joint and biceps tendon non tender. ABduction painful, esp in 30 deg to 120 deg arc, neg drop sign. Resisted IR painful, ER ok. Strength 5/5prox and dist bilat.  No rash.  Left axilla with marble sized subQ cystic nodule that is mildly tender,has a central drainage area that is sealed up. No fluctuance.  +Indurated subQ at the area.  No other nodule or mass.  LABS:  Lab Results  Component Value Date   TSH 1.34 01/18/2018   Lab Results  Component Value Date   WBC 4.8 01/18/2018   HGB 11.9 (L) 01/18/2018   HCT 35.8 (L) 01/18/2018   MCV 84.9 01/18/2018   PLT 255.0 01/18/2018   Lab Results  Component Value Date   CREATININE 0.82 01/18/2018   BUN 15 01/18/2018   NA 138 01/18/2018   K 3.8 01/18/2018   CL 101 01/18/2018   CO2 30 01/18/2018   Lab Results  Component Value Date   ALT 15 01/18/2018   AST 10 01/18/2018   ALKPHOS 95 01/18/2018   BILITOT 0.4 01/18/2018   Lab Results  Component Value Date   CHOL 159 01/18/2018   Lab Results  Component Value Date   HDL 34.00 (  L) 01/18/2018   Lab Results  Component Value Date   LDLCALC 88 01/18/2018   Lab Results  Component Value Date   TRIG 184.0 (H) 01/18/2018   Lab Results  Component Value Date   CHOLHDL 5 01/18/2018   Lab Results  Component Value Date   HGBA1C 8.3 (A) 02/08/2018    IMPRESSION AND PLAN:  1) HTN: bp a  bit low. Change metoprolol to extended release and decrease dose to 25mg  total qd. Continue hctz and lisinopril at current dosing. BMET today.  2) Chronic pain syndrome: she is using vicodin VERY infrequently. I did electronic rx for vicodin 5/325, 1-2 q6h prn, #120 today.   Controlled substance contract updated today. Will do UDS next f/u visit.  3) Chronic depression and anxiety: she is largely unchanged overall and wants to continue current med regimen.  4) Right subacromial bursitis: pt chose to get steroid injection today.  Procedure: Therapeutic RIGHT shoulder injection.  The patient's clinical condition is marked by substantial pain and/or significant functional disability.  Other conservative therapy has not provided relief, is contraindicated, or not appropriate.  There is a reasonable likelihood that injection will significantly improve the patient's pain and/or functional disability. Cleaned skin with alcohol swab, used posterolateral approach, Injected 1 ml of 80mg /ml + 2  ml of plain 1% lidocaine into subacromial space without resistance.  No immediate complications.  Patient tolerated procedure well.  Post-injection care discussed, including 20 min of icing 1-2 times in the next 4-8 hours, frequent non weight-bearing ROM exercises over the next few days, and general pain medication management.  5) Epidermal inclusion cyst, left axilla. No sign of infection.   Warm compress recommended. Signs/symptoms to call or return for were reviewed and pt expressed understanding.  An After Visit Summary was printed and given to the patient.  FOLLOW UP: Return in about 3 months (around 07/22/2018) for routine chronic illness f/u ( ).  Signed:  Santiago Bumpers, MD           04/21/2018

## 2018-04-22 LAB — BASIC METABOLIC PANEL
BUN: 13 mg/dL (ref 6–23)
CHLORIDE: 102 meq/L (ref 96–112)
CO2: 26 mEq/L (ref 19–32)
Calcium: 9.1 mg/dL (ref 8.4–10.5)
Creatinine, Ser: 0.73 mg/dL (ref 0.40–1.20)
GFR: 87.11 mL/min (ref >60.00)
Glucose, Bld: 279 mg/dL — ABNORMAL HIGH (ref 70–99)
POTASSIUM: 4 meq/L (ref 3.5–5.1)
Sodium: 137 mEq/L (ref 135–145)

## 2018-05-20 ENCOUNTER — Ambulatory Visit: Payer: 59 | Admitting: Internal Medicine

## 2018-06-03 ENCOUNTER — Telehealth: Payer: Self-pay | Admitting: Family Medicine

## 2018-06-03 MED ORDER — FLUCONAZOLE 150 MG PO TABS: ORAL_TABLET | ORAL | 1 refills | Status: DC

## 2018-06-03 NOTE — Telephone Encounter (Signed)
Diflucan eRx'd 

## 2018-06-03 NOTE — Telephone Encounter (Signed)
Pt advised and voiced understanding.   

## 2018-06-03 NOTE — Telephone Encounter (Signed)
Please advise. Thanks.  

## 2018-06-03 NOTE — Telephone Encounter (Signed)
Copied from CRM [phone]. Topic: Quick Communication - See Telephone Encounter >> Jun 03, 2018  1:08 PM Lorrine KinMcGee, [place]ano Blasco B, [place] wrote: CRM for notification. See Telephone encounter for: 06/03/18. Patient calling and states that she is having vaginal itching, burning and it's almost to the point of bleeding. Advised that he would likely need to see her in the office and she states "he has called in the medication before since I am diabetic." Please advise. [id number] [place] [address] CB#: [phone]

## 2018-07-22 ENCOUNTER — Ambulatory Visit: Payer: 59 | Admitting: Family Medicine

## 2018-07-22 ENCOUNTER — Encounter: Payer: Self-pay | Admitting: Family Medicine

## 2018-07-22 NOTE — Progress Notes (Deleted)
OFFICE VISIT  07/22/2018   CC: No chief complaint on file.    HPI:    Patient is a 58 y.o. Caucasian female who presents for 4 mo f/u HTN, MDD-recurrent, GAD.  Past Medical History:  Diagnosis Date  . Abnormal nuclear stress test 12/31/11   Low risk lexiscan, but left heart cath was recommended and this showed normal coronary arteries and normal LV function. (01/01/12)  . Anxiety    + ?mood disorder (awaiting old psych records as of 12/19/11.  . Borderline personality disorder (HCC)   . Carpal tunnel syndrome on both sides   . Chronic pain syndrome    low back pain  . Colon cancer screening    Pt did not return cologuard 10/2016.  She has chosen not to get colonoscopy as of 2018 f/u.  Pt did not return cologuard 09/2017.  Marland Kitchen. Daytime somnolence    Nuvigil rx'd by her psychiatrist.  Pt reports that no sleep study has been done.  . DDD (degenerative disc disease), lumbar    Pt says she has hx of herniated disc that has resulted in some numbness in a few toes on left foot.  . 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  I have yet to see/hear any evidence of borderline personality d/o in her since she has been my pt.  . DM2 (diabetes mellitus, type 2) (HCC)    Managed by ENDO--Dr. [name].  +hx of GDM both pregnancies, dx'd with DM 2 in her 30s--denies hx of complications  with eyes, kidneys, nerves, or CV system.  . Hepatic steatosis   . History of vitamin D deficiency 2009  . Hyperlipidemia   . Hypertension   . Iron deficiency anemia 2013   Hx of: Likely occult upper GI bleeding.  Hb 8.6--came up to 12 at most recent check after getting on integra, which she now continues once daily.   iFOB was negative 01/13/12.  She says she had been taking excessive NSAIDs at the time.  She has never had a colonoscopy.  .  Low back pain 12/19/2011  . Migraine syndrome   . Mixed urge and stress incontinence   . Multinodular goiter    Hx of multiple benign biopsies; repeated u/s's showed no change until the one on 04/2014.  FNA 06/2013 showed R and isthmus nodules intermediate so f/u bx 6 mo recommended.  Repeat bx 07/26/15 showed R lobe benign, isthmus atypical cells of undetermined signif,--detailed path analysis was done and it was BENIGN.  Plan per endo is repeat u/s 1-2 yrs from date of most recent biopsy.-  . Obesity   . Plantar fasciitis    Steroid injection by podiatrist 09/2017  . Renal cyst, acquired, right 2017  . Toe fracture, left 09/2017   Distal phalanx L third toe (saw podiatrist)    Past Surgical History:  Procedure Laterality Date  . CARDIAC CATHETERIZATION  01/01/12   Normal coronaries and normal LV function.  Marland Kitchen. CESAREAN SECTION     X 2  . FNA bx Thyroid nodules  06/29/14   x 4: some findings to support benign cyst and some "cellular atypica of indeterminate significance".--followed by Dr. [name].  Repeat bx 05/2015 BENIGN.    Outpatient Medications Prior to Visit  Medication Sig Dispense Refill  . aspirin 81 MG  tablet Take 81 mg by mouth daily.    Marland Kitchen atorvastatin (LIPITOR) 80 MG tablet TAKE 1 TABLET BY MOUTH EVERY DAY 90 tablet 0  . busPIRone (BUSPAR) 5 MG tablet TAKE 1/2 TABLET THREE TIMES A DAY 90 tablet 0  . butalbital-acetaminophen-caffeine (FIORICET, ESGIC) 50-325-40 MG tablet take 1 to 2 tablets by mouth twice a day if needed for headache 60 tablet 1  . canagliflozin (INVOKANA) 100 MG TABS tablet TAKE 1 TABLET BY MOUTH DAILY BEFORE THE FIRST MEAL OF THE DAY 30 tablet 11  . dicyclomine (BENTYL) 10 MG capsule 1 tab po tid prn abd bloating/cramping 30 capsule 0  . DULoxetine (CYMBALTA) 60 MG capsule TAKE 1 CAPSULE BY MOUTH EVERY DAY 30 capsule 5  . fluconazole (DIFLUCAN) 150 MG tablet 1 tab po qd x 3d 3 tablet 1  . glipiZIDE (GLUCOTROL XL) 5 MG 24 hr tablet TAKE 2 TABLETS BY MOUTH ONCE  DAILY WITH BREAKFAST 60 tablet 6  . glucose blood (ONETOUCH VERIO) test strip Use as instructed to check sugar 2 times daily 200 each 5  . hydrochlorothiazide (HYDRODIURIL) 25 MG tablet TAKE 1 TABLET BY MOUTH EVERY DAY 30 tablet 5  . HYDROcodone-acetaminophen (NORCO/VICODIN) 5-325 MG tablet TAKE 1 TO 2 TABLETS EVERY 6 HOURS AS NEEDED FOR PAIN 120 tablet 0  . hydrocortisone (PROCTOSOL HC) 2.5 % rectal cream apply rectally twice a day 30 g 6  . Insulin Glargine (LANTUS SOLOSTAR) 100 UNIT/ML Solostar Pen INJECT 45 UNITS INTO THE SKIN DAILY AT 10 PM. 15 pen 3  . Insulin Pen Needle 31G X 4 MM MISC 1 each by Does not apply route at bedtime. 100 each 5  . lamoTRIgine (LAMICTAL) 200 MG tablet TAKE 1 TABLET BY MOUTH EVERY DAY 30 tablet 6  . Lancet Devices (ONE TOUCH DELICA LANCING DEV) MISC Use to check sugar 2 times daily 200 each 5  . lisinopril (PRINIVIL,ZESTRIL) 40 MG tablet TAKE 1 TABLET BY MOUTH EVERY DAY 90 tablet 1  . LORazepam (ATIVAN) 0.5 MG tablet TAKE 2 TABLETS BY MOUTH AT BEDTIME 60 tablet 5  . metFORMIN (GLUCOPHAGE) 1000 MG tablet TAKE 1 TABLET BY MOUTH TWICE A DAY FOR DIABETES 180 tablet 1  . metoprolol succinate (TOPROL-XL) 25 MG 24 hr tablet Take 1 tablet (25 mg total) by mouth daily. 30 tablet 3  . Multiple Vitamin (MULTIVITAMIN) tablet Take 1 tablet by mouth daily.    . Multiple Vitamins-Minerals (PRESERVISION AREDS) CAPS Take 1 capsule by mouth daily.    . Omega-3 Fatty Acids (FISH OIL) 1000 MG CAPS Take 1 capsule by mouth at bedtime.    Marland Kitchen omeprazole (PRILOSEC) 40 MG capsule TAKE 1 CAPSULE BY MOUTH EVERY DAY 30 capsule 5  . oxybutynin (DITROPAN-XL) 10 MG 24 hr tablet TAKE 1 TABLET BY MOUTH EVERY DAY 30 tablet 5  . promethazine (PHENERGAN) 12.5 MG tablet 1-2 tabs po q6h prn nausea/vomiting 20 tablet 3   No facility-administered medications prior to visit.     Allergies  Allergen Reactions  . Levofloxacin In D5w Hives  . Penicillins     Blistery rash  . Victoza [Liraglutide]  Nausea Only    ROS As per HPI  PE: Last menstrual period 06/04/2013. ***  LABS:  Lab Results  Component Value Date   TSH 1.34 01/18/2018   Lab Results  Component Value Date   WBC 4.8 01/18/2018   HGB 11.9 (L) 01/18/2018   HCT 35.8 (L) 01/18/2018   MCV 84.9 01/18/2018   PLT 255.0 01/18/2018  Lab Results  Component Value Date   CREATININE 0.73 04/21/2018   BUN 13 04/21/2018   NA 137 04/21/2018   K 4.0 04/21/2018   CL 102 04/21/2018   CO2 26 04/21/2018   Lab Results  Component Value Date   ALT 15 01/18/2018   AST 10 01/18/2018   ALKPHOS 95 01/18/2018   BILITOT 0.4 01/18/2018   Lab Results  Component Value Date   CHOL 159 01/18/2018   Lab Results  Component Value Date   HDL 34.00 (L) 01/18/2018   Lab Results  Component Value Date   LDLCALC 88 01/18/2018   Lab Results  Component Value Date   TRIG 184.0 (H) 01/18/2018   Lab Results  Component Value Date   CHOLHDL 5 01/18/2018   Lab Results  Component Value Date   HGBA1C 8.3 (A) 02/08/2018    IMPRESSION AND PLAN:  No problem-specific Assessment & Plan notes found for this encounter.   An After Visit Summary was printed and given to the patient.  FOLLOW UP: No follow-ups on file.  Signed:  Santiago Bumpers, MD           07/22/2018

## 2018-07-27 ENCOUNTER — Ambulatory Visit: Payer: 59 | Admitting: Family Medicine

## 2018-07-27 NOTE — Progress Notes (Deleted)
OFFICE VISIT  07/27/2018   CC: No chief complaint on file.   HPI:    Patient is a 58 y.o. Caucasian female who presents for 3 mo f/u HTN, MDD in partial remission, and GAD.  Past Medical History:  Diagnosis Date  . Abnormal nuclear stress test 12/31/11   Low risk lexiscan, but left heart cath was recommended and this showed normal coronary arteries and normal LV function. (01/01/12)  . Anxiety    + ?mood disorder (awaiting old psych records as of 12/19/11.  . Carpal tunnel syndrome on both sides   . Chronic pain syndrome    low back pain  . Colon cancer screening    Pt did not return cologuard 10/2016.  She has chosen not to get colonoscopy as of 2018 f/u.  Pt did not return cologuard 09/2017.  Marland Kitchen Daytime somnolence    Nuvigil rx'd by her psychiatrist.  Pt reports that no sleep study has been done.  . DDD (degenerative disc disease), lumbar    Pt says she has hx of herniated disc that has resulted in some numbness in a few toes on left foot.  . 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  I have yet to see/hear any evidence of borderline personality d/o in her since she has been my pt.  . DM2 (diabetes mellitus, type 2) (HCC)    Managed by ENDO--Dr. Elvera Lennox.  +hx of GDM both pregnancies, dx'd with DM 2 in her 30s--denies hx of complications  with eyes, kidneys, nerves, or CV system.  . Hepatic steatosis   . History of vitamin D deficiency 2009  . Hyperlipidemia   . Hypertension   . Iron deficiency anemia 2013   Hx of: Likely occult upper GI bleeding.  Hb 8.6--came up to 12 at most recent check after getting on integra, which she now continues once daily.   iFOB was negative 01/13/12.  She says she had been taking excessive NSAIDs at the time.  She has never had a colonoscopy.  . Low back pain 12/19/2011  .  Migraine syndrome   . Mixed urge and stress incontinence   . Multinodular goiter    Hx of multiple benign biopsies; repeated u/s's showed no change until the one on 04/2014.  FNA 06/2013 showed R and isthmus nodules intermediate so f/u bx 6 mo recommended.  Repeat bx 07/26/15 showed R lobe benign, isthmus atypical cells of undetermined signif,--detailed path analysis was done and it was BENIGN.  U/S 11/2017 -->NO CHANGE in nodules.  . Obesity   . Plantar fasciitis    Steroid injection by podiatrist 09/2017  . Renal cyst, acquired, right 2017  . Toe fracture, left 09/2017   Distal phalanx L third toe (saw podiatrist)    Past Surgical History:  Procedure Laterality Date  . CARDIAC CATHETERIZATION  01/01/12   Normal coronaries and normal LV function.  Marland Kitchen CESAREAN SECTION     X 2  . FNA bx Thyroid nodules  06/29/14   x 4: some findings to support benign cyst and some "cellular atypica of indeterminate significance".--followed by Dr. Elvera Lennox.  Repeat bx 05/2015 BENIGN.    Outpatient Medications Prior to Visit  Medication Sig Dispense Refill  . aspirin 81 MG tablet Take 81 mg by mouth daily.    Marland Kitchen atorvastatin (  LIPITOR) 80 MG tablet TAKE 1 TABLET BY MOUTH EVERY DAY 90 tablet 0  . busPIRone (BUSPAR) 5 MG tablet TAKE 1/2 TABLET THREE TIMES A DAY 90 tablet 0  . butalbital-acetaminophen-caffeine (FIORICET, ESGIC) 50-325-40 MG tablet take 1 to 2 tablets by mouth twice a day if needed for headache 60 tablet 1  . canagliflozin (INVOKANA) 100 MG TABS tablet TAKE 1 TABLET BY MOUTH DAILY BEFORE THE FIRST MEAL OF THE DAY 30 tablet 11  . dicyclomine (BENTYL) 10 MG capsule 1 tab po tid prn abd bloating/cramping 30 capsule 0  . DULoxetine (CYMBALTA) 60 MG capsule TAKE 1 CAPSULE BY MOUTH EVERY DAY 30 capsule 5  . fluconazole (DIFLUCAN) 150 MG tablet 1 tab po qd x 3d 3 tablet 1  . glipiZIDE (GLUCOTROL XL) 5 MG 24 hr tablet TAKE 2 TABLETS BY MOUTH ONCE DAILY WITH BREAKFAST 60 tablet 6  . glucose blood (ONETOUCH  VERIO) test strip Use as instructed to check sugar 2 times daily 200 each 5  . hydrochlorothiazide (HYDRODIURIL) 25 MG tablet TAKE 1 TABLET BY MOUTH EVERY DAY 30 tablet 5  . HYDROcodone-acetaminophen (NORCO/VICODIN) 5-325 MG tablet TAKE 1 TO 2 TABLETS EVERY 6 HOURS AS NEEDED FOR PAIN 120 tablet 0  . hydrocortisone (PROCTOSOL HC) 2.5 % rectal cream apply rectally twice a day 30 g 6  . Insulin Glargine (LANTUS SOLOSTAR) 100 UNIT/ML Solostar Pen INJECT 45 UNITS INTO THE SKIN DAILY AT 10 PM. 15 pen 3  . Insulin Pen Needle 31G X 4 MM MISC 1 each by Does not apply route at bedtime. 100 each 5  . lamoTRIgine (LAMICTAL) 200 MG tablet TAKE 1 TABLET BY MOUTH EVERY DAY 30 tablet 6  . Lancet Devices (ONE TOUCH DELICA LANCING DEV) MISC Use to check sugar 2 times daily 200 each 5  . lisinopril (PRINIVIL,ZESTRIL) 40 MG tablet TAKE 1 TABLET BY MOUTH EVERY DAY 90 tablet 1  . LORazepam (ATIVAN) 0.5 MG tablet TAKE 2 TABLETS BY MOUTH AT BEDTIME 60 tablet 5  . metFORMIN (GLUCOPHAGE) 1000 MG tablet TAKE 1 TABLET BY MOUTH TWICE A DAY FOR DIABETES 180 tablet 1  . metoprolol succinate (TOPROL-XL) 25 MG 24 hr tablet Take 1 tablet (25 mg total) by mouth daily. 30 tablet 3  . Multiple Vitamin (MULTIVITAMIN) tablet Take 1 tablet by mouth daily.    . Multiple Vitamins-Minerals (PRESERVISION AREDS) CAPS Take 1 capsule by mouth daily.    . Omega-3 Fatty Acids (FISH OIL) 1000 MG CAPS Take 1 capsule by mouth at bedtime.    Marland Kitchen. omeprazole (PRILOSEC) 40 MG capsule TAKE 1 CAPSULE BY MOUTH EVERY DAY 30 capsule 5  . oxybutynin (DITROPAN-XL) 10 MG 24 hr tablet TAKE 1 TABLET BY MOUTH EVERY DAY 30 tablet 5  . promethazine (PHENERGAN) 12.5 MG tablet 1-2 tabs po q6h prn nausea/vomiting 20 tablet 3   No facility-administered medications prior to visit.     Allergies  Allergen Reactions  . Levofloxacin In D5w Hives  . Penicillins     Blistery rash  . Victoza [Liraglutide] Nausea Only    ROS As per HPI  PE: Last menstrual period  06/04/2013. ***  LABS:  Lab Results  Component Value Date   TSH 1.34 01/18/2018   Lab Results  Component Value Date   WBC 4.8 01/18/2018   HGB 11.9 (L) 01/18/2018   HCT 35.8 (L) 01/18/2018   MCV 84.9 01/18/2018   PLT 255.0 01/18/2018   Lab Results  Component Value Date   CREATININE 0.73  04/21/2018   BUN 13 04/21/2018   NA 137 04/21/2018   K 4.0 04/21/2018   CL 102 04/21/2018   CO2 26 04/21/2018   Lab Results  Component Value Date   ALT 15 01/18/2018   AST 10 01/18/2018   ALKPHOS 95 01/18/2018   BILITOT 0.4 01/18/2018   Lab Results  Component Value Date   CHOL 159 01/18/2018   Lab Results  Component Value Date   HDL 34.00 (L) 01/18/2018   Lab Results  Component Value Date   LDLCALC 88 01/18/2018   Lab Results  Component Value Date   TRIG 184.0 (H) 01/18/2018   Lab Results  Component Value Date   CHOLHDL 5 01/18/2018   Lab Results  Component Value Date   HGBA1C 8.3 (A) 02/08/2018    IMPRESSION AND PLAN:  No problem-specific Assessment & Plan notes found for this encounter.   An After Visit Summary was printed and given to the patient.  FOLLOW UP: No follow-ups on file.  Signed:  Santiago BumpersPhil Rhen Kawecki, MD           07/27/2018

## 2018-08-05 ENCOUNTER — Ambulatory Visit: Payer: 59 | Admitting: Family Medicine

## 2018-08-05 ENCOUNTER — Encounter: Payer: Self-pay | Admitting: Family Medicine

## 2018-08-05 VITALS — BP 100/64 | HR 74 | Temp 98.0°F | Resp 16 | Ht 65.0 in | Wt 238.2 lb

## 2018-08-05 DIAGNOSIS — Z79899 Other long term (current) drug therapy: Secondary | ICD-10-CM | POA: Diagnosis not present

## 2018-08-05 DIAGNOSIS — G8929 Other chronic pain: Secondary | ICD-10-CM | POA: Diagnosis not present

## 2018-08-05 DIAGNOSIS — M545 Low back pain: Secondary | ICD-10-CM

## 2018-08-05 DIAGNOSIS — I10 Essential (primary) hypertension: Secondary | ICD-10-CM

## 2018-08-05 DIAGNOSIS — M25511 Pain in right shoulder: Secondary | ICD-10-CM | POA: Diagnosis not present

## 2018-08-05 DIAGNOSIS — M7551 Bursitis of right shoulder: Secondary | ICD-10-CM | POA: Diagnosis not present

## 2018-08-05 DIAGNOSIS — G894 Chronic pain syndrome: Secondary | ICD-10-CM | POA: Diagnosis not present

## 2018-08-05 DIAGNOSIS — Z23 Encounter for immunization: Secondary | ICD-10-CM | POA: Diagnosis not present

## 2018-08-05 DIAGNOSIS — M7521 Bicipital tendinitis, right shoulder: Secondary | ICD-10-CM

## 2018-08-05 LAB — BASIC METABOLIC PANEL
BUN: 24 mg/dL — AB (ref 6–23)
CHLORIDE: 101 meq/L (ref 96–112)
CO2: 26 mEq/L (ref 19–32)
CREATININE: 0.84 mg/dL (ref 0.40–1.20)
Calcium: 8.9 mg/dL (ref 8.4–10.5)
GFR: 69.63 mL/min (ref >60.00)
GLUCOSE: 222 mg/dL — AB (ref 70–99)
Potassium: 4.1 mEq/L (ref 3.5–5.1)
Sodium: 135 mEq/L (ref 135–145)

## 2018-08-05 MED ORDER — METHYLPREDNISOLONE ACETATE 40 MG/ML IJ SUSP
40.0000 mg | Freq: Once | INTRAMUSCULAR | Status: AC
  Administered 2018-08-05: 40 mg via INTRAMUSCULAR

## 2018-08-05 NOTE — Addendum Note (Signed)
Addended by: Emi Holes D on: 08/05/2018 10:30 AM   Modules accepted: Orders

## 2018-08-05 NOTE — Progress Notes (Signed)
OFFICE VISIT  08/05/2018   CC:  Chief Complaint  Patient presents with  . Follow-up    RCI, pt is not fasting.     HPI:    Patient is a 58 y.o. Caucasian female who presents for f/u HTN, chronic pain syndrome, and recurrent MDD with GAD.  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  Happier with this arrangement.  Mood is stable.  HTN:  No home monitoring.  HAs much less since change to 3rd shift.  NO acute vision c/o.  Chronic pain: using the pain med for her back only rarely lately, but lately has been taking a few for her chronic R shoulder pain that is worsening.  +Like a tooth ache, hurts to lie on it, hurts to reach up and out/back.  No neck pain.  The pain is all across anterior aspect of R shoulder and over R deltoid.  No radiation down arm and no paresthesias.  No arm weakness.  No known preceding trauma or strain. I injected subacromial bursa region 3 and 1/2 mo ago and it helped moderately well.  She wants injection again today.   Indication for chronic opioid: chronic bilat LBP w/out radiculopathy Medication and dose: Vicodin 5/325, 1-2q6h prn # pills per month: 120 but the last time she filled this med was 01/2017. Last UDS date: 05/01/17. Opioid Treatment Agreement signed (Y/N): 04/21/2018. Opioid Treatment Agreement last reviewed with patient: today [facility] reviewed this encounter (include red flags): today, no red flags.  ROS: no CP, no SOB, no wheezing, no cough, no dizziness, no HAs, no rashes, no melena/hematochezia.  No polyuria or polydipsia.  No myalgias or arthralgias.   Past Medical History:  Diagnosis Date  . Abnormal nuclear stress test 12/31/11   Low risk lexiscan, but left heart cath was recommended and this showed normal coronary arteries and normal LV function. (01/01/12)  . Anxiety    + ?mood disorder (awaiting old psych records as of 12/19/11.  . Carpal tunnel  syndrome on both sides   . Chronic pain syndrome    low back pain  . Colon cancer screening    Pt did not return cologuard 10/2016.  She has chosen not to get colonoscopy as of 2018 f/u.  Pt did not return cologuard 09/2017.  Marland Kitchen. Daytime somnolence    Nuvigil rx'd by her psychiatrist.  Pt reports that no sleep study has been done.  . DDD (degenerative disc disease), lumbar    Pt says she has hx of herniated disc that has resulted in some numbness in a few toes on left foot.  . 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  I have yet to see/hear any evidence of borderline personality d/o in her since she has been my pt.  . DM2 (diabetes mellitus, type 2) (HCC)    Managed by ENDO--Dr. [name].  +hx of GDM both pregnancies, dx'd with DM 2 in her 30s--denies hx of complications  with eyes, kidneys, nerves, or CV system.  . Hepatic steatosis   . History of vitamin D deficiency 2009  . Hyperlipidemia   . Hypertension   . Iron deficiency anemia 2013   Hx of: Likely occult upper GI bleeding.  Hb 8.6--came up to 12 at most recent check after getting on integra, which she now continues once daily.   iFOB was negative 01/13/12.  She says she had been taking excessive NSAIDs at the time.  She has never had a colonoscopy.  . Low back pain 12/19/2011  . Migraine syndrome   . Mixed urge and stress incontinence   . Multinodular goiter    Hx of multiple benign biopsies; repeated u/s's showed no change until the one on 04/2014.  FNA 06/2013 showed R and isthmus nodules intermediate so f/u bx 6 mo recommended.  Repeat bx 07/26/15 showed R lobe benign, isthmus atypical cells of undetermined signif,--detailed path analysis was done and it was BENIGN.  U/S 11/2017 -->NO CHANGE in nodules.  . Obesity   . Plantar fasciitis    Steroid injection by  podiatrist 09/2017  . Renal cyst, acquired, right 2017  . Toe fracture, left 09/2017   Distal phalanx L third toe (saw podiatrist)    Past Surgical History:  Procedure Laterality Date  . CARDIAC CATHETERIZATION  01/01/12   Normal coronaries and normal LV function.  Marland Kitchen CESAREAN SECTION     X 2  . FNA bx Thyroid nodules  06/29/14   x 4: some findings to support benign cyst and some "cellular atypica of indeterminate significance".--followed by Dr. Elvera Lennox.  Repeat bx 05/2015 BENIGN.    Outpatient Medications Prior to Visit  Medication Sig Dispense Refill  . aspirin 81 MG tablet Take 81 mg by mouth daily.    Marland Kitchen atorvastatin (LIPITOR) 80 MG tablet TAKE 1 TABLET BY MOUTH EVERY DAY 90 tablet 0  . busPIRone (BUSPAR) 5 MG tablet TAKE 1/2 TABLET THREE TIMES A DAY 90 tablet 0  . butalbital-acetaminophen-caffeine (FIORICET, ESGIC) 50-325-40 MG tablet take 1 to 2 tablets by mouth twice a day if needed for headache 60 tablet 1  . dicyclomine (BENTYL) 10 MG capsule 1 tab po tid prn abd bloating/cramping 30 capsule 0  . DULoxetine (CYMBALTA) 60 MG capsule TAKE 1 CAPSULE BY MOUTH EVERY DAY 30 capsule 5  . glipiZIDE (GLUCOTROL XL) 5 MG 24 hr tablet TAKE 2 TABLETS BY MOUTH ONCE DAILY WITH BREAKFAST 60 tablet 6  . glucose blood (ONETOUCH VERIO) test strip Use as instructed to check sugar 2 times daily 200 each 5  . hydrochlorothiazide (HYDRODIURIL) 25 MG tablet TAKE 1 TABLET BY MOUTH EVERY DAY 30 tablet 5  . HYDROcodone-acetaminophen (NORCO/VICODIN) 5-325 MG tablet TAKE 1 TO 2 TABLETS EVERY 6 HOURS AS NEEDED FOR PAIN 120 tablet 0  . hydrocortisone (PROCTOSOL HC) 2.5 % rectal cream apply rectally twice a day 30 g 6  . Insulin Glargine (LANTUS SOLOSTAR) 100 UNIT/ML Solostar Pen INJECT 45 UNITS INTO THE SKIN DAILY AT 10 PM. 15 pen 3  . Insulin Pen Needle 31G X 4 MM MISC 1 each by Does not apply route at bedtime. 100 each 5  . lamoTRIgine (LAMICTAL) 200 MG tablet TAKE 1 TABLET BY MOUTH EVERY DAY 30 tablet 6  .  Lancet Devices (ONE TOUCH DELICA LANCING DEV) MISC Use to check sugar 2 times daily 200 each 5  . lisinopril (PRINIVIL,ZESTRIL) 40 MG tablet TAKE 1 TABLET BY MOUTH EVERY DAY 90 tablet 1  . LORazepam (ATIVAN) 0.5 MG tablet TAKE 2 TABLETS BY MOUTH AT BEDTIME 60 tablet 5  . metFORMIN (GLUCOPHAGE) 1000 MG tablet TAKE 1 TABLET BY MOUTH TWICE A DAY FOR DIABETES 180 tablet 1  . metoprolol succinate (TOPROL-XL) 25 MG 24 hr tablet  Take 1 tablet (25 mg total) by mouth daily. 30 tablet 3  . Multiple Vitamin (MULTIVITAMIN) tablet Take 1 tablet by mouth daily.    . Multiple Vitamins-Minerals (PRESERVISION AREDS) CAPS Take 1 capsule by mouth daily.    . Omega-3 Fatty Acids (FISH OIL) 1000 MG CAPS Take 1 capsule by mouth at bedtime.    Marland Kitchen omeprazole (PRILOSEC) 40 MG capsule TAKE 1 CAPSULE BY MOUTH EVERY DAY 30 capsule 5  . oxybutynin (DITROPAN-XL) 10 MG 24 hr tablet TAKE 1 TABLET BY MOUTH EVERY DAY 30 tablet 5  . promethazine (PHENERGAN) 12.5 MG tablet 1-2 tabs po q6h prn nausea/vomiting 20 tablet 3  . canagliflozin (INVOKANA) 100 MG TABS tablet TAKE 1 TABLET BY MOUTH DAILY BEFORE THE FIRST MEAL OF THE DAY (Patient not taking: Reported on 08/05/2018) 30 tablet 11  . fluconazole (DIFLUCAN) 150 MG tablet 1 tab po qd x 3d (Patient not taking: Reported on 08/05/2018) 3 tablet 1   No facility-administered medications prior to visit.     Allergies  Allergen Reactions  . Levofloxacin In D5w Hives  . Penicillins     Blistery rash  . Victoza [Liraglutide] Nausea Only    ROS As per HPI  PE: Blood pressure 100/64, pulse 74, temperature 98 F (36.7 C), temperature source Oral, resp. rate 16, height 5\' 5"  (1.651 m), weight 238 lb 4 oz (108.1 kg), last menstrual period 06/04/2013, SpO2 100 %. Body mass index is 39.65 kg/m.  Gen: Alert, well appearing.  Patient is oriented to person, place, time, and situation. AFFECT: pleasant, lucid thought and speech. CV: RRR, no m/r/g.   LUNGS: CTA bilat, nonlabored resps,  good aeration in all lung fields. Right shoulder: she can aBduct to 90 deg and then feels significant shoulder pain anterolaterally, very much difficulty getting aBduction further, also impaired ER and IR on right.  +Mild TTP under acromion on R, +impingement sign.  No AC joint tenderness.  +speeds and yergason's positive on R. UE strength 5/5 prox/dist bilat.  Neck ROM intact w/out pain.   LABS:  Lab Results  Component Value Date   TSH 1.34 01/18/2018   Lab Results  Component Value Date   WBC 4.8 01/18/2018   HGB 11.9 (L) 01/18/2018   HCT 35.8 (L) 01/18/2018   MCV 84.9 01/18/2018   PLT 255.0 01/18/2018   Lab Results  Component Value Date   CREATININE 0.73 04/21/2018   BUN 13 04/21/2018   NA 137 04/21/2018   K 4.0 04/21/2018   CL 102 04/21/2018   CO2 26 04/21/2018   Lab Results  Component Value Date   ALT 15 01/18/2018   AST 10 01/18/2018   ALKPHOS 95 01/18/2018   BILITOT 0.4 01/18/2018   Lab Results  Component Value Date   CHOL 159 01/18/2018   Lab Results  Component Value Date   HDL 34.00 (L) 01/18/2018   Lab Results  Component Value Date   LDLCALC 88 01/18/2018   Lab Results  Component Value Date   TRIG 184.0 (H) 01/18/2018   Lab Results  Component Value Date   CHOLHDL 5 01/18/2018   Lab Results  Component Value Date   HGBA1C 8.3 (A) 02/08/2018    IMPRESSION AND PLAN:  1) HTN: The current medical regimen is effective;  continue present plan and medications. Lytes/cr today.  2) MDD, GAD: The current medical regimen is effective;  continue present plan and medications. Most recent lorazepam dose was last night.  3) Chronic pain syndrome: LBP not  much of any issue lately, but R shoulder has required some use of her vicodin (still not using much at all).   Right shoulder pain: subacromial bursitis and bicipital tendonitis: R subacromial steroid injection today. PT referral today.  Ibup 600 mg bid prn.  If not signif improved with this injection and  PT then she will need to see an orthopedist for further evaluation. Buprenorphine on "PMP AWARE" but this is a DVM that rx'd it for her sick/dying dog. Vicodin: she thinks she may have taken one yesterday morning/mid day but she is not sure (she has not even filled the most recent rx I did for her on 04/21/2018. [facility] UTD. UDS today.   Procedure: Therapeutic right shoulder subacromial injection.  The patient's clinical condition is marked by substantial pain and/or significant functional disability.  Other conservative therapy has not provided relief, is contraindicated, or not appropriate.  There is a reasonable likelihood that injection will significantly improve the patient's pain and/or functional disability. Cleaned skin with alcohol swab, used posterolateral approach, Injected 1ml of 40mg /ml depo medrol + 2  ml of 1% plain lidocaine into subacromial space without resistance.  No immediate complications.  Patient tolerated procedure well.  Post-injection care discussed, including 20 min of icing 1-2 times in the next 4-8 hours.  An After Visit Summary was printed and given to the patient.  FOLLOW UP: Return in about 3 months (around 11/03/2018) for routine chronic illness f/u.  Signed:  Santiago BumpersPhil Prospero Mahnke, MD           08/05/2018

## 2018-08-07 LAB — PAIN MGMT, PROFILE 8 W/CONF, U
6 ACETYLMORPHINE: NEGATIVE ng/mL (ref <10)
AMPHETAMINES: NEGATIVE ng/mL (ref <500)
Alcohol Metabolites: NEGATIVE ng/mL (ref <500)
BENZODIAZEPINES: NEGATIVE ng/mL (ref <100)
BUPRENORPHINE, URINE: NEGATIVE ng/mL (ref <5)
COCAINE METABOLITE: NEGATIVE ng/mL (ref <150)
CODEINE: NEGATIVE ng/mL (ref <50)
Creatinine: 38.7 mg/dL
HYDROCODONE: NEGATIVE ng/mL (ref <50)
HYDROMORPHONE: NEGATIVE ng/mL (ref <50)
MDMA: NEGATIVE ng/mL (ref <500)
Marijuana Metabolite: NEGATIVE ng/mL (ref <20)
Morphine: NEGATIVE ng/mL (ref <50)
NORHYDROCODONE: 85 ng/mL — AB (ref <50)
OXYCODONE: NEGATIVE ng/mL (ref <100)
Opiates: POSITIVE ng/mL — AB (ref <100)
Oxidant: NEGATIVE ug/mL (ref <200)
pH: 5.67 (ref 4.5–9.0)

## 2018-08-26 ENCOUNTER — Encounter: Payer: Self-pay | Admitting: Internal Medicine

## 2018-08-26 ENCOUNTER — Ambulatory Visit: Payer: 59 | Admitting: Internal Medicine

## 2018-08-26 ENCOUNTER — Other Ambulatory Visit: Payer: Self-pay

## 2018-08-26 ENCOUNTER — Encounter

## 2018-08-26 VITALS — BP 140/78 | HR 66 | Ht 65.0 in | Wt 240.0 lb

## 2018-08-26 DIAGNOSIS — E785 Hyperlipidemia, unspecified: Secondary | ICD-10-CM | POA: Diagnosis not present

## 2018-08-26 DIAGNOSIS — E042 Nontoxic multinodular goiter: Secondary | ICD-10-CM | POA: Diagnosis not present

## 2018-08-26 DIAGNOSIS — IMO0001 Reserved for inherently not codable concepts without codable children: Secondary | ICD-10-CM

## 2018-08-26 DIAGNOSIS — E1165 Type 2 diabetes mellitus with hyperglycemia: Secondary | ICD-10-CM | POA: Diagnosis not present

## 2018-08-26 LAB — POCT GLYCOSYLATED HEMOGLOBIN (HGB A1C): Hemoglobin A1C: 11.6 % — AB (ref 4.0–5.6)

## 2018-08-26 MED ORDER — INSULIN DEGLUDEC 200 UNIT/ML ~~LOC~~ SOPN: 46.0000 [IU] | PEN_INJECTOR | Freq: Every day | SUBCUTANEOUS | 5 refills | Status: DC

## 2018-08-26 NOTE — Progress Notes (Signed)
Patient ID: Kristi Guerrero, female   DOB: 12-04-1960, 58 y.o.   MRN: [id number]    HPI  Kristi Guerrero is a 58 y.o.-year-old female, returning for f/u for MNG and insulin-dependent DM2, dx'ed in the 1990s- had GDM x2, uncontrolled, insulin-dependent. Last visit 7 months ago.  Since last OV, she had a lot of stress at home, at work and one of her cat died. She is now on 3rd shift >> does not sleep well.   She had 2 steroid inj's in shoulder  - last 2-3 weeks ago.  Since last visit, we had to decrease the dose of Ozempic 2/2 GI intolerance.  I advised her to decrease the dose from 1 mg to 0.5 mg weekly, but she did not start the lower dose and she is now off Ozempic completely.  She also missed many medication doses since then.  DM2:  Last HbA1c:  Lab Results  Component Value Date   HGBA1C 8.3 (A) 02/08/2018   HGBA1C 10.3 (A) 11/06/2017   HGBA1C 9.6 08/06/2017   HGBA1C 11.1 03/13/2017   HGBA1C 10.2 07/22/2016   HGBA1C 12.1 04/08/2016   HGBA1C 6.7 06/13/2015   HGBA1C 8.2 (H) 11/17/2014   HGBA1C 8.4 (H) 06/19/2014   HGBA1C 8.0 (H) 03/06/2014   HGBA1C 8.5 (H) 10/21/2013   HGBA1C 7.5 (H) 03/31/2013   HGBA1C 6.3 (H) 09/29/2012   HGBA1C 6.5 05/31/2012   HGBA1C 7.4 (H) 12/19/2011   She is on: -  >> stopped 2 mo ago 2/2 pharmacy issues - Metformin 1000 mg 2x a day with meals. - Glipizide XL 10 mg in am.   -  >> we increased the dose but she had diarrhea and nausea >> stopped - Lantus 45-48 units at bedtime >> missed doses - off and on Tried Januvia >> expensive. Tried Byetta >> did not work well.  Tried Victoza >> nausea. She was on Actos in the past.  She is checking her sugars seldom: - am: 180-240 >> mid 200s >> 150-200 >> upper 200s - 2h after b'fast: n/c - lunch: 120s >> n/c - 2h after lunch: n/c - dinner: 112-130s >> mid200s >> n/c - 2h after dinner: 200s >> 300s >> 200 >>200-300s - bedtime: 180s >> n/c It is unclear at which level she has hypoglycemia awareness  No  CKD. Last BUN/Cr: Lab Results  Component Value Date   BUN 24 (H) 08/05/2018   Lab Results  Component Value Date   CREATININE 0.84 08/05/2018  On lisinopril.  + HL. Last Lipid panel: Lab Results  Component Value Date   CHOL 159 01/18/2018   HDL 34.00 (L) 01/18/2018   LDLCALC 88 01/18/2018   LDLDIRECT 194.0 01/30/2017   TRIG 184.0 (H) 01/18/2018   CHOLHDL 5 01/18/2018  On Lipitor.  Last eye exam: 2015-2016: No DR.  No numbness and tingling in her feet. Has a herniated disk >> numbness in toes since ~2009.  Latest foot exam was normal in 08/2017.  MNG: Pt was dx with thyroid nodules in 2006.  She previously saw endocrinology in [place].  Reviewed and addended history: Thyroid U/S (04/20/2014) - 4 nodules, 2 enlarged from previous U/S in 2006:  Right thyroid lobe: 5.5 x 2.6 x 3.3 cm. Complex interpolar region nodule measures 3.0 x 1.9 x 2.8 cm. It is predominately solid with asmall cystic area. On the previous report, this was described to be 1.5 x 1.1 x 1.7 cm.   Left thyroid lobe: 5.4 x 2.8 x 2.5 cm. 2.8 x  2.2 x 2.3 cm interpolar region solid heterogeneous nodule. This was previously described to measure 1.7 x 0.9 x 1.8 cm. Smaller lower pole nodule measures 1.5 x 1.4 x 1.3 cm there are some peripheral calcifications.   Isthmus Thickness: 4 mm. Solid 2.7 x 1.6 x 2.3 cm heterogeneous nodule. On the previous report, measurements of this nodule were 2.7 x 2.4 x 1.7 cm.   Lymphadenopathy: None visualized.  We Bx'ed the 4 nodules on 07/01/2014: - 2 x FLUS - 2 x benign  Repeated Bx's of the FLUS nodules on 07/26/2015: - R nodule: benign - isthmic nodule: FLUS/AUS  Afirma test for isthmic nodule on 07/26/2015: Benign  Thyroid U/S (11/26/2017): Thyroid nodules were stable or smaller  Latest TSH normal: Lab Results  Component Value Date   TSH 1.34 01/18/2018    Pt denies: - feeling nodules in neck - hoarseness - dysphagia - choking - SOB with lying down  Pt  does have a + FH of follicular thyroid cancer in mother.   Works in [employer].  ROS: Constitutional: no weight gain/no weight loss, + fatigue, + subjective hyperthermia, no subjective hypothermia Eyes: no blurry vision, no xerophthalmia ENT: no sore throat, + see HPI Cardiovascular: no CP/no SOB/no palpitations/no leg swelling Respiratory: no cough/no SOB/no wheezing Gastrointestinal: no N/no V/no D/no C/no acid reflux Musculoskeletal: + muscle aches/no joint aches Skin: no rashes, no hair loss Neurological: no tremors/no numbness/no tingling/no dizziness, + HA  I reviewed pt's medications, allergies, PMH, social hx, family hx, and changes were documented in the history of present illness. Otherwise, unchanged from my initial visit note.  Past Medical History:  Diagnosis Date  . Abnormal nuclear stress test 12/31/11   Low risk lexiscan, but left heart cath was recommended and this showed normal coronary arteries and normal LV function. (01/01/12)  . Anxiety    + ?mood disorder (awaiting old psych records as of 12/19/11.  . Carpal tunnel syndrome on both sides   . Chronic pain syndrome    low back pain  . Colon cancer screening    Pt did not return cologuard 10/2016.  She has chosen not to get colonoscopy as of 2018 f/u.  Pt did not return cologuard 09/2017.  Marland Kitchen Daytime somnolence    Nuvigil rx'd by her psychiatrist.  Pt reports that no sleep study has been done.  . DDD (degenerative disc disease), lumbar    Pt says she has hx of herniated disc that has resulted in some numbness in a few toes on left foot.  . 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  I have yet to see/hear any evidence of borderline personality d/o in her since she has been my pt.  . DM2 (diabetes mellitus, type 2) (HCC)    Managed by  ENDO--Dr. Elvera Lennox.  +hx of GDM both pregnancies, dx'd with DM 2 in her 30s--denies hx of complications  with eyes, kidneys, nerves, or CV system.  . Hepatic steatosis   . History of vitamin D deficiency 2009  . Hyperlipidemia   . Hypertension   . Iron deficiency anemia 2013   Hx of: Likely occult upper GI bleeding.  Hb 8.6--came up to 12 at most recent check after getting on integra, which she now continues once daily.   iFOB was negative 01/13/12.  She says she had been taking excessive NSAIDs at the time.  She has never had a colonoscopy.  . Low back pain 12/19/2011  . Migraine syndrome   . Mixed urge and stress incontinence   . Multinodular goiter    Hx of multiple benign biopsies; repeated u/s's showed no change until the one on 04/2014.  FNA 06/2013 showed R and isthmus nodules intermediate so f/u bx 6 mo recommended.  Repeat bx 07/26/15 showed R lobe benign, isthmus atypical cells of undetermined signif,--detailed path analysis was done and it was BENIGN.  U/S 11/2017 -->NO CHANGE in nodules.  . Obesity   . Plantar fasciitis    Steroid injection by podiatrist 09/2017  . Renal cyst, acquired, right 2017  . Toe fracture, left 09/2017   Distal phalanx L third toe (saw podiatrist)   Past Surgical History:  Procedure Laterality Date  . CARDIAC CATHETERIZATION  01/01/12   Normal coronaries and normal LV function.  Marland Kitchen CESAREAN SECTION     X 2  . FNA bx Thyroid nodules  06/29/14   x 4: some findings to support benign cyst and some "cellular atypica of indeterminate significance".--followed by Dr. Elvera Lennox.  Repeat bx 05/2015 BENIGN.   History   Social History Main Topics  . Smoking status: Never Smoker   . Smokeless tobacco: Never Used  . Alcohol Use: No  . Drug Use: No   Social History Narrative   Divorced, 2 grown children (one in [place] and one in [place]).  One grandchild.   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   No T/A/Ds.   Exercise: occ goes to the [facility].    Currently living in [place], [place].   Current Outpatient Medications on File Prior to Visit  Medication Sig Dispense Refill  . aspirin 81 MG tablet Take 81 mg by mouth daily.    Marland Kitchen atorvastatin (LIPITOR) 80 MG tablet TAKE 1 TABLET BY MOUTH EVERY DAY 90 tablet 0  . busPIRone (BUSPAR) 5 MG tablet TAKE 1/2 TABLET THREE TIMES A DAY 90 tablet 0  . butalbital-acetaminophen-caffeine (FIORICET, ESGIC) 50-325-40 MG tablet take 1 to 2 tablets by mouth twice a day if needed for headache 60 tablet 1  . canagliflozin (INVOKANA) 100 MG TABS tablet TAKE 1 TABLET BY MOUTH DAILY BEFORE THE FIRST MEAL OF THE DAY (Patient not taking: Reported on 08/05/2018) 30 tablet 11  . dicyclomine (BENTYL) 10 MG capsule 1 tab po tid prn abd bloating/cramping 30 capsule 0  . DULoxetine (CYMBALTA) 60 MG capsule TAKE 1 CAPSULE BY MOUTH EVERY DAY 30 capsule 5  . fluconazole (DIFLUCAN) 150 MG tablet 1 tab po qd x 3d (Patient not taking: Reported on 08/05/2018) 3 tablet 1  . glipiZIDE (GLUCOTROL XL) 5 MG 24 hr tablet TAKE 2 TABLETS BY MOUTH ONCE DAILY WITH BREAKFAST 60 tablet 6  . glucose blood (ONETOUCH VERIO) test strip Use as instructed to check sugar 2 times daily 200 each 5  . hydrochlorothiazide (HYDRODIURIL) 25 MG tablet TAKE 1 TABLET BY MOUTH EVERY DAY 30 tablet 5  . HYDROcodone-acetaminophen (NORCO/VICODIN) 5-325 MG tablet TAKE 1 TO 2 TABLETS EVERY 6 HOURS AS NEEDED FOR PAIN 120 tablet 0  . hydrocortisone (PROCTOSOL HC) 2.5 % rectal cream apply rectally twice a day 30 g 6  . Insulin Glargine (LANTUS SOLOSTAR) 100 UNIT/ML Solostar Pen INJECT 45 UNITS INTO THE SKIN DAILY AT 10 PM. 15 pen 3  . Insulin Pen Needle 31G X 4 MM MISC 1 each  by Does not apply route at bedtime. 100 each 5  . lamoTRIgine (LAMICTAL) 200 MG tablet TAKE 1 TABLET BY MOUTH EVERY DAY 30 tablet 6  . Lancet Devices (ONE TOUCH DELICA LANCING DEV) MISC Use to check sugar 2 times daily 200 each 5  . lisinopril (PRINIVIL,ZESTRIL) 40 MG tablet TAKE 1 TABLET BY MOUTH  EVERY DAY 90 tablet 1  . LORazepam (ATIVAN) 0.5 MG tablet TAKE 2 TABLETS BY MOUTH AT BEDTIME 60 tablet 5  . metFORMIN (GLUCOPHAGE) 1000 MG tablet TAKE 1 TABLET BY MOUTH TWICE A DAY FOR DIABETES 180 tablet 1  . metoprolol succinate (TOPROL-XL) 25 MG 24 hr tablet Take 1 tablet (25 mg total) by mouth daily. 30 tablet 3  . Multiple Vitamin (MULTIVITAMIN) tablet Take 1 tablet by mouth daily.    . Multiple Vitamins-Minerals (PRESERVISION AREDS) CAPS Take 1 capsule by mouth daily.    . Omega-3 Fatty Acids (FISH OIL) 1000 MG CAPS Take 1 capsule by mouth at bedtime.    Marland Kitchen omeprazole (PRILOSEC) 40 MG capsule TAKE 1 CAPSULE BY MOUTH EVERY DAY 30 capsule 5  . oxybutynin (DITROPAN-XL) 10 MG 24 hr tablet TAKE 1 TABLET BY MOUTH EVERY DAY 30 tablet 5  . promethazine (PHENERGAN) 12.5 MG tablet 1-2 tabs po q6h prn nausea/vomiting 20 tablet 3   No current facility-administered medications on file prior to visit.    Allergies  Allergen Reactions  . Levofloxacin In D5w Hives  . Penicillins     Blistery rash  . Victoza [Liraglutide] Nausea Only   Family History  Problem Relation Age of Onset  . Cancer Mother        breast and follicular thyroid cancer  . Depression Mother   . Breast cancer Mother   . Diabetes Father   . Alcohol abuse Sister   . Hypertension Maternal Grandmother   . Hyperlipidemia Maternal Grandmother   . Heart disease Maternal Grandmother   . Stroke Maternal Grandmother   . Cancer Maternal Grandfather        colon cancer  . Stroke Maternal Grandfather   . Hypertension Maternal Grandfather   . Hyperlipidemia Maternal Grandfather   . Heart disease Maternal Grandfather    PE: BP 140/78   Pulse 66   Ht 5\' 5"  (1.651 m)   Wt 240 lb (108.9 kg)   LMP 06/04/2013   SpO2 97%   BMI 39.94 kg/m  Body mass index is 39.94 kg/m. Wt Readings from Last 3 Encounters:  08/26/18 240 lb (108.9 kg)  08/05/18 238 lb 4 oz (108.1 kg)  04/21/18 241 lb 4 oz (109.4 kg)   Constitutional: overweight,  in NAD Eyes: PERRLA, EOMI, no exophthalmos ENT: moist mucous membranes, + right thyroid fullness and isthmic nodule palpable, no cervical lymphadenopathy Cardiovascular: RRR, No MRG Respiratory: CTA B Gastrointestinal: abdomen soft, NT, ND, BS+ Musculoskeletal: no deformities, strength intact in all 4 Skin: moist, warm, no rashes Neurological: no tremor with outstretched hands, DTR normal in all 4  Assessment: 1. DM2, insulin-dep., uncontrolled  2. MNG - thyroid U/S (04/20/2014):   Adequacy Reason Satisfactory For Evaluation. Diagnosis THYROID, FINE NEEDLE ASPIRATION ISTHMUS, (SPECIMEN 1 OF 4, COLLECTED ON 06/29/2014) ATYPIA OF UNDETERMINED SIGNIFICANCE OR FOLLICULAR LESION OF UNDETERMINED SIGNIFICANCE (B[place]ESDA CATEGORY III). SEE COMMENT. COMMENT: THE SPECIMEN CONSISTS OF SMALL AND MEDIUM SIZED GROUPS OF FOLLICULAR EPITHELIAL CELLS AND SOME BACKGROUND COLLOID. SOME OF THE GROUPS OF CELLS ARE ARRANGED AS MICROFOLLICLES. THERE ARE SCATTERED INTRANUCLEAR GROOVES. BASED ON THESE FEATURES, A FOLLICULAR LESION/NEOPLASM CAN NOT  BE ENTIRELY RULED OUT. Pecola Leisure MD Pathologist, Electronic Signature (Case signed 06/30/2014) Specimen Clinical Information Solid 2.7 x 1.6 x 2.3cm heterogeneous nodule Source Thyroid, Fine Needle Aspiration, Isthmus, (Specimen 1 of 4, collected on 06/29/2014)   Adequacy Reason Satisfactory For Evaluation. Diagnosis THYROID, FINE NEEDLE ASPIRATION (SPECIMEN 2 OF 4 COLLECTED 06-29-2014) ATYPIA OF UNDETERMINED SIGNIFICANCE OR FOLLICULAR LESION OF UNDETERMINED SIGNIFICANCE (BETHESDA CATEGORY III). SEE COMMENT. COMMENT: THE SPECIMEN IS SOMEWHAT HYPOCELLULAR, HINDERING OPTIMAL CYTOLOGIC EVALUATION. HOWEVER, THERE ARE SCATTERED SMALL GROUPS OF FOLLICULAR EPITHELIAL CELLS WITH MILD CYTOLOGIC ATYPIA, INCLUDING CONSPICUOUS INTRANUCLEAR GROOVES. BASED ON THESE FEATURES, A FOLLICULAR LESION/NEOPLASM CAN NOT BE ENTIRELY RULED OUT. Pecola Leisure MD Pathologist,  Electronic Signature (Case signed 06/30/2014) Specimen Clinical Information Right interpolar region nodule measures 3.0 x 1.9 x 2.8cm, It is predominantely solid with a small cystic area Source Thyroid, Fine Needle Aspiration, Right, (Specimen 2 of 4, collected on 06/29/2014)   Adequacy Reason Satisfactory For Evaluation. Diagnosis FINE NEEDLE ASPIRATION, THYROID, LLP (SPECIMEN 3 OF 4 COLLECTED ON 06/29/14): CONSISTENT WITH BENIGN FOLLICULAR NODULE (BETHESDA CATEGORY II). Pecola Leisure MD Pathologist, Electronic Signature (Case signed 06/30/2014) Specimen Clinical Information Smaller llp nodule 1.5 x 1.4 x 1.3cm Source Thyroid, Fine Needle Aspiration, LLP, (Specimen 3 of 4, collected on 06/29/2014)   Adequacy Reason Satisfactory But Limited For Evaluation, Scant Cellularity. Diagnosis THYROID, FINE NEEDLE ASPIRATION: LEFT INTERPOLAR (4 OF 4 COLLECTED ON 06/29/2014) SCANT FOLLICULAR EPITHELIUM PRESENT (BETHESDA CATEGORY I). FINDINGS CONSISTENT WITH THE CONTENTS OF A CYST. Pecola Leisure MD Pathologist, Electronic Signature (Case signed 06/30/2014) Specimen Clinical Information 2.8 x 2.2 x 2.3cm interpolar region solid heterogeneous nodule Source Thyroid, Fine Needle Aspiration, Left Interpolar, (Specimen 4 of 4, collected on 06/29/2014)  (07/27/2015): Adequacy Reason Satisfactory For Evaluation. Diagnosis THYROID, FINE NEEDLE ASPIRATION RIGHT MID POLE, (SPECIMEN 1 OF 2 COLLECTED 07/26/2015) CONSISTENT WITH BENIGN FOLLICULAR NODULE (BETHESDA CATEGORY II). Pecola Leisure MD Pathologist, Electronic Signature (Case signed 07/27/2015) Specimen Clinical Information Previous biospy 06/29/14, Dominant 33 x 25 x 31 mm mostly solid nodule, right mid lobe (previously 30 x 19 x 28) Source Thyroid, Fine Needle Aspiration, RMP, (Specimen 1 of 2, collected on 07/26/2015)  Adequacy Reason Satisfactory For Evaluation. Diagnosis THYROID, FINE NEEDLE ASPIRATION, RIGHT ISTHMUS (SPECIMEN 2 OF 2  COLLECTED 07-26-2015) ATYPIA OF UNDETERMINED SIGNIFICANCE OR FOLLICULAR LESION OF UNDETERMINED SIGNIFICANCE (BETHESDA CATEGORY III). SEE COMMENT. COMMENT: THE SPECIMEN IS MILDLY CELLULAR AND CONSISTS OF SMALL AND MEDIUM SIZED GROUPS OF FOLLICULAR EPITHELIAL CELLS WITH MINIMAL CYTOLOGIC ATYPIA INCLUDING NUCLEAR OVERLAPPING. A SAMPLE WILL BE SENT FOR AFFIRMA TESTING AND THE RESULTS REPORTED SEPARATELY. Pecola Leisure MD Pathologist, Electronic Signature (Case signed 07/27/2015) Specimen Clinical Information Dominant 27 x 17 x 23 mm slightly echogenic solid nodule, right of midline (previously 27 x 16 x 23) Source Thyroid, Fine Needle Aspiration, Right Isthmus, (Specimen 2 of 2, collected on 07/26/2015)  The R thyroid nodule is benign.  The isthmic nodule is still a AUS/FLUS, but Afirma result is BENIGN. (MTC negative).   Thyroid ultrasound (11/26/2017): Thyroid nodules are stable or smaller: CLINICAL DATA: Follow-up thyroid nodules. Four thyroid nodules were biopsied on 06/29/2014. Repeat biopsy of the isthmus and right thyroid nodule on 07/23/2017.  EXAM: THYROID ULTRASOUND  TECHNIQUE: Ultrasound examination of the thyroid gland and adjacent soft tissues was performed.  COMPARISON: 07/02/2015  FINDINGS: Parenchymal Echotexture: Markedly heterogenous  Isthmus: 0.5 cm, previously 1.1 cm  Right lobe: 5.2 x 2.3 x 2.9 cm, previously 5.8 x 3.3 x 3.2 cm  Left lobe: 5.2 x 2.0 x  2.3 cm, previously 5.9 x 2.5 x 2.5 cm  _________________________________________________________  Estimated total number of nodules >/= 1 cm: 4  Number of spongiform nodules >/= 2 cm not described below (TR1): 0  Number of mixed cystic and solid nodules >/= 1.5 cm not described below (TR2): 0  _________________________________________________________  Previous biopsy of the isthmus nodule, right thyroid nodule and the 2 left thyroid nodules.  The isthmus nodule is heterogeneous and appears to have  some peripheral calcifications. The isthmus nodule measures 2.5 x 1.3 x 2.0 cm and previously measured 2.7 x 1.7 x 2.3 cm.  Heterogeneous isoechoic nodule in the mid right thyroid lobe measures 3.2 x 2.0 x 2.9 cm and previously measured 3.3 x 2.5 x 3.1 cm. Cannot exclude a few macro calcifications within this right thyroid nodule.  Heterogeneous isoechoic nodule in the mid left thyroid lobe measures 2.9 x 2.0 x 2.3 cm and previously measured 3.2 x 2.1 x 2.4 cm.  Slightly hypoechoic nodule in the inferior left thyroid lobe measures 1.4 x 1.4 x 1.5 cm and previously measured 1.4 x 1.2 x 1.4 cm. Question macrocalcification along the anterior aspect of this nodule.  IMPRESSION: Multinodular goiter. The previously biopsied dominant nodules have minimally changed in size.  No new suspicious thyroid nodules.   Electronically Signed By: Richarda Overlie M.D. On: 11/26/2017 10:45   3. HL  PLAN:  1. DM2 - Patient with longstanding, uncontrolled, type 2 diabetes, on oral antidiabetic regimen, GLP-1 receptor agonist, and long-acting insulin.  At last visit, sugars were slightly better, but she still was not checking frequently.  Since the A1c was 8.3%, still above target, I advised her to increase Ozempic dose.  She could not tolerate this dose very well so I advised her to restart the 0.5 mg dose weekly since then.  However, she did not do so.  She is off Ozempic now. -At this visit, she is not checking sugars consistently, however, today, HbA1c is 11.6% (much higher) -Since last visit she had a lot of personal problems and she also started to work third shift.  She missed many insulin doses and she is off Invokana and Ozempic completely. -She agrees to retry Ozempic at the lower dose.  I advised her to take 2 doses of the 0.25 mg and then increase to 0.5 g weekly and stay on this dose. -We will also restart Invokana, which she just needs to get it from another pharmacy. -Also, we will try  to switch from Lantus to [place] since this is better for patients that do work shift work.  Given coupon card. -I advised her to: Patient Instructions  Please continue: - Metformin 1000 mg 2x a day with meals. - Glipizide XL 10 mg in am.    Switch from Lantus to [place] 46 units daily.  Please restart: - Invokana 100 mg in am - Ozempic 0.5 mg weekly  Please return in 3 months with your sugar log.    - continue checking sugars at different times of the day - check 2x a day, rotating checks - advised for yearly eye exams >> she is not UTD - Return to clinic in 3 mo with sugar log     2. MNG  -Patient with a history of multinodular goiter, with the right and isthmic thyroid nodules initially indeterminant of first biopsy.  On the second biopsy the right thyroid nodule was benign, while the isthmic nodule was again indeterminate.  The [facility] molecular marker test was benign, though, so  in this case, the smooth nodule is likely benign -We repeated another thyroid ultrasound in 11/2017: Nodules were either stable or some even smaller -Latest TSH was normal in 01/2018 -No neck compression symptoms -No intervention is needed for now  3. HL - Reviewed latest lipid panel from 01/2018: LDL much improved compared to before, triglycerides slightly high, HDL slightly low Lab Results  Component Value Date   CHOL 159 01/18/2018   HDL 34.00 (L) 01/18/2018   LDLCALC 88 01/18/2018   LDLDIRECT 194.0 01/30/2017   TRIG 184.0 (H) 01/18/2018   CHOLHDL 5 01/18/2018  - Continues Lipitor without side effects.  Carlus Pavlov, MD PhD [facility]

## 2018-08-26 NOTE — Patient Instructions (Addendum)
Please continue: - Metformin 1000 mg 2x a day with meals. - Glipizide XL 10 mg in am.    Switch from Lantus to [place] 46 units daily.  Please restart: - Invokana 100 mg in am - Ozempic 0.5 mg weekly  Please return in 3 months with your sugar log.

## 2018-09-15 ENCOUNTER — Ambulatory Visit (INDEPENDENT_AMBULATORY_CARE_PROVIDER_SITE_OTHER): Payer: 59 | Admitting: Family Medicine

## 2018-09-15 ENCOUNTER — Encounter: Payer: Self-pay | Admitting: Family Medicine

## 2018-09-15 ENCOUNTER — Other Ambulatory Visit: Payer: Self-pay

## 2018-09-15 VITALS — Temp 98.3°F

## 2018-09-15 DIAGNOSIS — L03116 Cellulitis of left lower limb: Secondary | ICD-10-CM

## 2018-09-15 MED ORDER — CEPHALEXIN 500 MG PO CAPS: 500.0000 mg | ORAL_CAPSULE | Freq: Three times a day (TID) | ORAL | 0 refills | Status: DC

## 2018-09-15 MED ORDER — FLUCONAZOLE 150 MG PO TABS: 150.0000 mg | ORAL_TABLET | Freq: Once | ORAL | 1 refills | Status: AC

## 2018-09-15 NOTE — Progress Notes (Signed)
Virtual Visit via Video Note  I connected with pt on 09/15/18 at  3:30 PM EDT by a video enabled telemedicine application and verified that I am speaking with the correct person using two identifiers.  Location patient: home Location provider:work office Persons participating in the virtual visit: patient, provider  I discussed the limitations of evaluation and management by telemedicine and the availability of in person appointments. The patient expressed understanding and agreed to proceed.   HPI: Four of 5 days ago noted a little bump or scratch on R lower leg shin area. It itched and she started scratching it. It has since become red, warm to touch, throbbing pain in the area.   Describes pretibial region.  No fever or malaise. Denies hx of MRSA. Says she doesn't have any blisters or areas that feel fluctuant or have any drainage. She has hx of allergy (blistery rash on abdomen in remote past) to penicillins, but says she has not had any reaction to cephalexin in the past when I have rx'd it.  ROS: See pertinent positives and negatives per HPI.  Past Medical History:  Diagnosis Date  . Abnormal nuclear stress test 12/31/11   Low risk lexiscan, but left heart cath was recommended and this showed normal coronary arteries and normal LV function. (01/01/12)  . Anxiety    + ?mood disorder (awaiting old psych records as of 12/19/11.  . Carpal tunnel syndrome on both sides   . Chronic pain syndrome    low back pain  . Colon cancer screening    Pt did not return cologuard 10/2016.  She has chosen not to get colonoscopy as of 2018 f/u.  Pt did not return cologuard 09/2017.  Marland Kitchen Daytime somnolence    Nuvigil rx'd by her psychiatrist.  Pt reports that no sleep study has been done.  . DDD (degenerative disc disease), lumbar    Pt says she has hx of herniated disc that has resulted in some numbness in a few toes on left foot.  . 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  I have yet to see/hear any evidence of borderline personality d/o in her since she has been my pt.  . DM2 (diabetes mellitus, type 2) (HCC)    Managed by ENDO--Dr. Elvera Lennox.  +hx of GDM both pregnancies, dx'd with DM 2 in her 30s--denies hx of complications  with eyes, kidneys, nerves, or CV system.  . Hepatic steatosis   . History of vitamin D deficiency 2009  . Hyperlipidemia   . Hypertension   . Iron deficiency anemia 2013   Hx of: Likely occult upper GI bleeding.  Hb 8.6--came up to 12 at most recent check after getting on integra, which she now continues once daily.   iFOB was negative 01/13/12.  She says she had been taking excessive NSAIDs at the time.  She has never had a colonoscopy.  . Low back pain 12/19/2011  . Migraine syndrome   . Mixed urge and stress incontinence   . Multinodular goiter    Hx of multiple benign biopsies; repeated u/s's showed no change until the one on 04/2014.  FNA 06/2013 showed R and isthmus nodules intermediate so f/u bx 6 mo recommended.  Repeat bx 07/26/15 showed R lobe benign, isthmus atypical cells of undetermined signif,--detailed path  analysis was done and it was BENIGN.  U/S 11/2017 -->NO CHANGE in nodules.  . Obesity   . Plantar fasciitis    Steroid injection by podiatrist 09/2017  . Renal cyst, acquired, right 2017  . Toe fracture, left 09/2017   Distal phalanx L third toe (saw podiatrist)    Past Surgical History:  Procedure Laterality Date  . CARDIAC CATHETERIZATION  01/01/12   Normal coronaries and normal LV function.  Marland Kitchen CESAREAN SECTION     X 2  . FNA bx Thyroid nodules  06/29/14   x 4: some findings to support benign cyst and some "cellular atypica of indeterminate significance".--followed by Dr. Elvera Lennox.  Repeat bx 05/2015 BENIGN.    Family History  Problem Relation Age of Onset  .  Cancer Mother        breast and follicular thyroid cancer  . Depression Mother   . Breast cancer Mother   . Diabetes Father   . Alcohol abuse Sister   . Hypertension Maternal Grandmother   . Hyperlipidemia Maternal Grandmother   . Heart disease Maternal Grandmother   . Stroke Maternal Grandmother   . Cancer Maternal Grandfather        colon cancer  . Stroke Maternal Grandfather   . Hypertension Maternal Grandfather   . Hyperlipidemia Maternal Grandfather   . Heart disease Maternal Grandfather      Current Outpatient Medications:  .  aspirin 81 MG tablet, Take 81 mg by mouth daily., Disp: , Rfl:  .  atorvastatin (LIPITOR) 80 MG tablet, TAKE 1 TABLET BY MOUTH EVERY DAY, Disp: 90 tablet, Rfl: 0 .  busPIRone (BUSPAR) 5 MG tablet, TAKE 1/2 TABLET THREE TIMES A DAY, Disp: 90 tablet, Rfl: 0 .  butalbital-acetaminophen-caffeine (FIORICET, ESGIC) 50-325-40 MG tablet, take 1 to 2 tablets by mouth twice a day if needed for headache, Disp: 60 tablet, Rfl: 1 .  canagliflozin (INVOKANA) 100 MG TABS tablet, TAKE 1 TABLET BY MOUTH DAILY BEFORE THE FIRST MEAL OF THE DAY (Patient not taking: Reported on 08/26/2018), Disp: 30 tablet, Rfl: 11 .  dicyclomine (BENTYL) 10 MG capsule, 1 tab po tid prn abd bloating/cramping, Disp: 30 capsule, Rfl: 0 .  DULoxetine (CYMBALTA) 60 MG capsule, TAKE 1 CAPSULE BY MOUTH EVERY DAY, Disp: 30 capsule, Rfl: 5 .  glipiZIDE (GLUCOTROL XL) 5 MG 24 hr tablet, TAKE 2 TABLETS BY MOUTH ONCE DAILY WITH BREAKFAST, Disp: 60 tablet, Rfl: 6 .  glucose blood (ONETOUCH VERIO) test strip, Use as instructed to check sugar 2 times daily, Disp: 200 each, Rfl: 5 .  hydrochlorothiazide (HYDRODIURIL) 25 MG tablet, TAKE 1 TABLET BY MOUTH EVERY DAY, Disp: 30 tablet, Rfl: 5 .  HYDROcodone-acetaminophen (NORCO/VICODIN) 5-325 MG tablet, TAKE 1 TO 2 TABLETS EVERY 6 HOURS AS NEEDED FOR PAIN, Disp: 120 tablet, Rfl: 0 .  hydrocortisone (PROCTOSOL HC) 2.5 % rectal cream, apply rectally twice a day,  Disp: 30 g, Rfl: 6 .  Insulin Degludec (TRESIBA FLEXTOUCH) 200 UNIT/ML SOPN, Inject 46 Units into the skin daily., Disp: 3 pen, Rfl: 5 .  Insulin Glargine (LANTUS SOLOSTAR) 100 UNIT/ML Solostar Pen, INJECT 45 UNITS INTO THE SKIN DAILY AT 10 PM., Disp: 15 pen, Rfl: 3 .  Insulin Pen Needle 31G X 4 MM MISC, 1 each by Does not apply route at bedtime., Disp: 100 each, Rfl: 5 .  lamoTRIgine (LAMICTAL) 200 MG tablet, TAKE 1 TABLET BY MOUTH EVERY DAY, Disp: 30 tablet, Rfl: 6 .  Lancet Devices (ONE TOUCH DELICA LANCING DEV) MISC,  Use to check sugar 2 times daily, Disp: 200 each, Rfl: 5 .  lisinopril (PRINIVIL,ZESTRIL) 40 MG tablet, TAKE 1 TABLET BY MOUTH EVERY DAY, Disp: 90 tablet, Rfl: 1 .  LORazepam (ATIVAN) 0.5 MG tablet, TAKE 2 TABLETS BY MOUTH AT BEDTIME, Disp: 60 tablet, Rfl: 5 .  metFORMIN (GLUCOPHAGE) 1000 MG tablet, TAKE 1 TABLET BY MOUTH TWICE A DAY FOR DIABETES, Disp: 180 tablet, Rfl: 1 .  metoprolol succinate (TOPROL-XL) 25 MG 24 hr tablet, Take 1 tablet (25 mg total) by mouth daily., Disp: 30 tablet, Rfl: 3 .  Multiple Vitamin (MULTIVITAMIN) tablet, Take 1 tablet by mouth daily., Disp: , Rfl:  .  Multiple Vitamins-Minerals (PRESERVISION AREDS) CAPS, Take 1 capsule by mouth daily., Disp: , Rfl:  .  Omega-3 Fatty Acids (FISH OIL) 1000 MG CAPS, Take 1 capsule by mouth at bedtime., Disp: , Rfl:  .  omeprazole (PRILOSEC) 40 MG capsule, TAKE 1 CAPSULE BY MOUTH EVERY DAY, Disp: 30 capsule, Rfl: 5 .  oxybutynin (DITROPAN-XL) 10 MG 24 hr tablet, TAKE 1 TABLET BY MOUTH EVERY DAY, Disp: 30 tablet, Rfl: 5 .  promethazine (PHENERGAN) 12.5 MG tablet, 1-2 tabs po q6h prn nausea/vomiting, Disp: 20 tablet, Rfl: 3  EXAM:  VITALS per patient if applicable: Temp 98.3 F (36.8 C) (Oral)   LMP 06/04/2013    GENERAL: alert, oriented, appears well and in no acute distress  HEENT: atraumatic, conjunttiva clear, no obvious abnormalities on inspection of external nose and ears  NECK: normal movements of the  head and neck  LUNGS: on inspection no signs of respiratory distress, breathing rate appears normal, no obvious gross SOB, gasping or wheezing  CV: no obvious cyanosis  MS: moves all visible extremities without noticeable abnormality  PSYCH/NEURO: pleasant and cooperative, no obvious depression or anxiety, speech and thought processing grossly intact  Skin: pt sent a picture of the rash via text b/c she could not get the video function on her phone to get an adequate view. The texted picture shows: blotchy region of erythema covering most of pretibial region of L LL.  Focal 1 cm dry scab.  No exudate or bleeding.  Borders of the erythema are ill-defined.  ASSESSMENT AND PLAN:  Discussed the following assessment and plan:  Left lower leg cellulitis:  Although penicillin allergic, pt states she has tolerated cephalexin w/out problem in the past so I will rx cephalexin 500 mg tid x 7d. Diflucan 150mg  qd x 1 dose for yeast vaginitis that she invariably gets with EVERY course of antibiotics she takes.  I discussed the assessment and treatment plan with the patient. The patient was provided an opportunity to ask questions and all were answered. The patient agreed with the plan and demonstrated an understanding of the instructions.   The patient was advised to call back or seek an in-person evaluation if the symptoms worsen or if the condition fails to improve as anticipated.  F/u: prn/if not improving in 2-3 days.  Signed:  Santiago Bumpers, MD           09/15/2018

## 2018-09-17 ENCOUNTER — Telehealth: Payer: Self-pay

## 2018-09-17 NOTE — Telephone Encounter (Signed)
Copied from CRM [id number]. Topic: General - Other >> Sep 17, 2018 12:01 PM Kristi Guerrero wrote: Reason for CRM: Pt was told is she hasnt gotten any better to call the office/ no one answered at office, tried 3 times/ please advise    Pt was called and she states it is no better but not worse. 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 She stated she was going to take it easy tonight, she does not have to work, and will call in the morning if it is not better and get a appt for the urgent care. Pt was educated per Dr Verdis Frederickson notes and she verbalized understanding.

## 2018-09-29 ENCOUNTER — Other Ambulatory Visit: Payer: Self-pay | Admitting: Family Medicine

## 2018-09-29 ENCOUNTER — Telehealth: Payer: Self-pay | Admitting: Internal Medicine

## 2018-09-29 MED ORDER — LORAZEPAM 0.5 MG PO TABS: ORAL_TABLET | ORAL | 5 refills | Status: DC

## 2018-09-29 NOTE — Telephone Encounter (Signed)
MEDICATION: Ozempic 0.5 mg  PHARMACY:  [facility]  IS THIS A 90 DAY SUPPLY :   IS PATIENT OUT OF MEDICATION: Yes  IF NOT; HOW MUCH IS LEFT:   LAST APPOINTMENT DATE: @3 /05/2019  NEXT APPOINTMENT DATE:@6 /16/2020  DO WE HAVE YOUR PERMISSION TO LEAVE A DETAILED MESSAGE:  OTHER COMMENTS:    **Let patient know to contact pharmacy at the end of the day to make sure medication is ready. **  ** Please notify patient to allow 48-72 hours to process**  **Encourage patient to contact the pharmacy for refills or they can request refills through [facility]**

## 2018-09-29 NOTE — Telephone Encounter (Signed)
RF request for lorazepam from [facility].  Last OV 09/15/2018 Next OV 11/03/2018 Last RX 03/29/2018 # 60 x 5 RFS.   Please advise.

## 2018-10-01 MED ORDER — SEMAGLUTIDE(0.25 OR 0.5MG/DOS) 2 MG/1.5ML ~~LOC~~ SOPN: 0.5000 mg | PEN_INJECTOR | SUBCUTANEOUS | 4 refills | Status: DC

## 2018-10-01 NOTE — Telephone Encounter (Signed)
RX sent

## 2018-10-03 ENCOUNTER — Other Ambulatory Visit: Payer: Self-pay | Admitting: Internal Medicine

## 2018-10-04 ENCOUNTER — Other Ambulatory Visit: Payer: Self-pay | Admitting: Family Medicine

## 2018-10-26 ENCOUNTER — Other Ambulatory Visit: Payer: Self-pay | Admitting: Family Medicine

## 2018-10-27 ENCOUNTER — Telehealth: Payer: Self-pay

## 2018-10-27 NOTE — Telephone Encounter (Signed)
Pt wanted to know if her appt on 5/20 would be virtual or in office? Appt is for f/u [facility]  Please advise, thanks.

## 2018-10-27 NOTE — Telephone Encounter (Signed)
Whichever way she prefers is fine with me.-thx

## 2018-10-28 NOTE — Telephone Encounter (Signed)
MyChart message read.

## 2018-11-03 ENCOUNTER — Other Ambulatory Visit: Payer: Self-pay

## 2018-11-03 ENCOUNTER — Ambulatory Visit: Payer: 59 | Admitting: Family Medicine

## 2018-11-03 ENCOUNTER — Ambulatory Visit (INDEPENDENT_AMBULATORY_CARE_PROVIDER_SITE_OTHER): Payer: 59 | Admitting: Family Medicine

## 2018-11-03 ENCOUNTER — Encounter: Payer: Self-pay | Admitting: Family Medicine

## 2018-11-03 VITALS — BP 147/74 | Temp 98.7°F | Wt 237.2 lb

## 2018-11-03 DIAGNOSIS — E7800 Pure hypercholesterolemia, unspecified: Secondary | ICD-10-CM

## 2018-11-03 DIAGNOSIS — G894 Chronic pain syndrome: Secondary | ICD-10-CM

## 2018-11-03 DIAGNOSIS — I10 Essential (primary) hypertension: Secondary | ICD-10-CM

## 2018-11-03 DIAGNOSIS — M545 Low back pain: Secondary | ICD-10-CM

## 2018-11-03 DIAGNOSIS — M7551 Bursitis of right shoulder: Secondary | ICD-10-CM

## 2018-11-03 DIAGNOSIS — G8929 Other chronic pain: Secondary | ICD-10-CM

## 2018-11-03 NOTE — Progress Notes (Signed)
Virtual Visit via Video Note  I connected with Kristi Guerrero on 11/03/18 at  8:40 AM EDT by a video enabled telemedicine application and verified that I am speaking with the correct person using two identifiers.  Location patient: home Location provider:work or home office Persons participating in the virtual visit: patient, provider  I discussed the limitations of evaluation and management by telemedicine and the availability of in person appointments. The patient expressed understanding and agreed to proceed.  Telemedicine visit is a necessity given the COVID-19 restrictions in place at the current time.  HPI: 58 y/o WF being seen today for 3 mo f/u HTN, HLD, and chronic pain syndrome.  HTN: she is not checking bp much at all, recalls a bp of 100/70 fairly recently.  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   HLD: tolerating atorva  qd fine.  She admits that she does not focus enough on eating low fat/low chol/low carb diet.  Indication for chronic opioid:chronic bilat LBP w/out radiculopathy Medication and dose:Vicodin 5/325, 1-2q6h prn # pills per month: 120 but the last time she filled this med was 08/13/18. Last UDS date:08/05/2018. Opioid Treatment Agreement signed (Y/N): 04/21/2018. Opioid Treatment Agreement last reviewed with patient:today [facility] reviewed this encounter (include red flags):today, no red flags.  Pain update: getting some relief for shoulder with Kristi Guerrero. Has not been taking much in the way of opioids. Takes ibup or tyl about 3-4 times per week. Her back does hurt and having R sided sciatica.   ROS: See pertinent positives and negatives per HPI.  Past Medical History:  Diagnosis Date  . Abnormal nuclear stress test 12/31/11   Low risk lexiscan, but left heart cath was recommended and this showed normal coronary arteries and normal LV function. (01/01/12)  . Anxiety    + ?mood disorder (awaiting old psych records as of 12/19/11.  . Carpal  tunnel syndrome on both sides   . Chronic pain syndrome    low back pain  . Colon cancer screening    Kristi Guerrero did not return cologuard 10/2016.  She has chosen not to get colonoscopy as of 2018 f/u.  Kristi Guerrero did not return cologuard 09/2017.  Marland Kitchen Daytime somnolence    Nuvigil rx'd by her psychiatrist.  Kristi Guerrero reports that no sleep study has been done.  . DDD (degenerative disc disease), lumbar    Kristi Guerrero says she has hx of herniated disc that has resulted in some numbness in a few toes on left foot.  . 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  I have yet to see/hear any evidence of borderline personality d/o in her since she has been my Kristi Guerrero.  . DM2 (diabetes mellitus, type 2) (HCC)    Managed by ENDO--Dr. Elvera Lennox.  +hx of GDM both pregnancies, dx'd with DM 2 in her 30s--denies hx of complications  with eyes, kidneys, nerves, or CV system.  . Hepatic steatosis   . History of vitamin D deficiency 2009  . Hyperlipidemia   . Hypertension   . Iron deficiency anemia 2013   Hx of: Likely occult upper GI bleeding.  Hb 8.6--came up to 12 at most recent check after getting on integra, which she now continues once daily.   iFOB was negative 01/13/12.  She says she  had been taking excessive NSAIDs at the time.  She has never had a colonoscopy.  . Low back pain 12/19/2011  . Migraine syndrome   . Mixed urge and stress incontinence   . Multinodular goiter    Hx of multiple benign biopsies; repeated u/s's showed no change until the one on 04/2014.  FNA 06/2013 showed R and isthmus nodules intermediate so f/u bx 6 mo recommended.  Repeat bx 07/26/15 showed R lobe benign, isthmus atypical cells of undetermined signif,--detailed path analysis was done and it was BENIGN.  U/S 11/2017 -->NO CHANGE in nodules.  . Obesity   . Plantar fasciitis    Steroid  injection by podiatrist 09/2017  . Renal cyst, acquired, right 2017  . Toe fracture, left 09/2017   Distal phalanx L third toe (saw podiatrist)    Past Surgical History:  Procedure Laterality Date  . CARDIAC CATHETERIZATION  01/01/12   Normal coronaries and normal LV function.  Marland Kitchen CESAREAN SECTION     X 2  . FNA bx Thyroid nodules  06/29/14   x 4: some findings to support benign cyst and some "cellular atypica of indeterminate significance".--followed by Dr. Elvera Lennox.  Repeat bx 05/2015 BENIGN.    Family History  Problem Relation Age of Onset  . Cancer Mother        breast and follicular thyroid cancer  . Depression Mother   . Breast cancer Mother   . Diabetes Father   . Alcohol abuse Sister   . Hypertension Maternal Grandmother   . Hyperlipidemia Maternal Grandmother   . Heart disease Maternal Grandmother   . Stroke Maternal Grandmother   . Cancer Maternal Grandfather        colon cancer  . Stroke Maternal Grandfather   . Hypertension Maternal Grandfather   . Hyperlipidemia Maternal Grandfather   . Heart disease Maternal Grandfather      Current Outpatient Medications:  .  aspirin 81 MG tablet, Take 81 mg by mouth daily., Disp: , Rfl:  .  atorvastatin (LIPITOR) 80 MG tablet, TAKE 1 TABLET BY MOUTH EVERY DAY, Disp: 90 tablet, Rfl: 0 .  busPIRone (BUSPAR) 5 MG tablet, TAKE 1/2 TABLET THREE TIMES A DAY, Disp: 90 tablet, Rfl: 0 .  butalbital-acetaminophen-caffeine (FIORICET, ESGIC) 50-325-40 MG tablet, take 1 to 2 tablets by mouth twice a day if needed for headache, Disp: 60 tablet, Rfl: 1 .  dicyclomine (BENTYL) 10 MG capsule, 1 tab po tid prn abd bloating/cramping, Disp: 30 capsule, Rfl: 0 .  DULoxetine (CYMBALTA) 60 MG capsule, TAKE 1 CAPSULE BY MOUTH EVERY DAY, Disp: 30 capsule, Rfl: 5 .  glipiZIDE (GLUCOTROL XL) 5 MG 24 hr tablet, TAKE 2 TABLETS BY MOUTH ONCE DAILY WITH BREAKFAST, Disp: 60 tablet, Rfl: 6 .  glucose blood (ONETOUCH VERIO) test strip, Use as instructed to  check sugar 2 times daily, Disp: 200 each, Rfl: 5 .  hydrochlorothiazide (HYDRODIURIL) 25 MG tablet, TAKE 1 TABLET BY MOUTH EVERY DAY, Disp: 30 tablet, Rfl: 5 .  HYDROcodone-acetaminophen (NORCO/VICODIN) 5-325 MG tablet, TAKE 1 TO 2 TABLETS EVERY 6 HOURS AS NEEDED FOR PAIN, Disp: 120 tablet, Rfl: 0 .  hydrocortisone (PROCTOSOL HC) 2.5 % rectal cream, apply rectally twice a day, Disp: 30 g, Rfl: 6 .  Insulin Degludec (TRESIBA FLEXTOUCH) 200 UNIT/ML SOPN, Inject 46 Units into the skin daily., Disp: 3 pen, Rfl: 5 .  Insulin Pen Needle 31G X 4 MM MISC, 1 each by Does not apply route at bedtime., Disp: 100 each, Rfl: 5 .  lamoTRIgine (LAMICTAL) 200 MG tablet, TAKE 1 TABLET BY MOUTH EVERY DAY, Disp: 30 tablet, Rfl: 6 .  Lancet Devices (ONE TOUCH DELICA LANCING DEV) MISC, Use to check sugar 2 times daily, Disp: 200 each, Rfl: 5 .  lisinopril (PRINIVIL,ZESTRIL) 40 MG tablet, TAKE 1 TABLET BY MOUTH EVERY DAY, Disp: 90 tablet, Rfl: 1 .  LORazepam (ATIVAN) 0.5 MG tablet, TAKE 2 TABLETS BY MOUTH AT BEDTIME, Disp: 60 tablet, Rfl: 5 .  metFORMIN (GLUCOPHAGE) 1000 MG tablet, TAKE 1 TABLET BY MOUTH TWICE A DAY FOR DIABETES, Disp: 180 tablet, Rfl: 1 .  metoprolol succinate (TOPROL-XL) 25 MG 24 hr tablet, Take 1 tablet (25 mg total) by mouth daily., Disp: 30 tablet, Rfl: 3 .  Multiple Vitamin (MULTIVITAMIN) tablet, Take 1 tablet by mouth daily., Disp: , Rfl:  .  Multiple Vitamins-Minerals (PRESERVISION AREDS) CAPS, Take 1 capsule by mouth daily., Disp: , Rfl:  .  Omega-3 Fatty Acids (FISH OIL) 1000 MG CAPS, Take 1 capsule by mouth at bedtime., Disp: , Rfl:  .  omeprazole (PRILOSEC) 40 MG capsule, TAKE 1 CAPSULE BY MOUTH EVERY DAY, Disp: 30 capsule, Rfl: 5 .  oxybutynin (DITROPAN-XL) 10 MG 24 hr tablet, TAKE 1 TABLET BY MOUTH EVERY DAY, Disp: 30 tablet, Rfl: 5 .  promethazine (PHENERGAN) 12.5 MG tablet, 1-2 tabs po q6h prn nausea/vomiting, Disp: 20 tablet, Rfl: 3 .  Semaglutide,0.25 or 0.5MG /DOS, (OZEMPIC, 0.25 OR  0.5 MG/DOSE,) 2 MG/1.5ML SOPN, Inject 0.5 mg into the skin once a week., Disp: 3 pen, Rfl: 4 .  canagliflozin (INVOKANA) 100 MG TABS tablet, TAKE 1 TABLET BY MOUTH DAILY BEFORE THE FIRST MEAL OF THE DAY (Patient not taking: Reported on 11/03/2018), Disp: 30 tablet, Rfl: 11  EXAM:  VITALS per patient if applicable: BP (!) 147/74 (BP Location: Left Arm, Patient Position: Sitting, Cuff Size: Large)   Temp 98.7 F (37.1 C) (Oral)   Wt 237 lb 3.2 oz (107.6 kg)   LMP 06/04/2013   BMI 39.47 kg/m    GENERAL: alert, oriented, appears well and in no acute distress  HEENT: atraumatic, conjunttiva clear, no obvious abnormalities on inspection of external nose and ears  NECK: normal movements of the head and neck  LUNGS: on inspection no signs of respiratory distress, breathing rate appears normal, no obvious gross SOB, gasping or wheezing  CV: no obvious cyanosis  MS: moves all visible extremities without noticeable abnormality  PSYCH/NEURO: pleasant and cooperative, no obvious depression or anxiety, speech and thought processing grossly intact  LABS: none today    Chemistry      Component Value Date/Time   NA 135 08/05/2018 0929   K 4.1 08/05/2018 0929   CL 101 08/05/2018 0929   CO2 26 08/05/2018 0929   BUN 24 (H) 08/05/2018 0929   CREATININE 0.84 08/05/2018 0929   CREATININE 0.67 07/31/2017 1621      Component Value Date/Time   CALCIUM 8.9 08/05/2018 0929   ALKPHOS 95 01/18/2018 0905   AST 10 01/18/2018 0905   ALT 15 01/18/2018 0905   BILITOT 0.4 01/18/2018 0905     Lab Results  Component Value Date   CHOL 159 01/18/2018   HDL 34.00 (L) 01/18/2018   LDLCALC 88 01/18/2018   LDLDIRECT 194.0 01/30/2017   TRIG 184.0 (H) 01/18/2018   CHOLHDL 5 01/18/2018   Lab Results  Component Value Date   TSH 1.34 01/18/2018   Lab Results  Component Value Date   WBC 4.8 01/18/2018   HGB 11.9 (L) 01/18/2018  HCT 35.8 (L) 01/18/2018   MCV 84.9 01/18/2018   PLT 255.0 01/18/2018    Lab Results  Component Value Date   HGBA1C 11.6 (A) 08/26/2018    ASSESSMENT AND PLAN:  Discussed the following assessment and plan:  1) Chronic pain syndrome: low back pain with radiculopathy-->this bothers her quite a bit but she is not treating it that much with pain meds.  No rx for vicodin was needed today.  Her CSC and UDS are UTD. Her R shoulder pain from bursitis/tendonitis is improving with Kristi Guerrero.  2) HTN: bp a little elevated today, but recent one was 100/70.  With this info I'm hesitant to change med. Encouraged her to check bp more often at work or home so we can get more data to go on. Monitor bp bid on three days per week, call if avg >140/90. She knows she needs to work harder on diet/exercise/wt loss. BMET --future.  3) HLD: tolerating statin. FLP --future.  4) DM 2: continue appropriate f/u with Dr. [name]. No change in meds today.  5) Right shoulder subacromial bursitis: improved some with my steroid injection  3 mo ago but she has gotten much more improvement with Kristi Guerrero of late.  Continue Kristi Guerrero.  I discussed the assessment and treatment plan with the patient. The patient was provided an opportunity to ask questions and all were answered. The patient agreed with the plan and demonstrated an understanding of the instructions.   The patient was advised to call back or seek an in-person evaluation if the symptoms worsen or if the condition fails to improve as anticipated.  F/u: 3 mo [facility]  Signed:  Santiago BumpersPhil , MD           11/03/2018

## 2018-11-15 ENCOUNTER — Ambulatory Visit: Payer: 59

## 2018-11-30 ENCOUNTER — Ambulatory Visit: Payer: Self-pay | Admitting: Internal Medicine

## 2018-11-30 ENCOUNTER — Ambulatory Visit: Payer: 59 | Admitting: Internal Medicine

## 2018-12-13 ENCOUNTER — Ambulatory Visit: Payer: Self-pay | Admitting: Internal Medicine

## 2019-02-09 ENCOUNTER — Ambulatory Visit: Payer: 59 | Admitting: Family Medicine

## 2019-03-17 ENCOUNTER — Ambulatory Visit: Payer: 59 | Admitting: Physician Assistant

## 2019-03-17 ENCOUNTER — Encounter: Payer: Self-pay | Admitting: Family Medicine

## 2019-03-17 ENCOUNTER — Telehealth: Payer: Self-pay | Admitting: Family Medicine

## 2019-03-17 ENCOUNTER — Ambulatory Visit (INDEPENDENT_AMBULATORY_CARE_PROVIDER_SITE_OTHER): Payer: 59 | Admitting: Family Medicine

## 2019-03-17 VITALS — BP 145/80 | HR 67 | Temp 97.8°F | Ht 65.0 in | Wt 228.5 lb

## 2019-03-17 DIAGNOSIS — M25561 Pain in right knee: Secondary | ICD-10-CM

## 2019-03-17 MED ORDER — LISINOPRIL 40 MG PO TABS: 40.0000 mg | ORAL_TABLET | Freq: Every day | ORAL | 0 refills | Status: DC

## 2019-03-17 MED ORDER — METHYLPREDNISOLONE ACETATE 80 MG/ML IJ SUSP
80.0000 mg | Freq: Once | INTRAMUSCULAR | Status: AC
  Administered 2019-03-17: 80 mg via INTRA_ARTICULAR

## 2019-03-17 MED ORDER — MELOXICAM 7.5 MG PO TABS: 7.5000 mg | ORAL_TABLET | Freq: Every day | ORAL | 0 refills | Status: DC

## 2019-03-17 NOTE — Addendum Note (Signed)
Addended by: Loralyn Freshwater on: 03/17/2019 11:41 AM   Modules accepted: Orders
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
## 2019-03-17 NOTE — Progress Notes (Signed)
   Chief Complaint:  Kristi Guerrero is a 58 y.o. female who presents for same day appointment with a chief complaint of knee pain.   Assessment/Plan:  Knee Pain Consistent with OA flare.  No red flags.  Gave intra-articular steroid injection today - tolerated well, see below.  Discussed possibility of increasing blood sugar.  Patient works as a Marine scientist and stated that she would keep a close eye on her blood sugars over the next few days.  Also start Mobic 7.5 mg daily.  Recommended compression and ice.  Discussed reasons to return to care.  Follow-up as needed.     Subjective:  HPI:  Knee Pain, acute problem Started a few months ago. Worsened over the past few weeks. Tried taking ibuprofen which has not helped. Tried tylenol which did not help. Located in right knee. No recent falls or other injuries. No obvious alleviating or aggravating factors. Works night shift as a Marine scientist. Mild swelling. No redness. No recent illnesses. No fevers or chills.   ROS: Per HPI  PMH: She reports that she has never smoked. She has never used smokeless tobacco. She reports that she does not drink alcohol or use drugs.      Objective:  Physical Exam: BP (!) 145/80   Pulse 67   Temp 97.8 F (36.6 C)   Ht 5\' 5"  (1.651 m)   Wt 228 lb 8 oz (103.6 kg)   LMP 06/04/2013   SpO2 100%   BMI 38.02 kg/m   Gen: NAD, resting comfortably MSK: -Right knee: No deformities.  Mild effusion noted.  Tender to palpation along medial and lateral joint lines.  Crepitus with active range of motion.  Neurovascular intact distally.  Knee Injection Procedure Note  Indication: Symptom relief of right Knee Pain.  Procedure Details  Verbal consent was obtained for the procedure. The joint was prepped with Betadine. Topical ethyl chloride was applied for anesthesia. A 22 gauge needle was inserted into the medial aspect of the joint.  2 ml 1% lidocaine and 1 ml of 80mg /cc Depo-Medrol was then injected into the joint. The needle was  removed and the area cleansed and dressed.  Complications:  None; patient tolerated the procedure well.        Algis Greenhouse. Jerline Pain, MD 03/17/2019 10:54 AM

## 2019-03-17 NOTE — Telephone Encounter (Signed)
Lisinopril sent in # 90, patient overdue for follow up.  Patient aware she needs an appointment for further refills.

## 2019-03-17 NOTE — Patient Instructions (Addendum)
It was very nice to see you today!  You are having an arthritis flare in your knee.  Please use compression and ice.  Use ice for 10 to 15 minutes 2-3 times per day.  Use compression as much as you can tolerate.  We gave you a cortisone shot today.  Please use the mobic if needed.  Let me know or your PCP know if your symptoms worsen or do not improve in the next few days.   Take care, Dr Jerline Pain

## 2019-03-17 NOTE — Telephone Encounter (Signed)
2nd request  Refill request  lisinopril (PRINIVIL,ZESTRIL) 40 MG tablet [[id number]  [id number] - [place],  - [address]

## 2019-04-09 ENCOUNTER — Other Ambulatory Visit: Payer: Self-pay | Admitting: Family Medicine

## 2019-06-13 ENCOUNTER — Telehealth: Payer: Self-pay | Admitting: Family Medicine

## 2019-06-13 MED ORDER — FLUCONAZOLE 150 MG PO TABS: ORAL_TABLET | ORAL | 1 refills | Status: DC

## 2019-06-13 NOTE — Telephone Encounter (Signed)
Pt's last visit was 11/03/18 for f/u chronic pain. Advised to f/u 3 mo. Fluconazole is no longer on pt's current med list, last given 09/15/18 (1,1)   Please advise if appropriate for refill, thanks.

## 2019-06-13 NOTE — Telephone Encounter (Signed)
Patient reports she has a bad yeast infection. Patient requesting refill. She is requesting #2 tabs.  fluconazole (DIFLUCAN) 150 MG tablet  quantity 2   [id number] - [place], [place]  Patient can be reached at [phone]

## 2019-06-13 NOTE — Telephone Encounter (Signed)
Ok, fluconazole eRx'd

## 2019-06-13 NOTE — Telephone Encounter (Signed)
Pt was notified, Rx sent. 

## 2019-06-17 DIAGNOSIS — M1990 Unspecified osteoarthritis, unspecified site: Secondary | ICD-10-CM

## 2019-06-17 HISTORY — DX: Unspecified osteoarthritis, unspecified site: M19.90

## 2019-06-20 ENCOUNTER — Other Ambulatory Visit: Payer: Self-pay

## 2019-06-21 ENCOUNTER — Encounter: Payer: Self-pay | Admitting: Family Medicine

## 2019-06-21 ENCOUNTER — Ambulatory Visit: Payer: 59 | Admitting: Family Medicine

## 2019-06-21 ENCOUNTER — Ambulatory Visit (HOSPITAL_BASED_OUTPATIENT_CLINIC_OR_DEPARTMENT_OTHER)
Admission: RE | Admit: 2019-06-21 | Discharge: 2019-06-21 | Disposition: A | Discharge status: Home / Self Care | Payer: 59 | Source: Ambulatory Visit | Attending: Family Medicine | Admitting: Family Medicine

## 2019-06-21 ENCOUNTER — Telehealth: Payer: Self-pay

## 2019-06-21 VITALS — BP 125/81 | HR 84 | Temp 97.7°F | Resp 16 | Ht 65.0 in | Wt 216.2 lb

## 2019-06-21 DIAGNOSIS — E7800 Pure hypercholesterolemia, unspecified: Secondary | ICD-10-CM | POA: Diagnosis not present

## 2019-06-21 DIAGNOSIS — M25561 Pain in right knee: Secondary | ICD-10-CM

## 2019-06-21 DIAGNOSIS — M5440 Lumbago with sciatica, unspecified side: Secondary | ICD-10-CM

## 2019-06-21 DIAGNOSIS — G894 Chronic pain syndrome: Secondary | ICD-10-CM

## 2019-06-21 DIAGNOSIS — I10 Essential (primary) hypertension: Secondary | ICD-10-CM | POA: Diagnosis not present

## 2019-06-21 DIAGNOSIS — Z23 Encounter for immunization: Secondary | ICD-10-CM | POA: Diagnosis not present

## 2019-06-21 DIAGNOSIS — G8929 Other chronic pain: Secondary | ICD-10-CM

## 2019-06-21 DIAGNOSIS — Z9114 Patient's other noncompliance with medication regimen: Secondary | ICD-10-CM

## 2019-06-21 LAB — CBC WITH DIFFERENTIAL/PLATELET
Basophils Absolute: 0 10*3/uL (ref 0.0–0.1)
Basophils Relative: 0.5 % (ref 0.0–3.0)
Eosinophils Absolute: 0 10*3/uL (ref 0.0–0.7)
Eosinophils Relative: 0.7 % (ref 0.0–5.0)
HCT: 45.4 % (ref 36.0–46.0)
Hemoglobin: 15.5 g/dL — ABNORMAL HIGH (ref 12.0–15.0)
Lymphocytes Relative: 37.9 % (ref 12.0–46.0)
Lymphs Abs: 2.3 10*3/uL (ref 0.7–4.0)
MCHC: 34.1 g/dL (ref 30.0–36.0)
MCV: 91 fl (ref 78.0–100.0)
Monocytes Absolute: 0.4 10*3/uL (ref 0.1–1.0)
Monocytes Relative: 6.6 % (ref 3.0–12.0)
Neutro Abs: 3.2 10*3/uL (ref 1.4–7.7)
Neutrophils Relative %: 54.3 % (ref 43.0–77.0)
Platelets: 218 10*3/uL (ref 150.0–400.0)
RBC: 4.99 Mil/uL (ref 3.87–5.11)
RDW: 13.4 % (ref 11.5–15.5)
WBC: 5.9 10*3/uL (ref 4.0–10.5)

## 2019-06-21 LAB — COMPREHENSIVE METABOLIC PANEL
ALT: 10 U/L (ref 0–35)
AST: 10 U/L (ref 0–37)
Albumin: 4.2 g/dL (ref 3.5–5.2)
Alkaline Phosphatase: 123 U/L — ABNORMAL HIGH (ref 39–117)
BUN: 11 mg/dL (ref 6–23)
CO2: 28 mEq/L (ref 19–32)
Calcium: 9.4 mg/dL (ref 8.4–10.5)
Chloride: 96 mEq/L (ref 96–112)
Creatinine, Ser: 0.62 mg/dL (ref 0.40–1.20)
GFR: 98.56 mL/min (ref >60.00)
Glucose, Bld: 309 mg/dL — ABNORMAL HIGH (ref 70–99)
Potassium: 3.9 mEq/L (ref 3.5–5.1)
Sodium: 135 mEq/L (ref 135–145)
Total Bilirubin: 0.6 mg/dL (ref 0.2–1.2)
Total Protein: 7.3 g/dL (ref 6.0–8.3)

## 2019-06-21 LAB — LDL CHOLESTEROL, DIRECT: Direct LDL: 317 mg/dL

## 2019-06-21 LAB — LIPID PANEL
Cholesterol: 418 mg/dL — ABNORMAL HIGH (ref 0–200)
HDL: 40.7 mg/dL (ref >39.00)
NonHDL: 377.67
Total CHOL/HDL Ratio: 10
Triglycerides: 331 mg/dL — ABNORMAL HIGH (ref 0.0–149.0)
VLDL: 66.2 mg/dL — ABNORMAL HIGH (ref 0.0–40.0)

## 2019-06-21 LAB — TSH: TSH: 1 u[IU]/mL (ref 0.35–4.50)

## 2019-06-21 MED ORDER — LORAZEPAM 0.5 MG PO TABS: ORAL_TABLET | ORAL | 5 refills | Status: DC

## 2019-06-21 MED ORDER — LISINOPRIL 40 MG PO TABS: 40.0000 mg | ORAL_TABLET | Freq: Every day | ORAL | 3 refills | Status: DC

## 2019-06-21 MED ORDER — HYDROCODONE-ACETAMINOPHEN 5-325 MG PO TABS: ORAL_TABLET | ORAL | 0 refills | Status: DC

## 2019-06-21 MED ORDER — MELOXICAM 7.5 MG PO TABS: 7.5000 mg | ORAL_TABLET | Freq: Every day | ORAL | 3 refills | Status: DC

## 2019-06-21 MED ORDER — DULOXETINE HCL 30 MG PO CPEP: 30.0000 mg | ORAL_CAPSULE | Freq: Every day | ORAL | 0 refills | Status: DC

## 2019-06-21 NOTE — Progress Notes (Signed)
OFFICE VISIT  06/21/2019   CC:  Chief Complaint  Patient presents with  . Follow-up    pt is fasting     HPI:    Patient is a 59 y.o. Caucasian female who presents for f/u HTN, HLD, and chronic pain syndrome. A/P as of last visit 11/03/18: "1) Chronic pain syndrome: low back pain with radiculopathy-->this bothers her quite a bit but she is not treating it that much with pain meds.  No rx for vicodin was needed today.  Her CSC and UDS are UTD. Her R shoulder pain from bursitis/tendonitis is improving with PT.  2) HTN: bp a little elevated today, but recent one was 100/70.  With this info I'm hesitant to change med. Encouraged her to check bp more often at work or home so we can get more data to go on. Monitor bp bid on three days per week, call if avg >140/90. She knows she needs to work harder on diet/exercise/wt loss. BMET --future.  3) HLD: tolerating statin. FLP --future.  4) DM 2: continue appropriate f/u with Dr. Elvera Lennox. No change in meds today.  5) Right shoulder subacromial bursitis: improved some with my steroid injection 3 mo ago but she has gotten much more improvement with PT of late.  Continue PT."  Interim hx:  PMP AWARE reviewed today: most recent rx for lorazepam was filled 09/29/18, # 60, rx by me. Most recent hydrocodone rx fill was 08/13/18, #120, rx by me. PT HAS BEEN GETTING BUPRENORPHINE RX'D BY [facility]asia in [place], [place] for her ill dog.  Pt states she has not been taking any of her meds the last 4 mo except occ hydrocodone and occ fioricet.  Has not done appropriate f/u with Dr. Elvera Lennox for her DM. Says lots of stress going on, working double shifts, not getting out b/c of depression and just wanting to stay in bed.  No SI or HI.  She is irritable and then feels guilty.  +Anhedonia, poor concentration, poor sleep, appetite and wt are down. Her older son has moved near and this causes extra stress on her. Taking hydrocodone some for her  back. PT had been going to PT for her shoulder and had felt this was very helpful.  BP's at work have sometimes been 160-170 over 90s, some more near normal. Saw Dr. Jacquiline Doe 03/17/19 for R knee pain acute on chronic, got steroid injection.   Injection did help but only for a few days.  Mobic helped some.  It is swollen and hurts a lot, "10/10" intensity. Throbs constantly when sitting or walking.  Says swelling some.  No redness or heat.   No injury prior. Has taken 1.5 hydrocone at a time.  Sometimes a mobic with this helps. She has been working so much that resting knee has been impossible.  Review of Systems  Constitutional: Positive for fatigue.  HENT: Negative for congestion and sore throat.   Eyes: Negative for visual disturbance.  Respiratory: Negative for cough and shortness of breath.   Cardiovascular: Negative for chest pain, palpitations and leg swelling.  Gastrointestinal: Negative for abdominal pain, blood in stool and nausea.  Genitourinary: Negative for dysuria and urgency.  Musculoskeletal: Negative for back pain and joint swelling.  Skin: Negative for color change, pallor and rash.  Neurological: Negative for dizziness, tremors, syncope, weakness and headaches.  Hematological: Negative for adenopathy.  Psychiatric/Behavioral: Positive for decreased concentration, dysphoric mood and sleep disturbance. Negative for confusion, hallucinations, self-injury and suicidal  ideas. The patient is nervous/anxious.     Past Medical History:  Diagnosis Date  . Abnormal nuclear stress test 12/31/11   Low risk lexiscan, but left heart cath was recommended and this showed normal coronary arteries and normal LV function. (01/01/12)  . Anxiety    + ?mood disorder (awaiting old psych records as of 12/19/11.  . Carpal tunnel syndrome on both sides   . Chronic pain syndrome    low back pain  . Colon cancer screening    Pt did not return cologuard 10/2016.  She has chosen not to get  colonoscopy as of 2018 f/u.  Pt did not return cologuard 09/2017.  Marland Kitchen. Daytime somnolence    Nuvigil rx'd by her psychiatrist.  Pt reports that no sleep study has been done.  . DDD (degenerative disc disease), lumbar    Pt says she has hx of herniated disc that has resulted in some numbness in a few toes on left foot.  . 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  I have yet to see/hear any evidence of borderline personality d/o in her since she has been my pt.  . DM2 (diabetes mellitus, type 2) ([facility])    Managed by ENDO--Dr. [name].  +hx of GDM both pregnancies, dx'd with DM 2 in her 30s--denies hx of complications  with eyes, kidneys, nerves, or CV system.  . Hepatic steatosis   . History of vitamin D deficiency 2009  . Hyperlipidemia   . Hypertension   . Iron deficiency anemia 2013   Hx of: Likely occult upper GI bleeding.  Hb 8.6--came up to 12 at most recent check after getting on integra, which she now continues once daily.   iFOB was negative 01/13/12.  She says she had been taking excessive NSAIDs at the time.  She has never had a colonoscopy.  . Low back pain 12/19/2011  . Migraine syndrome   . Mixed urge and stress incontinence   . Multinodular goiter    Hx of multiple benign biopsies; repeated u/s's showed no change until the one on 04/2014.  FNA 06/2013 showed R and isthmus nodules intermediate so f/u bx 6 mo recommended.  Repeat bx 07/26/15 showed R lobe benign, isthmus atypical cells of undetermined signif,--detailed path analysis was done and it was BENIGN.  U/S 11/2017 -->NO CHANGE in nodules.  . Obesity   . Osteoarthritis 06/2019   tricompartmental, R knee->pt to see ortho  . Plantar fasciitis    Steroid injection by podiatrist 09/2017  . Renal cyst, acquired, right 2017  . Toe fracture, left 09/2017    Distal phalanx L third toe (saw podiatrist)    Past Surgical History:  Procedure Laterality Date  . CARDIAC CATHETERIZATION  01/01/12   Normal coronaries and normal LV function.  Marland Kitchen. CESAREAN SECTION     X 2  . FNA bx Thyroid nodules  06/29/14   x 4: some findings to support benign cyst and some "cellular atypica of indeterminate significance".--followed by Dr. [name].  Repeat bx 05/2015 BENIGN.    Outpatient Medications Prior to Visit  Medication Sig Dispense Refill  . HYDROcodone-acetaminophen (NORCO/VICODIN) 5-325 MG tablet TAKE 1 TO 2 TABLETS EVERY 6 HOURS AS NEEDED FOR PAIN 120 tablet 0  . aspirin 81 MG tablet Take 81 mg by mouth daily.    .Marland Kitchen  atorvastatin (LIPITOR) 80 MG tablet TAKE 1 TABLET BY MOUTH EVERY DAY (Patient not taking: Reported on 03/17/2019) 90 tablet 0  . busPIRone (BUSPAR) 5 MG tablet TAKE 1/2 TABLET THREE TIMES A DAY (Patient not taking: Reported on 03/17/2019) 90 tablet 0  . butalbital-acetaminophen-caffeine (FIORICET, ESGIC) 50-325-40 MG tablet take 1 to 2 tablets by mouth twice a day if needed for headache (Patient not taking: Reported on 03/17/2019) 60 tablet 1  . canagliflozin (INVOKANA) 100 MG TABS tablet TAKE 1 TABLET BY MOUTH DAILY BEFORE THE FIRST MEAL OF THE DAY (Patient not taking: Reported on 03/17/2019) 30 tablet 11  . dicyclomine (BENTYL) 10 MG capsule 1 tab po tid prn abd bloating/cramping (Patient not taking: Reported on 03/17/2019) 30 capsule 0  . DULoxetine (CYMBALTA) 60 MG capsule TAKE 1 CAPSULE BY MOUTH EVERY DAY (Patient not taking: Reported on 03/17/2019) 30 capsule 5  . glipiZIDE (GLUCOTROL XL) 5 MG 24 hr tablet TAKE 2 TABLETS BY MOUTH ONCE DAILY WITH BREAKFAST (Patient not taking: Reported on 03/17/2019) 60 tablet 6  . glucose blood (ONETOUCH VERIO) test strip Use as instructed to check sugar 2 times daily (Patient not taking: Reported on 03/17/2019) 200 each 5  . hydrochlorothiazide (HYDRODIURIL) 25 MG tablet TAKE 1 TABLET BY MOUTH EVERY DAY (Patient not  taking: Reported on 03/17/2019) 30 tablet 5  . hydrocortisone (PROCTOSOL HC) 2.5 % rectal cream apply rectally twice a day (Patient not taking: Reported on 03/17/2019) 30 g 6  . Insulin Degludec (TRESIBA FLEXTOUCH) 200 UNIT/ML SOPN Inject 46 Units into the skin daily. (Patient not taking: Reported on 03/17/2019) 3 pen 5  . Insulin Pen Needle 31G X 4 MM MISC 1 each by Does not apply route at bedtime. (Patient not taking: Reported on 03/17/2019) 100 each 5  . lamoTRIgine (LAMICTAL) 200 MG tablet TAKE 1 TABLET BY MOUTH EVERY DAY (Patient not taking: Reported on 03/17/2019) 30 tablet 6  . Lancet Devices (ONE TOUCH DELICA LANCING DEV) MISC Use to check sugar 2 times daily (Patient not taking: Reported on 03/17/2019) 200 each 5  . metFORMIN (GLUCOPHAGE) 1000 MG tablet TAKE 1 TABLET BY MOUTH TWICE A DAY FOR DIABETES (Patient not taking: Reported on 03/17/2019) 180 tablet 1  . metoprolol succinate (TOPROL-XL) 25 MG 24 hr tablet Take 1 tablet (25 mg total) by mouth daily. (Patient not taking: Reported on 03/17/2019) 30 tablet 3  . Multiple Vitamin (MULTIVITAMIN) tablet Take 1 tablet by mouth daily.    . Multiple Vitamins-Minerals (PRESERVISION AREDS) CAPS Take 1 capsule by mouth daily.    . Omega-3 Fatty Acids (FISH OIL) 1000 MG CAPS Take 1 capsule by mouth at bedtime.    Marland Kitchen omeprazole (PRILOSEC) 40 MG capsule TAKE 1 CAPSULE BY MOUTH EVERY DAY (Patient not taking: Reported on 03/17/2019) 30 capsule 5  . oxybutynin (DITROPAN-XL) 10 MG 24 hr tablet TAKE 1 TABLET BY MOUTH EVERY DAY (Patient not taking: Reported on 03/17/2019) 30 tablet 5  . promethazine (PHENERGAN) 12.5 MG tablet 1-2 tabs po q6h prn nausea/vomiting (Patient not taking: Reported on 03/17/2019) 20 tablet 3  . Semaglutide,0.25 or 0.5MG /DOS, (OZEMPIC, 0.25 OR 0.5 MG/DOSE,) 2 MG/1.5ML SOPN Inject 0.5 mg into the skin once a week. (Patient not taking: Reported on 03/17/2019) 3 pen 4  . fluconazole (DIFLUCAN) 150 MG tablet 1 tab po qd x 2d 2 tablet 1  . lisinopril  (ZESTRIL) 40 MG tablet Take 1 tablet (40 mg total) by mouth daily. (Patient not taking: Reported on 06/21/2019) 90 tablet 0  .  LORazepam (ATIVAN) 0.5 MG tablet TAKE 2 TABLETS BY MOUTH AT BEDTIME (Patient not taking: Reported on 03/17/2019) 60 tablet 5  . meloxicam (MOBIC) 7.5 MG tablet Take 1 tablet (7.5 mg total) by mouth daily. (Patient not taking: Reported on 06/21/2019) 30 tablet 0   No facility-administered medications prior to visit.    Allergies  Allergen Reactions  . Levofloxacin In D5w Hives  . Penicillins     Blistery rash  . Victoza [Liraglutide] Nausea Only    ROS As per HPI  PE: Blood pressure 125/81, pulse 84, temperature 97.7 F (36.5 C), temperature source Temporal, resp. rate 16, height 5\' 5"  (1.651 m), weight 216 lb 3.2 oz (98.1 kg), last menstrual period 06/04/2013, SpO2 99 %. Body mass index is 35.98 kg/m.  R knee: no warmth, erythema, or obvious swelling or effusion.  ROM limited to a bit past 90 deg flexion on R due to bad pain. Stress on medial collateral ligament elicits moderate pain but no significant laxity.  She has some TTP with patellar grind and over patella. Palpation of peripatellar region particularly on medial side.  No joint line tenderness.  Neg lachmann's and post drawer. Has TTP over pes anserine bursa on L.  No LL edema.    LABS:  Lab Results  Component Value Date   TSH 1.00 06/21/2019   Lab Results  Component Value Date   WBC 5.9 06/21/2019   HGB 15.5 (H) 06/21/2019   HCT 45.4 06/21/2019   MCV 91.0 06/21/2019   PLT 218.0 06/21/2019   Lab Results  Component Value Date   CREATININE 0.62 06/21/2019   BUN 11 06/21/2019   NA 135 06/21/2019   K 3.9 06/21/2019   CL 96 06/21/2019   CO2 28 06/21/2019   Lab Results  Component Value Date   ALT 10 06/21/2019   AST 10 06/21/2019   ALKPHOS 123 (H) 06/21/2019   BILITOT 0.6 06/21/2019   Lab Results  Component Value Date   CHOL 418 (H) 06/21/2019   Lab Results  Component Value Date    HDL 40.70 06/21/2019   Lab Results  Component Value Date   LDLCALC 88 01/18/2018   Lab Results  Component Value Date   TRIG 331.0 (H) 06/21/2019   Lab Results  Component Value Date   CHOLHDL 10 06/21/2019   Lab Results  Component Value Date   HGBA1C 11.6 (A) 08/26/2018    IMPRESSION AND PLAN:  1) Medical noncompliance regarding all her meds for her chronic dz's (DM, HTN, HLD, mood disorder, GERD, OAB, anxiety, chronic pain syndrome). CBC, CMET, FLP today.  She'll get her Hba1c at Dr. Arman Filter office 07/27/18. Restart all meds at previous doses EXCEPT start duloxetine at 30mg  qd dosing for 7d before resuming 60mg  qd dose AND take only 100 mg lamictal x 2 wks before increasing to previous dosing of 200 mg qd. Lorazepam and vicodin RF's eRx'd today--see orders.  CSC updated today. Will repeat UDS after 07/2019.  2) R knee pain: some chronic/episodic pain in the past but her acute pain the last 4-5 mo has been more severe and unrelenting.  Steroid injection about 3 mo ago helped but just for short period (few days). Exam today suggestive of some prepatellar bursitis +/- patellofemoral arthritis as well as MCL strain and pes anserine bursitis.  A knee sleeve actually causes her knee to feel worse.  Encourage frequent use of ice, relative rest, elevate, and she can continue mobic 7.5mg  qd as well as her vicodin  which she uses sparingly.  Knee x-ray ordered today.  Ref to ortho-pt to call back with preferred one-->pt called back after appt to notify me that she now has appt with [facility] for a bit later this month, therefore no referral order needed from me.  3) DM 2: encouraged pt to restart her DM meds and keep f/u appt already arranged with Dr. Elvera Lennox.  4) Preventative health care: Flu vaccine today.  An After Visit Summary was printed and given to the patient.  Spent 35 min with pt today, with >50% of this time spent in counseling and care coordination regarding the  above problems.  FOLLOW UP: Return in about 2 weeks (around 07/05/2019) for f/u dep/pain (in office or virtual-pt preference).  Signed:  Santiago Bumpers, MD           06/21/2019

## 2019-06-21 NOTE — Telephone Encounter (Signed)
FYI. Please see below. Pt had f/u appt today with PCP

## 2019-06-21 NOTE — Telephone Encounter (Signed)
Noted  

## 2019-06-21 NOTE — Telephone Encounter (Signed)
Patient has an appointment on 06/28/19 at 8:30 at [facility].

## 2019-06-22 ENCOUNTER — Other Ambulatory Visit: Payer: Self-pay | Admitting: Family Medicine

## 2019-06-22 DIAGNOSIS — Z1231 Encounter for screening mammogram for malignant neoplasm of breast: Secondary | ICD-10-CM

## 2019-06-23 ENCOUNTER — Other Ambulatory Visit: Payer: Self-pay | Admitting: Family Medicine

## 2019-06-23 NOTE — Telephone Encounter (Signed)
RF request for atorvastatin, metformin, and Buspar.   I see in last OV note that patient was to restart all meds but unsure exactly what is okay to send.  Please advise.

## 2019-07-04 ENCOUNTER — Encounter: Payer: Self-pay | Admitting: Family Medicine

## 2019-07-04 ENCOUNTER — Ambulatory Visit: Payer: 59 | Admitting: Family Medicine

## 2019-07-04 ENCOUNTER — Other Ambulatory Visit: Payer: Self-pay

## 2019-07-04 VITALS — BP 108/75 | HR 86 | Temp 97.9°F | Resp 16 | Ht 65.0 in | Wt 215.0 lb

## 2019-07-04 DIAGNOSIS — I10 Essential (primary) hypertension: Secondary | ICD-10-CM

## 2019-07-04 DIAGNOSIS — F419 Anxiety disorder, unspecified: Secondary | ICD-10-CM | POA: Diagnosis not present

## 2019-07-04 DIAGNOSIS — Z9114 Patient's other noncompliance with medication regimen: Secondary | ICD-10-CM

## 2019-07-04 DIAGNOSIS — E7800 Pure hypercholesterolemia, unspecified: Secondary | ICD-10-CM | POA: Diagnosis not present

## 2019-07-04 DIAGNOSIS — F3341 Major depressive disorder, recurrent, in partial remission: Secondary | ICD-10-CM

## 2019-07-04 DIAGNOSIS — Z91148 Patient's other noncompliance with medication regimen for other reason: Secondary | ICD-10-CM

## 2019-07-04 MED ORDER — BUTALBITAL-APAP-CAFFEINE 50-325-40 MG PO TABS: ORAL_TABLET | ORAL | 1 refills | Status: DC

## 2019-07-04 MED ORDER — HYDROCHLOROTHIAZIDE 25 MG PO TABS: 25.0000 mg | ORAL_TABLET | Freq: Every day | ORAL | 5 refills | Status: DC

## 2019-07-04 NOTE — Progress Notes (Signed)
OFFICE VISIT  07/04/2019   CC:  Chief Complaint  Patient presents with  . Follow-up    RCI, pt is fasting    HPI:    Patient is a 59 y.o. Caucasian female who presents for 2 wk f/u chronic medical issues. Last f/u she had been off all meds, depressed. We discussed a plan to get her restarted on all of her meds, get back into f/u with Dr. Cruzita Lederer, and get in with an orthopedist for her R knee problems. Labs showed glucose pretty elevated and cholesterol very high, otherwise fine (no A1c done).  Interim hx: She got back on her meds, feels a little better regarding mood.   She titrated her duloxetine and lamictal as instructed and is now back on her maintenance dose of these. Saw ortho last week, got steroid injection in R knee, MRI planned. Taking hydrocodone avg 1 per day. Has appt with Dr. Cruzita Lederer 07/28/19.   Past Medical History:  Diagnosis Date  . Abnormal nuclear stress test 12/31/11   Low risk lexiscan, but left heart cath was recommended and this showed normal coronary arteries and normal LV function. (01/01/12)  . Anxiety    + ?mood disorder (awaiting old psych records as of 12/19/11.  . Carpal tunnel syndrome on both sides   . Chronic pain syndrome    low back pain  . Colon cancer screening    Pt did not return cologuard 10/2016.  She has chosen not to get colonoscopy as of 2018 f/u.  Pt did not return cologuard 09/2017.  Marland Kitchen Daytime somnolence    Nuvigil rx'd by her psychiatrist.  Pt reports that no sleep study has been done.  . DDD (degenerative disc disease), lumbar    Pt says she has hx of herniated disc that has resulted in some numbness in a few toes on left foot.  . 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  I have yet to see/hear any evidence of borderline personality d/o in her since  she has been my pt.  . DM2 (diabetes mellitus, type 2) ([place])    Managed by ENDO--Dr. Cruzita Lederer.  +hx of GDM both pregnancies, dx'd with DM 2 in her 58K--DXIPJA hx of complications  with eyes, kidneys, nerves, or CV system.  . Hepatic steatosis   . History of vitamin D deficiency 2009  . Hyperlipidemia   . Hypertension   . Iron deficiency anemia 2013   Hx of: Likely occult upper GI bleeding.  Hb 8.6--came up to 12 at most recent check after getting on integra, which she now continues once daily.   iFOB was negative 01/13/12.  She says she had been taking excessive NSAIDs at the time.  She has never had a colonoscopy.  . Low back pain 12/19/2011  . Migraine syndrome   . Mixed urge and stress incontinence   . Multinodular goiter    Hx of multiple benign biopsies; repeated u/s's showed no change until the one on 04/2014.  FNA 06/2013 showed R and isthmus nodules intermediate so f/u bx 6 mo recommended.  Repeat bx 07/26/15 showed R lobe benign, isthmus atypical cells of undetermined signif,--detailed path analysis was done and it was BENIGN.  U/S 11/2017 -->NO CHANGE in nodules.  . Obesity   . Osteoarthritis 06/2019   tricompartmental, R  knee->pt to see ortho  . Plantar fasciitis    Steroid injection by podiatrist 09/2017  . Renal cyst, acquired, right 2017  . Toe fracture, left 09/2017   Distal phalanx L third toe (saw podiatrist)    Past Surgical History:  Procedure Laterality Date  . CARDIAC CATHETERIZATION  01/01/12   Normal coronaries and normal LV function.  Marland Kitchen CESAREAN SECTION     X 2  . FNA bx Thyroid nodules  06/29/14   x 4: some findings to support benign cyst and some "cellular atypica of indeterminate significance".--followed by Dr. Elvera Lennox.  Repeat bx 05/2015 BENIGN.    Outpatient Medications Prior to Visit  Medication Sig Dispense Refill  . aspirin 81 MG tablet Take 81 mg by mouth daily.    Marland Kitchen atorvastatin (LIPITOR) 80 MG tablet TAKE 1 TABLET BY MOUTH EVERY DAY 90 tablet 3  .  busPIRone (BUSPAR) 5 MG tablet TAKE 1/2 TABLET BY MOUTH THREE TIMES A DAY 45 tablet 3  . DULoxetine (CYMBALTA) 30 MG capsule Take 1 capsule (30 mg total) by mouth daily. 7 capsule 0  . glipiZIDE (GLUCOTROL XL) 5 MG 24 hr tablet TAKE 2 TABLETS BY MOUTH ONCE DAILY WITH BREAKFAST 60 tablet 6  . HYDROcodone-acetaminophen (NORCO/VICODIN) 5-325 MG tablet TAKE 1 TO 2 TABLETS EVERY 6 HOURS AS NEEDED FOR PAIN 120 tablet 0  . lamoTRIgine (LAMICTAL) 200 MG tablet TAKE 1 TABLET BY MOUTH EVERY DAY 30 tablet 6  . lisinopril (ZESTRIL) 40 MG tablet Take 1 tablet (40 mg total) by mouth daily. 90 tablet 3  . LORazepam (ATIVAN) 0.5 MG tablet TAKE 2 TABLETS BY MOUTH AT BEDTIME 60 tablet 5  . meloxicam (MOBIC) 7.5 MG tablet Take 1 tablet (7.5 mg total) by mouth daily. 30 tablet 3  . metFORMIN (GLUCOPHAGE) 1000 MG tablet TAKE 1 TABLET BY MOUTH TWICE A DAY FOR DIABETES 180 tablet 3  . metoprolol succinate (TOPROL-XL) 25 MG 24 hr tablet Take 1 tablet (25 mg total) by mouth daily. 30 tablet 3  . Multiple Vitamin (MULTIVITAMIN) tablet Take 1 tablet by mouth daily.    . Multiple Vitamins-Minerals (PRESERVISION AREDS) CAPS Take 1 capsule by mouth daily.    . Omega-3 Fatty Acids (FISH OIL) 1000 MG CAPS Take 1 capsule by mouth at bedtime.    Marland Kitchen omeprazole (PRILOSEC) 40 MG capsule TAKE 1 CAPSULE BY MOUTH EVERY DAY 30 capsule 5  . oxybutynin (DITROPAN-XL) 10 MG 24 hr tablet TAKE 1 TABLET BY MOUTH EVERY DAY 30 tablet 5  . promethazine (PHENERGAN) 12.5 MG tablet 1-2 tabs po q6h prn nausea/vomiting 20 tablet 3  . Semaglutide,0.25 or 0.5MG /DOS, (OZEMPIC, 0.25 OR 0.5 MG/DOSE,) 2 MG/1.5ML SOPN Inject 0.5 mg into the skin once a week. 3 pen 4  . hydrochlorothiazide (HYDRODIURIL) 25 MG tablet TAKE 1 TABLET BY MOUTH EVERY DAY 30 tablet 5  . canagliflozin (INVOKANA) 100 MG TABS tablet TAKE 1 TABLET BY MOUTH DAILY BEFORE THE FIRST MEAL OF THE DAY (Patient not taking: Reported on 03/17/2019) 30 tablet 11  . dicyclomine (BENTYL) 10 MG capsule  1 tab po tid prn abd bloating/cramping (Patient not taking: Reported on 03/17/2019) 30 capsule 0  . DULoxetine (CYMBALTA) 60 MG capsule TAKE 1 CAPSULE BY MOUTH EVERY DAY (Patient not taking: Reported on 03/17/2019) 30 capsule 5  . glucose blood (ONETOUCH VERIO) test strip Use as instructed to check sugar 2 times daily (Patient not taking: Reported on 03/17/2019) 200 each 5  . hydrocortisone (PROCTOSOL HC) 2.5 % rectal cream  apply rectally twice a day (Patient not taking: Reported on 03/17/2019) 30 g 6  . Insulin Degludec (TRESIBA FLEXTOUCH) 200 UNIT/ML SOPN Inject 46 Units into the skin daily. (Patient not taking: Reported on 03/17/2019) 3 pen 5  . Insulin Pen Needle 31G X 4 MM MISC 1 each by Does not apply route at bedtime. (Patient not taking: Reported on 03/17/2019) 100 each 5  . Lancet Devices (ONE TOUCH DELICA LANCING DEV) MISC Use to check sugar 2 times daily (Patient not taking: Reported on 03/17/2019) 200 each 5  . butalbital-acetaminophen-caffeine (FIORICET, ESGIC) 50-325-40 MG tablet take 1 to 2 tablets by mouth twice a day if needed for headache (Patient not taking: Reported on 03/17/2019) 60 tablet 1   No facility-administered medications prior to visit.    Allergies  Allergen Reactions  . Levofloxacin In D5w Hives  . Penicillins     Blistery rash  . Victoza [Liraglutide] Nausea Only    ROS As per HPI  PE: Blood pressure 108/75, pulse 86, temperature 97.9 F (36.6 C), temperature source Temporal, resp. rate 16, height 5\' 5"  (1.651 m), weight 215 lb (97.5 kg), last menstrual period 06/04/2013, SpO2 97 %. Gen: Alert, well appearing.  Patient is oriented to person, place, time, and situation. AFFECT: pleasant, lucid thought and speech. No exam today.  LABS:  Lab Results  Component Value Date   TSH 1.00 06/21/2019   Lab Results  Component Value Date   WBC 5.9 06/21/2019   HGB 15.5 (H) 06/21/2019   HCT 45.4 06/21/2019   MCV 91.0 06/21/2019   PLT 218.0 06/21/2019   No results  found for: IRON, TIBC, FERRITIN  No results found for: VITAMINB12  Lab Results  Component Value Date   CREATININE 0.62 06/21/2019   BUN 11 06/21/2019   NA 135 06/21/2019   K 3.9 06/21/2019   CL 96 06/21/2019   CO2 28 06/21/2019   Lab Results  Component Value Date   ALT 10 06/21/2019   AST 10 06/21/2019   ALKPHOS 123 (H) 06/21/2019   BILITOT 0.6 06/21/2019   Lab Results  Component Value Date   CHOL 418 (H) 06/21/2019   Lab Results  Component Value Date   HDL 40.70 06/21/2019   Lab Results  Component Value Date   LDLCALC 88 01/18/2018   Lab Results  Component Value Date   TRIG 331.0 (H) 06/21/2019   Lab Results  Component Value Date   CHOLHDL 10 06/21/2019   Lab Results  Component Value Date   HGBA1C 11.6 (A) 08/26/2018    IMPRESSION AND PLAN:  1) Medical noncompliance: primarily due to a flare of her chronic anxiety and depression. She is improved since getting back on all meds (psych meds, DM, HTN, HLD, GERD, chronic pain, and OAB). Once again, encouraged her to focus more on work life/home life balance->she tends to overwhelm herself with work to try to cope with chronic anx/dep.  2) Right knee pain: acute on chronic->recent steroid injection by ortho, plan for MRI knee.  An After Visit Summary was printed and given to the patient.  FOLLOW UP: Return in about 3 months (around 10/02/2019) for routine chronic illness f/u.  Signed:  10/04/2019, MD           07/04/2019

## 2019-07-16 ENCOUNTER — Other Ambulatory Visit: Payer: Self-pay | Admitting: Family Medicine

## 2019-07-20 ENCOUNTER — Ambulatory Visit (INDEPENDENT_AMBULATORY_CARE_PROVIDER_SITE_OTHER): Payer: 59 | Admitting: Family Medicine

## 2019-07-20 ENCOUNTER — Other Ambulatory Visit: Payer: Self-pay | Admitting: Family Medicine

## 2019-07-20 ENCOUNTER — Encounter: Payer: Self-pay | Admitting: Family Medicine

## 2019-07-20 ENCOUNTER — Other Ambulatory Visit: Payer: Self-pay

## 2019-07-20 ENCOUNTER — Ambulatory Visit (HOSPITAL_BASED_OUTPATIENT_CLINIC_OR_DEPARTMENT_OTHER)
Admission: RE | Admit: 2019-07-20 | Discharge: 2019-07-20 | Disposition: A | Discharge status: Home / Self Care | Payer: 59 | Source: Ambulatory Visit | Attending: Family Medicine | Admitting: Family Medicine

## 2019-07-20 VITALS — BP 152/76 | HR 72

## 2019-07-20 DIAGNOSIS — S20212A Contusion of left front wall of thorax, initial encounter: Secondary | ICD-10-CM

## 2019-07-20 MED ORDER — FLUCONAZOLE 150 MG PO TABS: 150.0000 mg | ORAL_TABLET | Freq: Once | ORAL | 3 refills | Status: AC

## 2019-07-20 NOTE — Progress Notes (Signed)
Virtual Visit via Video Note  I connected with Kristi Guerrero on 07/20/19 at 11:30 AM EST by telephone (failed video enabled telemedicine application) and verified that I am speaking with the correct person using two identifiers.  Location patient: home Location provider:work or home office Persons participating in the virtual visit: patient, provider  I discussed the limitations of evaluation and management by telemedicine and the availability of in person appointments. The patient expressed understanding and agreed to proceed.  Telemedicine visit is a necessity given the COVID-19 restrictions in place at the current time.  HPI: 59 y/o WF with whom I am doing a telephone visit today (due to COVID-19 pandemic restrictions) for left sided rib pain s/p fall 07/17/19. Bent over to get something off ground, lost balance and fell onto concrete and hit L chest wall. Knocked the breath out of her. No head injury, no LOC.  Hurting in L chest into L axillary area since then.  Hurts to take a deep breath and bad pain with sneezing.  Movements of torso increase the pain. No SOB/hemoptysis/covid.  She had first covid injection 07/18/19.    ROS: See pertinent positives and negatives per HPI.  Past Medical History:  Diagnosis Date  . Abnormal nuclear stress test 12/31/11   Low risk lexiscan, but left heart cath was recommended and this showed normal coronary arteries and normal LV function. (01/01/12)  . Anxiety    + ?mood disorder (awaiting old psych records as of 12/19/11.  . Carpal tunnel syndrome on both sides   . Chronic pain syndrome    low back pain  . Colon cancer screening    Kristi Guerrero did not return cologuard 10/2016.  She has chosen not to get colonoscopy as of 2018 f/u.  Kristi Guerrero did not return cologuard 09/2017.  Marland Kitchen Daytime somnolence    Nuvigil rx'd by her psychiatrist.  Kristi Guerrero reports that no sleep study has been done.  . DDD (degenerative disc disease), lumbar    Kristi Guerrero says she has hx of herniated disc that has  resulted in some numbness in a few toes on left foot.  . 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  I have yet to see/hear any evidence of borderline personality d/o in her since she has been my Kristi Guerrero.  . DM2 (diabetes mellitus, type 2) ([place])    Managed by ENDO--Dr. Cruzita Lederer.  +hx of GDM both pregnancies, dx'd with DM 2 in her 73A--LPFXTK hx of complications  with eyes, kidneys, nerves, or CV system.  . Hepatic steatosis   . History of vitamin D deficiency 2009  . Hyperlipidemia   . Hypertension   . Iron deficiency anemia 2013   Hx of: Likely occult upper GI bleeding.  Hb 8.6--came up to 12 at most recent check after getting on integra, which she now continues once daily.   iFOB was negative 01/13/12.  She says she had been taking excessive NSAIDs at the time.  She has never had a colonoscopy.  . Low back pain 12/19/2011  . Migraine syndrome   . Mixed urge and stress incontinence   . Multinodular goiter    Hx of multiple benign biopsies; repeated u/s's showed no change until the one on 04/2014.  FNA 06/2013 showed R and isthmus nodules intermediate so f/u bx 6 mo recommended.  Repeat bx 07/26/15 showed R lobe benign, isthmus atypical cells of undetermined signif,--detailed path analysis was done and it was BENIGN.  U/S 11/2017 -->NO CHANGE in nodules.  . Obesity   . Osteoarthritis 06/2019   tricompartmental, R knee->Kristi Guerrero to see ortho  . Plantar fasciitis    Steroid injection by podiatrist 09/2017  . Renal cyst, acquired, right 2017  . Toe fracture, left 09/2017   Distal phalanx L third toe (saw podiatrist)    Past Surgical History:  Procedure Laterality Date  . CARDIAC CATHETERIZATION  01/01/12   Normal coronaries and normal LV function.  Marland Kitchen CESAREAN SECTION     X 2  . FNA bx Thyroid nodules  06/29/14   x 4: some  findings to support benign cyst and some "cellular atypica of indeterminate significance".--followed by Dr. Elvera Lennox.  Repeat bx 05/2015 BENIGN.    Family History  Problem Relation Age of Onset  . Cancer Mother        breast and follicular thyroid cancer  . Depression Mother   . Breast cancer Mother   . Diabetes Father   . Alcohol abuse Sister   . Hypertension Maternal Grandmother   . Hyperlipidemia Maternal Grandmother   . Heart disease Maternal Grandmother   . Stroke Maternal Grandmother   . Cancer Maternal Grandfather        colon cancer  . Stroke Maternal Grandfather   . Hypertension Maternal Grandfather   . Hyperlipidemia Maternal Grandfather   . Heart disease Maternal Grandfather       Current Outpatient Medications:  .  aspirin 81 MG tablet, Take 81 mg by mouth daily., Disp: , Rfl:  .  atorvastatin (LIPITOR) 80 MG tablet, TAKE 1 TABLET BY MOUTH EVERY DAY, Disp: 90 tablet, Rfl: 3 .  busPIRone (BUSPAR) 5 MG tablet, TAKE 1/2 TABLET BY MOUTH 3 TIMES A DAY, Disp: 135 tablet, Rfl: 0 .  DULoxetine (CYMBALTA) 60 MG capsule, TAKE 1 CAPSULE BY MOUTH EVERY DAY, Disp: 30 capsule, Rfl: 5 .  glipiZIDE (GLUCOTROL XL) 5 MG 24 hr tablet, TAKE 2 TABLETS BY MOUTH ONCE DAILY WITH BREAKFAST, Disp: 60 tablet, Rfl: 6 .  hydrochlorothiazide (HYDRODIURIL) 25 MG tablet, Take 1 tablet (25 mg total) by mouth daily., Disp: 30 tablet, Rfl: 5 .  HYDROcodone-acetaminophen (NORCO/VICODIN) 5-325 MG tablet, TAKE 1 TO 2 TABLETS EVERY 6 HOURS AS NEEDED FOR PAIN, Disp: 120 tablet, Rfl: 0 .  lamoTRIgine (LAMICTAL) 200 MG tablet, TAKE 1 TABLET BY MOUTH EVERY DAY, Disp: 30 tablet, Rfl: 6 .  lisinopril (ZESTRIL) 40 MG tablet, Take 1 tablet (40 mg total) by mouth daily., Disp: 90 tablet, Rfl: 3 .  LORazepam (ATIVAN) 0.5 MG tablet, TAKE 2 TABLETS BY MOUTH AT BEDTIME, Disp: 60 tablet, Rfl: 5 .  meloxicam (MOBIC) 7.5 MG tablet, Take 1 tablet (7.5 mg total) by mouth daily., Disp: 30 tablet, Rfl: 3 .  metFORMIN  (GLUCOPHAGE) 1000 MG tablet, TAKE 1 TABLET BY MOUTH TWICE A DAY FOR DIABETES, Disp: 180 tablet, Rfl: 3 .  metoprolol succinate (TOPROL-XL) 25 MG 24 hr tablet, Take 1 tablet (25 mg total) by mouth daily., Disp: 30 tablet, Rfl: 3 .  Multiple Vitamin (MULTIVITAMIN) tablet, Take 1 tablet by mouth daily., Disp: , Rfl:  .  Multiple Vitamins-Minerals (PRESERVISION AREDS) CAPS, Take 1 capsule by mouth daily., Disp: , Rfl:  .  Omega-3 Fatty Acids (FISH OIL) 1000 MG CAPS, Take 1 capsule by mouth at bedtime., Disp: , Rfl:  .  omeprazole (PRILOSEC) 40 MG capsule,  TAKE 1 CAPSULE BY MOUTH EVERY DAY, Disp: 30 capsule, Rfl: 5 .  oxybutynin (DITROPAN-XL) 10 MG 24 hr tablet, TAKE 1 TABLET BY MOUTH EVERY DAY, Disp: 30 tablet, Rfl: 5 .  butalbital-acetaminophen-caffeine (FIORICET) 50-325-40 MG tablet, take 1 to 2 tablets by mouth twice a day if needed for headache (Patient not taking: Reported on 07/20/2019), Disp: 60 tablet, Rfl: 1 .  canagliflozin (INVOKANA) 100 MG TABS tablet, TAKE 1 TABLET BY MOUTH DAILY BEFORE THE FIRST MEAL OF THE DAY (Patient not taking: Reported on 03/17/2019), Disp: 30 tablet, Rfl: 11 .  dicyclomine (BENTYL) 10 MG capsule, 1 tab po tid prn abd bloating/cramping (Patient not taking: Reported on 03/17/2019), Disp: 30 capsule, Rfl: 0 .  glucose blood (ONETOUCH VERIO) test strip, Use as instructed to check sugar 2 times daily (Patient not taking: Reported on 07/20/2019), Disp: 200 each, Rfl: 5 .  hydrocortisone (PROCTOSOL HC) 2.5 % rectal cream, apply rectally twice a day (Patient not taking: Reported on 03/17/2019), Disp: 30 g, Rfl: 6 .  Insulin Degludec (TRESIBA FLEXTOUCH) 200 UNIT/ML SOPN, Inject 46 Units into the skin daily. (Patient not taking: Reported on 03/17/2019), Disp: 3 pen, Rfl: 5 .  Insulin Pen Needle 31G X 4 MM MISC, 1 each by Does not apply route at bedtime. (Patient not taking: Reported on 03/17/2019), Disp: 100 each, Rfl: 5 .  Lancet Devices (ONE TOUCH DELICA LANCING DEV) MISC, Use to check  sugar 2 times daily (Patient not taking: Reported on 03/17/2019), Disp: 200 each, Rfl: 5 .  promethazine (PHENERGAN) 12.5 MG tablet, 1-2 tabs po q6h prn nausea/vomiting (Patient not taking: Reported on 07/20/2019), Disp: 20 tablet, Rfl: 3 .  Semaglutide,0.25 or 0.5MG /DOS, (OZEMPIC, 0.25 OR 0.5 MG/DOSE,) 2 MG/1.5ML SOPN, Inject 0.5 mg into the skin once a week. (Patient not taking: Reported on 07/20/2019), Disp: 3 pen, Rfl: 4  EXAM:  VITALS per patient if applicable: BP (!) 152/76 (BP Location: Left Arm, Patient Position: Sitting, Cuff Size: Large)   Pulse 72   LMP 06/04/2013    GENERAL: alert, oriented, sounds well and in no acute distress  No further exam b/c audio visit only.  LABS: none today   Chemistry      Component Value Date/Time   NA 135 06/21/2019 0908   K 3.9 06/21/2019 0908   CL 96 06/21/2019 0908   CO2 28 06/21/2019 0908   BUN 11 06/21/2019 0908   CREATININE 0.62 06/21/2019 0908   CREATININE 0.67 07/31/2017 1621      Component Value Date/Time   CALCIUM 9.4 06/21/2019 0908   ALKPHOS 123 (H) 06/21/2019 0908   AST 10 06/21/2019 0908   ALT 10 06/21/2019 0908   BILITOT 0.6 06/21/2019 0908     Lab Results  Component Value Date   HGBA1C 11.6 (A) 08/26/2018   ASSESSMENT AND PLAN:  Discussed the following assessment and plan:  Chest wall contusion, left side. Obtain rib films to eval for fracture. She may start the vicodin 5/325 she already has: 1-2 q6h prn. Expectant mgmt discussed.  She has chronic R knee pain with meniscal tears.  Orthopedist recommends arthroscopic surgery but wants to wait until her A1c is improved.     Spent 12 min with Kristi Guerrero today, with >50% of this time spent in counseling and care coordination regarding the above problems.  I discussed the assessment and treatment plan with the patient. The patient was provided an opportunity to ask questions and all were answered. The patient agreed with the plan and  demonstrated an understanding of the  instructions.   The patient was advised to call back or seek an in-person evaluation if the symptoms worsen or if the condition fails to improve as anticipated.  F/u: prn  Signed:  Santiago Bumpers, MD           07/20/2019

## 2019-07-27 ENCOUNTER — Other Ambulatory Visit: Payer: Self-pay

## 2019-07-28 ENCOUNTER — Encounter: Payer: Self-pay | Admitting: Internal Medicine

## 2019-07-28 ENCOUNTER — Ambulatory Visit: Payer: 59 | Admitting: Internal Medicine

## 2019-07-28 VITALS — BP 110/70 | HR 77 | Ht 65.0 in | Wt 217.0 lb

## 2019-07-28 DIAGNOSIS — E785 Hyperlipidemia, unspecified: Secondary | ICD-10-CM | POA: Diagnosis not present

## 2019-07-28 DIAGNOSIS — E042 Nontoxic multinodular goiter: Secondary | ICD-10-CM

## 2019-07-28 DIAGNOSIS — Z794 Long term (current) use of insulin: Secondary | ICD-10-CM

## 2019-07-28 DIAGNOSIS — E1165 Type 2 diabetes mellitus with hyperglycemia: Secondary | ICD-10-CM | POA: Diagnosis not present

## 2019-07-28 LAB — POCT GLYCOSYLATED HEMOGLOBIN (HGB A1C): Hemoglobin A1C: 13.3 % — AB (ref 4.0–5.6)

## 2019-07-28 MED ORDER — INSULIN PEN NEEDLE 31G X 4 MM MISC: 1.0000 | Freq: Four times a day (QID) | 3 refills | Status: AC

## 2019-07-28 MED ORDER — HUMALOG KWIKPEN 200 UNIT/ML ~~LOC~~ SOPN: 8.0000 [IU] | PEN_INJECTOR | Freq: Three times a day (TID) | SUBCUTANEOUS | 3 refills | Status: DC

## 2019-07-28 NOTE — Progress Notes (Signed)
Patient ID: Kristi Guerrero, female   DOB: 1961-01-31, 59 y.o.   MRN: [id number]   This visit occurred during the SARS-CoV-2 public health emergency.  Safety protocols were in place, including screening questions prior to the visit, additional usage of staff PPE, and extensive cleaning of exam room while observing appropriate contact time as indicated for disinfecting solutions.   HPI  Kristi Guerrero is a 59 y.o.-year-old female, returning for f/u for MNG and insulin-dependent DM2, dx'ed in the 1990s- had GDM x2, uncontrolled, insulin-dependent. Last visit 11 months ago.  She is not usually compliant with visits, sugar checks, and medication doses.  Before last visit, I advised her to decrease the dose of Ozempic due to GI intolerance.  However, at last visit, she was off these completely.  She also missed many insulin doses.  At this visit, she again tells me that for the most part of last year she was off all of her medicines as she was busy at work and depressed during the coronavirus pandemic.  DM2:  Last HbA1c:  Lab Results  Component Value Date   HGBA1C 11.6 (A) 08/26/2018   HGBA1C 8.3 (A) 02/08/2018   HGBA1C 10.3 (A) 11/06/2017   HGBA1C 9.6 08/06/2017   HGBA1C 11.1 03/13/2017   HGBA1C 10.2 07/22/2016   HGBA1C 12.1 04/08/2016   HGBA1C 6.7 06/13/2015   HGBA1C 8.2 (H) 11/17/2014   HGBA1C 8.4 (H) 06/19/2014   HGBA1C 8.0 (H) 03/06/2014   HGBA1C 8.5 (H) 10/21/2013   HGBA1C 7.5 (H) 03/31/2013   HGBA1C 6.3 (H) 09/29/2012   HGBA1C 6.5 05/31/2012   HGBA1C 7.4 (H) 12/19/2011   At last visit she was on: -  >> stopped 2 mo ago 2/2 pharmacy issues - Metformin 1000 mg 2x a day with meals. - Glipizide XL 10 mg in am.   -  >> we increased the dose but she had diarrhea and nausea >> stopped - Lantus 45-48 units at bedtime >> missed doses - off and on Tried Januvia >> expensive. Tried Byetta >> did not work well.  Tried Victoza >> nausea. She was on Actos in the past.  At last visit I  suggested: - Metformin 1000 mg 2x a day with meals - just restarted 3 weeks ago - Glipizide XL 10 mg in am   - just restarted 3 weeks ago - Invokana 100 mg in am >> did not get - Ozempic 0.5 mg weekly >> did not get - Tresiba 46 units daily >> stopped after last OV  She is checking her sugars seldom: - am: 180-240 >> mid 200s >> 150-200 >> upper 200s >> 200s - 2h after b'fast: n/c - lunch: 120s >> n/c - 2h after lunch: n/c - dinner: 112-130s >> mid200s >> n/c - 2h after dinner: 200s >> 300s >> 200 >>200-300s >> 300-400s - bedtime: 180s >> n/c  No CKD. Last BUN/Cr: Lab Results  Component Value Date   BUN 11 06/21/2019   Lab Results  Component Value Date   CREATININE 0.62 06/21/2019  On lisinopril.  + HL. Last Lipid panel: Lab Results  Component Value Date   CHOL 418 (H) 06/21/2019   HDL 40.70 06/21/2019   LDLCALC 88 01/18/2018   LDLDIRECT 317.0 06/21/2019   TRIG 331.0 (H) 06/21/2019   CHOLHDL 10 06/21/2019  On Lipitor.  Last eye exam: 2015-2016: No DR.  She denies numbness and tingling in her feet. Has a herniated disk >> numbness in toes since ~2009.    MNG: Pt was  dx with thyroid nodules in 2006.  She previously saw endocrinology in [place].  Reviewed history: Thyroid U/S (04/20/2014) - 4 nodules, 2 enlarged from previous U/S in 2006:  Right thyroid lobe: 5.5 x 2.6 x 3.3 cm. Complex interpolar region nodule measures 3.0 x 1.9 x 2.8 cm. It is predominately solid with asmall cystic area. On the previous report, this was described to be 1.5 x 1.1 x 1.7 cm.   Left thyroid lobe: 5.4 x 2.8 x 2.5 cm. 2.8 x 2.2 x 2.3 cm interpolar region solid heterogeneous nodule. This was previously described to measure 1.7 x 0.9 x 1.8 cm. Smaller lower pole nodule measures 1.5 x 1.4 x 1.3 cm there are some peripheral calcifications.   Isthmus Thickness: 4 mm. Solid 2.7 x 1.6 x 2.3 cm heterogeneous nodule. On the previous report, measurements of this nodule were 2.7 x 2.4 x 1.7  cm.   Lymphadenopathy: None visualized.  We Bx'ed the 4 nodules on 07/01/2014: - 2 x FLUS - 2 x benign  Repeated Bx's of the FLUS nodules on 07/26/2015: - R nodule: benign - isthmic nodule: FLUS/AUS  Afirma test for isthmic nodule on 07/26/2015: Benign  Thyroid U/S (11/26/2017): Her thyroid nodules were stable or smaller  Latest TSH was normal: Lab Results  Component Value Date   TSH 1.00 06/21/2019    Pt denies: - feeling nodules in neck - hoarseness - dysphagia - choking - SOB with lying down  Pt does have a + FH of follicular thyroid cancer in mother.   Works in [employer].  She works third shift.  ROS: Constitutional: no weight gain/+ weight loss, + fatigue, + subjective hyperthermia, no subjective hypothermia Eyes: no blurry vision, no xerophthalmia ENT: no sore throat, + see HPI Cardiovascular: no CP/no SOB/no palpitations/no leg swelling Respiratory: no cough/no SOB/no wheezing Gastrointestinal: no N/no V/no D/no C/no acid reflux Musculoskeletal: no muscle aches/no joint aches Skin: no rashes, no hair loss Neurological: no tremors/no numbness/no tingling/no dizziness  I reviewed pt's medications, allergies, PMH, social hx, family hx, and changes were documented in the history of present illness. Otherwise, unchanged from my initial visit note.  Past Medical History:  Diagnosis Date  . Abnormal nuclear stress test 12/31/11   Low risk lexiscan, but left heart cath was recommended and this showed normal coronary arteries and normal LV function. (01/01/12)  . Anxiety    + ?mood disorder (awaiting old psych records as of 12/19/11.  . Carpal tunnel syndrome on both sides   . Chronic pain syndrome    low back pain  . Colon cancer screening    Pt did not return cologuard 10/2016.  She has chosen not to get colonoscopy as of 2018 f/u.  Pt did not return cologuard 09/2017.  Marland Kitchen Daytime somnolence    Nuvigil rx'd by her psychiatrist.  Pt reports that no sleep study  has been done.  . DDD (degenerative disc disease), lumbar    Pt says she has hx of herniated disc that has resulted in some numbness in a few toes on left foot.  . 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  I have yet to see/hear any evidence of borderline personality d/o in her since she has been my pt.  . DM2 (diabetes mellitus, type 2) (HCC)  Managed by ENDO--Dr. Elvera Lennox.  +hx of GDM both pregnancies, dx'd with DM 2 in her 30s--denies hx of complications  with eyes, kidneys, nerves, or CV system.  . Hepatic steatosis   . History of vitamin D deficiency 2009  . Hyperlipidemia   . Hypertension   . Iron deficiency anemia 2013   Hx of: Likely occult upper GI bleeding.  Hb 8.6--came up to 12 at most recent check after getting on integra, which she now continues once daily.   iFOB was negative 01/13/12.  She says she had been taking excessive NSAIDs at the time.  She has never had a colonoscopy.  . Low back pain 12/19/2011  . Migraine syndrome   . Mixed urge and stress incontinence   . Multinodular goiter    Hx of multiple benign biopsies; repeated u/s's showed no change until the one on 04/2014.  FNA 06/2013 showed R and isthmus nodules intermediate so f/u bx 6 mo recommended.  Repeat bx 07/26/15 showed R lobe benign, isthmus atypical cells of undetermined signif,--detailed path analysis was done and it was BENIGN.  U/S 11/2017 -->NO CHANGE in nodules.  . Obesity   . Osteoarthritis 06/2019   tricompartmental, R knee->pt to see ortho  . Plantar fasciitis    Steroid injection by podiatrist 09/2017  . Renal cyst, acquired, right 2017  . Toe fracture, left 09/2017   Distal phalanx L third toe (saw podiatrist)   Past Surgical History:  Procedure Laterality Date  . CARDIAC CATHETERIZATION  01/01/12   Normal coronaries  and normal LV function.  Marland Kitchen CESAREAN SECTION     X 2  . FNA bx Thyroid nodules  06/29/14   x 4: some findings to support benign cyst and some "cellular atypica of indeterminate significance".--followed by Dr. Elvera Lennox.  Repeat bx 05/2015 BENIGN.   History   Social History Main Topics  . Smoking status: Never Smoker   . Smokeless tobacco: Never Used  . Alcohol Use: No  . Drug Use: No   Social History Narrative   Divorced, 2 grown children (one in [place] and one in [place]).  One grandchild.   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   No T/A/Ds.   Exercise: occ goes to the [facility].   Currently living in [place], [place].   Current Outpatient Medications on File Prior to Visit  Medication Sig Dispense Refill  . aspirin 81 MG tablet Take 81 mg by mouth daily.    Marland Kitchen atorvastatin (LIPITOR) 80 MG tablet TAKE 1 TABLET BY MOUTH EVERY DAY 90 tablet 3  . busPIRone (BUSPAR) 5 MG tablet TAKE 1/2 TABLET BY MOUTH 3 TIMES A DAY 135 tablet 0  . butalbital-acetaminophen-caffeine (FIORICET) 50-325-40 MG tablet take 1 to 2 tablets by mouth twice a day if needed for headache (Patient not taking: Reported on 07/20/2019) 60 tablet 1  . canagliflozin (INVOKANA) 100 MG TABS tablet TAKE 1 TABLET BY MOUTH DAILY BEFORE THE FIRST MEAL OF THE DAY (Patient not taking: Reported on 03/17/2019) 30 tablet 11  . dicyclomine (BENTYL) 10 MG capsule 1 tab po tid prn abd bloating/cramping (Patient not taking: Reported on 03/17/2019) 30 capsule 0  . DULoxetine (CYMBALTA) 60 MG capsule TAKE 1 CAPSULE BY MOUTH EVERY DAY 30 capsule 5  . glipiZIDE (GLUCOTROL XL) 5 MG 24 hr tablet TAKE 2 TABLETS BY MOUTH ONCE DAILY WITH BREAKFAST 60 tablet 6  . glucose blood (ONETOUCH VERIO) test strip Use as instructed to check sugar 2 times daily (  Patient not taking: Reported on 07/20/2019) 200 each 5  . hydrochlorothiazide (HYDRODIURIL) 25 MG tablet Take 1 tablet (25 mg total) by mouth daily. 30 tablet 5  . HYDROcodone-acetaminophen  (NORCO/VICODIN) 5-325 MG tablet TAKE 1 TO 2 TABLETS EVERY 6 HOURS AS NEEDED FOR PAIN 120 tablet 0  . hydrocortisone (PROCTOSOL HC) 2.5 % rectal cream apply rectally twice a day (Patient not taking: Reported on 03/17/2019) 30 g 6  . Insulin Degludec (TRESIBA FLEXTOUCH) 200 UNIT/ML SOPN Inject 46 Units into the skin daily. (Patient not taking: Reported on 03/17/2019) 3 pen 5  . Insulin Pen Needle 31G X 4 MM MISC 1 each by Does not apply route at bedtime. (Patient not taking: Reported on 03/17/2019) 100 each 5  . lamoTRIgine (LAMICTAL) 200 MG tablet TAKE 1 TABLET BY MOUTH EVERY DAY 30 tablet 6  . Lancet Devices (ONE TOUCH DELICA LANCING DEV) MISC Use to check sugar 2 times daily (Patient not taking: Reported on 03/17/2019) 200 each 5  . lisinopril (ZESTRIL) 40 MG tablet Take 1 tablet (40 mg total) by mouth daily. 90 tablet 3  . LORazepam (ATIVAN) 0.5 MG tablet TAKE 2 TABLETS BY MOUTH AT BEDTIME 60 tablet 5  . meloxicam (MOBIC) 7.5 MG tablet Take 1 tablet (7.5 mg total) by mouth daily. 30 tablet 3  . metFORMIN (GLUCOPHAGE) 1000 MG tablet TAKE 1 TABLET BY MOUTH TWICE A DAY FOR DIABETES 180 tablet 3  . metoprolol succinate (TOPROL-XL) 25 MG 24 hr tablet Take 1 tablet (25 mg total) by mouth daily. 30 tablet 3  . Multiple Vitamin (MULTIVITAMIN) tablet Take 1 tablet by mouth daily.    . Multiple Vitamins-Minerals (PRESERVISION AREDS) CAPS Take 1 capsule by mouth daily.    . Omega-3 Fatty Acids (FISH OIL) 1000 MG CAPS Take 1 capsule by mouth at bedtime.    Marland Kitchen. omeprazole (PRILOSEC) 40 MG capsule TAKE 1 CAPSULE BY MOUTH EVERY DAY 30 capsule 5  . oxybutynin (DITROPAN-XL) 10 MG 24 hr tablet TAKE 1 TABLET BY MOUTH EVERY DAY 30 tablet 5  . promethazine (PHENERGAN) 12.5 MG tablet 1-2 tabs po q6h prn nausea/vomiting (Patient not taking: Reported on 07/20/2019) 20 tablet 3  . Semaglutide,0.25 or 0.5MG /DOS, (OZEMPIC, 0.25 OR 0.5 MG/DOSE,) 2 MG/1.5ML SOPN Inject 0.5 mg into the skin once a week. (Patient not taking: Reported  on 07/20/2019) 3 pen 4   No current facility-administered medications on file prior to visit.   Allergies  Allergen Reactions  . Levofloxacin In D5w Hives  . Penicillins     Blistery rash  . Victoza [Liraglutide] Nausea Only   Family History  Problem Relation Age of Onset  . Cancer Mother        breast and follicular thyroid cancer  . Depression Mother   . Breast cancer Mother   . Diabetes Father   . Alcohol abuse Sister   . Hypertension Maternal Grandmother   . Hyperlipidemia Maternal Grandmother   . Heart disease Maternal Grandmother   . Stroke Maternal Grandmother   . Cancer Maternal Grandfather        colon cancer  . Stroke Maternal Grandfather   . Hypertension Maternal Grandfather   . Hyperlipidemia Maternal Grandfather   . Heart disease Maternal Grandfather    PE: BP 110/70   Pulse 77   Ht 5\' 5"  (1.651 m)   Wt 217 lb (98.4 kg)   LMP 06/04/2013   SpO2 98%   BMI 36.11 kg/m  Body mass index is 36.11 kg/m. Wt Readings  from Last 3 Encounters:  07/28/19 217 lb (98.4 kg)  07/04/19 215 lb (97.5 kg)  06/21/19 216 lb 3.2 oz (98.1 kg)   Constitutional: overweight, in NAD Eyes: PERRLA, EOMI, no exophthalmos ENT: moist mucous membranes, + right thyroid fullness and isthmic nodule palpable, no cervical lymphadenopathy Cardiovascular: RRR, No MRG Respiratory: CTA B Gastrointestinal: abdomen soft, NT, ND, BS+ Musculoskeletal: no deformities, strength intact in all 4 Skin: moist, warm, no rashes Neurological: no tremor with outstretched hands, DTR normal in all 4  Assessment: 1. DM2, insulin-dep., uncontrolled  2. MNG - thyroid U/S (04/20/2014):   Adequacy Reason Satisfactory For Evaluation. Diagnosis THYROID, FINE NEEDLE ASPIRATION ISTHMUS, (SPECIMEN 1 OF 4, COLLECTED ON 06/29/2014) ATYPIA OF UNDETERMINED SIGNIFICANCE OR FOLLICULAR LESION OF UNDETERMINED SIGNIFICANCE (BETHESDA CATEGORY III). SEE COMMENT. COMMENT: THE SPECIMEN CONSISTS OF SMALL AND MEDIUM SIZED  GROUPS OF FOLLICULAR EPITHELIAL CELLS AND SOME BACKGROUND COLLOID. SOME OF THE GROUPS OF CELLS ARE ARRANGED AS MICROFOLLICLES. THERE ARE SCATTERED INTRANUCLEAR GROOVES. BASED ON THESE FEATURES, A FOLLICULAR LESION/NEOPLASM CAN NOT BE ENTIRELY RULED OUT. Pecola LeisureJOSHUA KISH MD Pathologist, Electronic Signature (Case signed 06/30/2014) Specimen Clinical Information Solid 2.7 x 1.6 x 2.3cm heterogeneous nodule Source Thyroid, Fine Needle Aspiration, Isthmus, (Specimen 1 of 4, collected on 06/29/2014)   Adequacy Reason Satisfactory For Evaluation. Diagnosis THYROID, FINE NEEDLE ASPIRATION (SPECIMEN 2 OF 4 COLLECTED 06-29-2014) ATYPIA OF UNDETERMINED SIGNIFICANCE OR FOLLICULAR LESION OF UNDETERMINED SIGNIFICANCE (BETHESDA CATEGORY III). SEE COMMENT. COMMENT: THE SPECIMEN IS SOMEWHAT HYPOCELLULAR, HINDERING OPTIMAL CYTOLOGIC EVALUATION. HOWEVER, THERE ARE SCATTERED SMALL GROUPS OF FOLLICULAR EPITHELIAL CELLS WITH MILD CYTOLOGIC ATYPIA, INCLUDING CONSPICUOUS INTRANUCLEAR GROOVES. BASED ON THESE FEATURES, A FOLLICULAR LESION/NEOPLASM CAN NOT BE ENTIRELY RULED OUT. Pecola LeisureJOSHUA KISH MD Pathologist, Electronic Signature (Case signed 06/30/2014) Specimen Clinical Information Right interpolar region nodule measures 3.0 x 1.9 x 2.8cm, It is predominantely solid with a small cystic area Source Thyroid, Fine Needle Aspiration, Right, (Specimen 2 of 4, collected on 06/29/2014)   Adequacy Reason Satisfactory For Evaluation. Diagnosis FINE NEEDLE ASPIRATION, THYROID, LLP (SPECIMEN 3 OF 4 COLLECTED ON 06/29/14): CONSISTENT WITH BENIGN FOLLICULAR NODULE (BETHESDA CATEGORY II). Pecola LeisureJOSHUA KISH MD Pathologist, Electronic Signature (Case signed 06/30/2014) Specimen Clinical Information Smaller llp nodule 1.5 x 1.4 x 1.3cm Source Thyroid, Fine Needle Aspiration, LLP, (Specimen 3 of 4, collected on 06/29/2014)   Adequacy Reason Satisfactory But Limited For Evaluation, Scant Cellularity. Diagnosis THYROID, FINE  NEEDLE ASPIRATION: LEFT INTERPOLAR (4 OF 4 COLLECTED ON 06/29/2014) SCANT FOLLICULAR EPITHELIUM PRESENT (BETHESDA CATEGORY I). FINDINGS CONSISTENT WITH THE CONTENTS OF A CYST. Pecola LeisureJOSHUA KISH MD Pathologist, Electronic Signature (Case signed 06/30/2014) Specimen Clinical Information 2.8 x 2.2 x 2.3cm interpolar region solid heterogeneous nodule Source Thyroid, Fine Needle Aspiration, Left Interpolar, (Specimen 4 of 4, collected on 06/29/2014)  (07/27/2015): Adequacy Reason Satisfactory For Evaluation. Diagnosis THYROID, FINE NEEDLE ASPIRATION RIGHT MID POLE, (SPECIMEN 1 OF 2 COLLECTED 07/26/2015) CONSISTENT WITH BENIGN FOLLICULAR NODULE (BETHESDA CATEGORY II). Pecola LeisureJOSHUA KISH MD Pathologist, Electronic Signature (Case signed 07/27/2015) Specimen Clinical Information Previous biospy 06/29/14, Dominant 33 x 25 x 31 mm mostly solid nodule, right mid lobe (previously 30 x 19 x 28) Source Thyroid, Fine Needle Aspiration, RMP, (Specimen 1 of 2, collected on 07/26/2015)  Adequacy Reason Satisfactory For Evaluation. Diagnosis THYROID, FINE NEEDLE ASPIRATION, RIGHT ISTHMUS (SPECIMEN 2 OF 2 COLLECTED 07-26-2015) ATYPIA OF UNDETERMINED SIGNIFICANCE OR FOLLICULAR LESION OF UNDETERMINED SIGNIFICANCE (BETHESDA CATEGORY III). SEE COMMENT. COMMENT: THE SPECIMEN IS MILDLY CELLULAR AND CONSISTS OF SMALL AND MEDIUM SIZED GROUPS OF FOLLICULAR EPITHELIAL CELLS WITH MINIMAL  CYTOLOGIC ATYPIA INCLUDING NUCLEAR OVERLAPPING. A SAMPLE WILL BE SENT FOR AFFIRMA TESTING AND THE RESULTS REPORTED SEPARATELY. Pecola Leisure MD Pathologist, Electronic Signature (Case signed 07/27/2015) Specimen Clinical Information Dominant 27 x 17 x 23 mm slightly echogenic solid nodule, right of midline (previously 27 x 16 x 23) Source Thyroid, Fine Needle Aspiration, Right Isthmus, (Specimen 2 of 2, collected on 07/26/2015)  The R thyroid nodule is benign.  The isthmic nodule is still a AUS/FLUS, but Afirma result is BENIGN. (MTC  negative).   Thyroid ultrasound (11/26/2017): Thyroid nodules are stable or smaller: CLINICAL DATA: Follow-up thyroid nodules. Four thyroid nodules were biopsied on 06/29/2014. Repeat biopsy of the isthmus and right thyroid nodule on 07/23/2017.  EXAM: THYROID ULTRASOUND  TECHNIQUE: Ultrasound examination of the thyroid gland and adjacent soft tissues was performed.  COMPARISON: 07/02/2015  FINDINGS: Parenchymal Echotexture: Markedly heterogenous  Isthmus: 0.5 cm, previously 1.1 cm  Right lobe: 5.2 x 2.3 x 2.9 cm, previously 5.8 x 3.3 x 3.2 cm  Left lobe: 5.2 x 2.0 x 2.3 cm, previously 5.9 x 2.5 x 2.5 cm  _________________________________________________________  Estimated total number of nodules >/= 1 cm: 4  Number of spongiform nodules >/= 2 cm not described below (TR1): 0  Number of mixed cystic and solid nodules >/= 1.5 cm not described below (TR2): 0  _________________________________________________________  Previous biopsy of the isthmus nodule, right thyroid nodule and the 2 left thyroid nodules.  The isthmus nodule is heterogeneous and appears to have some peripheral calcifications. The isthmus nodule measures 2.5 x 1.3 x 2.0 cm and previously measured 2.7 x 1.7 x 2.3 cm.  Heterogeneous isoechoic nodule in the mid right thyroid lobe measures 3.2 x 2.0 x 2.9 cm and previously measured 3.3 x 2.5 x 3.1 cm. Cannot exclude a few macro calcifications within this right thyroid nodule.  Heterogeneous isoechoic nodule in the mid left thyroid lobe measures 2.9 x 2.0 x 2.3 cm and previously measured 3.2 x 2.1 x 2.4 cm.  Slightly hypoechoic nodule in the inferior left thyroid lobe measures 1.4 x 1.4 x 1.5 cm and previously measured 1.4 x 1.2 x 1.4 cm. Question macrocalcification along the anterior aspect of this nodule.  IMPRESSION: Multinodular goiter. The previously biopsied dominant nodules have minimally changed in size.  No new suspicious thyroid  nodules.   Electronically Signed By: Richarda Overlie M.D. On: 11/26/2017 10:45   3. HL  PLAN:  1. DM2 - Patient with longstanding, uncontrolled, type 2 diabetes, on oral antidiabetic regimen, and long-acting insulin, previously also on GLP-1 receptor agonist, which she stopped before due to intolerance.  At last visit we discussed about restarting a lower dose of Ozempic and adding back Invokana, and we also switched from Lantus to [place] as she was working shift work. -She was again lost for follow-up after our last visit and she returns now after almost a year, with even higher blood sugars, after a long period of her medicines.  She is not usually compliant with appointments, medication doses or blood sugar checks.  At this visit, we discussed that if she does not start taking her medication consistently, check sugars, and come for her appointment, I am not able to help her and there is no point in scheduling another appointment.  If she misses next appointment, I will discharge her from the clinic. -At this visit, we will add back [place] and try to start rapid acting insulin.  We will continue off GLP-1 receptor agonist and SGLT2 inhibitor to avoid  running into problems with affording these medicines.  I advised her and to let me know if she cannot get Humalog in which case we can call in NovoLog and also to let me know if her sugars are not controlled after starting the medicines so that we can adjust the doses. -I advised her to: Patient Instructions  Please continue: - Metformin 1000 mg 2x a day with meals. - Glipizide XL 10 mg in am.    Restart: - Tresiba 20 units for 3 days, then increase to 30 units in 3 days, then 40 units daily  Try to start: - Humalog 8-10 units 15 min before meals  Please return in 3 months with your sugar log.    - we checked her HbA1c 13.3%-even higher than before - advised to check sugars at different times of the day - 3x a day, rotating check  times - advised for yearly eye exams >> she is not UTD - return to clinic in 3 months    2. MNG  -Patient with a history of multinodular goiter, with the right and isthmic thyroid nodules initially indeterminant of first biopsy.  On the second biopsy the right thyroid nodule was benign, while the isthmic nodule was again indeterminate.  The [facility] molecular marker test was benign, though, so in this case, the nodule is likely benign -Latest thyroid ultrasound from 11/2017 shows stable nodules, some of them even smaller -Latest TSH was normal in 2019 -She denies neck compression symptoms -No intervention needed for now, but we will check another ultrasound at next visit.  3. HL -Reviewed latest lipid panel from 06/2019: LDL extremely high, in the 300s!!!.  Triglycerides also high, HDL at goal Lab Results  Component Value Date   CHOL 418 (H) 06/21/2019   HDL 40.70 06/21/2019   LDLCALC 88 01/18/2018   LDLDIRECT 317.0 06/21/2019   TRIG 331.0 (H) 06/21/2019   CHOLHDL 10 06/21/2019  -After the above panel returned, she restarted Lipitor, but she was off Lipitor before the above labs -She needs a referral to the lipid clinic for starting a PCSK9 inhibitor after the next lipid panel returns, after she takes Lipitor consistently.  She has an appointment with PCP in 2 months.  Philemon Kingdom, MD PhD [facility]

## 2019-07-28 NOTE — Patient Instructions (Addendum)
Please continue: - Metformin 1000 mg 2x a day with meals. - Glipizide XL 10 mg in am.    Restart: - Tresiba 20 units for 3 days, then increase to 30 units in 3 days, then 40 units daily  Try to start: - Humalog 8-10 units 15 min before meals  Please return in 3 months with your sugar log.

## 2019-08-01 ENCOUNTER — Telehealth: Payer: Self-pay

## 2019-08-01 MED ORDER — NOVOLOG FLEXPEN 100 UNIT/ML ~~LOC~~ SOPN: 8.0000 [IU] | PEN_INJECTOR | Freq: Three times a day (TID) | SUBCUTANEOUS | 11 refills | Status: DC

## 2019-08-01 NOTE — Telephone Encounter (Signed)
Insurance does not cover [employer] prefers:  Novolog  Please advise if you would like to change RX or do PA.

## 2019-08-01 NOTE — Telephone Encounter (Signed)
RX changed and sent to pharmacy, patient notified.

## 2019-08-01 NOTE — Telephone Encounter (Signed)
OK to change?

## 2019-08-03 ENCOUNTER — Other Ambulatory Visit: Payer: Self-pay

## 2019-08-03 ENCOUNTER — Ambulatory Visit
Admission: RE | Admit: 2019-08-03 | Discharge: 2019-08-03 | Disposition: A | Discharge status: Home / Self Care | Payer: 59 | Source: Ambulatory Visit | Attending: Family Medicine | Admitting: Family Medicine

## 2019-08-03 DIAGNOSIS — Z1231 Encounter for screening mammogram for malignant neoplasm of breast: Secondary | ICD-10-CM

## 2019-09-05 ENCOUNTER — Other Ambulatory Visit: Payer: Self-pay | Admitting: Family Medicine

## 2019-09-13 ENCOUNTER — Other Ambulatory Visit: Payer: Self-pay | Admitting: Internal Medicine

## 2019-09-21 LAB — HM DIABETES EYE EXAM

## 2019-09-22 ENCOUNTER — Encounter: Payer: Self-pay | Admitting: Internal Medicine

## 2019-09-27 ENCOUNTER — Encounter: Payer: Self-pay | Admitting: Family Medicine

## 2019-09-30 ENCOUNTER — Ambulatory Visit: Payer: 59 | Admitting: Family Medicine

## 2019-10-05 ENCOUNTER — Other Ambulatory Visit: Payer: Self-pay | Admitting: Family Medicine

## 2019-10-06 NOTE — Telephone Encounter (Signed)
Patient called in wanting to know if she could ger a bridge of medication until her visit.    lamoTRIgine (LAMICTAL) 200 MG tablet    omeprazole (PRILOSEC) 40 MG capsule  metoprolol succinate (TOPROL-XL) 25 MG 24 hr tablet  [id number] - [place], [place] - [address]

## 2019-10-07 ENCOUNTER — Telehealth: Payer: Self-pay

## 2019-10-07 MED ORDER — METOPROLOL SUCCINATE ER 25 MG PO TB24: 25.0000 mg | ORAL_TABLET | Freq: Every day | ORAL | 0 refills | Status: DC

## 2019-10-07 NOTE — Telephone Encounter (Signed)
Pharmacy called in wanting to make sure patient was ok to take this medication. Says the medication was a high dosage for her to start on. That it could put her in the hospital. Wanted to know if she had been on medication in the past.  lamoTRIgine (LAMICTAL) 200 MG tablet   Please call pharmacy and advise

## 2019-10-07 NOTE — Telephone Encounter (Signed)
LM for pt to returncall

## 2019-10-07 NOTE — Telephone Encounter (Signed)
RX's have been sent into pharmacy for # 30 day supply.  Patient should now have enough to last until next OV.

## 2019-10-07 NOTE — Telephone Encounter (Signed)
Patient titrated back on dose after 07/04/19 visit with PCP. Most recent refill 10/07/19 #30. Patient okay to continue taking ?  Please advise, thanks.

## 2019-10-07 NOTE — Addendum Note (Signed)
Addended by: Eulah Pont on: 10/07/2019 08:10 AM   Modules accepted: Orders

## 2019-10-07 NOTE — Telephone Encounter (Signed)
Need more detailed info: did she STOP the med back in January or just cut back on dose? If just cut back on dose, how much did she cut back to. Has she been taking it regularly up until her latest refill (10/07/19)? Let me know ASAP-thx

## 2019-10-11 NOTE — Telephone Encounter (Signed)
LM for pt to returncall

## 2019-10-12 NOTE — Telephone Encounter (Signed)
If patient calls back, please confirm dose and frequency for Lamotrigine.

## 2019-10-19 ENCOUNTER — Other Ambulatory Visit: Payer: Self-pay

## 2019-10-19 MED ORDER — FLUCONAZOLE 150 MG PO TABS: ORAL_TABLET | ORAL | 1 refills | Status: DC

## 2019-10-19 NOTE — Telephone Encounter (Signed)
Patient is calling stating she has a yeast infection and requestng to get meds called in . Doesn't want anything over the counter - doesn't work, has tried, not successful, Dr.McGowen has treated her before for this It usually takes more than 1  Round, so she is requesting 2 pils preferably  [facility] [phone] Vil[address] Boehringer, [place] - [address]  [address] [place] [address]  Phone:  [phone] Fax:  [phone]  DEA #:  [id number]

## 2019-10-19 NOTE — Telephone Encounter (Signed)
Pt was called and notified

## 2019-10-19 NOTE — Telephone Encounter (Signed)
Pt was last given Rx for diflucan on 07/20/19(1,3). Had refills but Rx expired. Would like renewal of this but would like 2 pills instead of 1.  Please advise, thanks. Medication pending

## 2019-10-21 ENCOUNTER — Other Ambulatory Visit: Payer: Self-pay

## 2019-10-25 ENCOUNTER — Other Ambulatory Visit: Payer: Self-pay

## 2019-10-25 ENCOUNTER — Encounter: Payer: Self-pay | Admitting: Internal Medicine

## 2019-10-25 ENCOUNTER — Ambulatory Visit: Payer: 59 | Admitting: Internal Medicine

## 2019-10-25 VITALS — BP 142/80 | HR 78 | Ht 65.0 in | Wt 230.0 lb

## 2019-10-25 DIAGNOSIS — E1165 Type 2 diabetes mellitus with hyperglycemia: Secondary | ICD-10-CM

## 2019-10-25 DIAGNOSIS — Z794 Long term (current) use of insulin: Secondary | ICD-10-CM

## 2019-10-25 DIAGNOSIS — E042 Nontoxic multinodular goiter: Secondary | ICD-10-CM | POA: Diagnosis not present

## 2019-10-25 DIAGNOSIS — E785 Hyperlipidemia, unspecified: Secondary | ICD-10-CM

## 2019-10-25 LAB — POCT GLYCOSYLATED HEMOGLOBIN (HGB A1C): Hemoglobin A1C: 9.5 % — AB (ref 4.0–5.6)

## 2019-10-25 MED ORDER — NOVOLOG FLEXPEN 100 UNIT/ML ~~LOC~~ SOPN: 14.0000 [IU] | PEN_INJECTOR | Freq: Three times a day (TID) | SUBCUTANEOUS | 11 refills | Status: DC

## 2019-10-25 MED ORDER — TRESIBA FLEXTOUCH 200 UNIT/ML ~~LOC~~ SOPN: PEN_INJECTOR | SUBCUTANEOUS | 5 refills | Status: DC

## 2019-10-25 NOTE — Patient Instructions (Addendum)
Please continue: - Metformin 1000 mg 2x a day with meals. - Glipizide XL 10 mg in am.    Please increase: - Tresiba 46 >> 50 units daily - Novolog 10 >> 14-18 units 15 min before meals  Targets for blood sugars: - 80-130 before meals - <180 after meals  Please schedule an appt with Oran Rein with nutrition.  Please return in 3 months with your sugar log.

## 2019-10-25 NOTE — Progress Notes (Addendum)
Patient ID: Kristi Guerrero, female   DOB: 06-Apr-1961, 59 y.o.   MRN: [id number]   This visit occurred during the SARS-CoV-2 public health emergency.  Safety protocols were in place, including screening questions prior to the visit, additional usage of staff PPE, and extensive cleaning of exam room while observing appropriate contact time as indicated for disinfecting solutions.   HPI  Kristi Guerrero is a 59 y.o.-year-old female, returning for f/u for MNG and insulin-dependent DM2, dx'ed in the 1990s- had GDM x2, uncontrolled, insulin-dependent, with complications (diabetic retinopathy).  Last visit 3 months ago.  At last visit she returned after a long absence of almost a year, being off her medicines for almost the entire year and not checking blood sugars.  HbA1c was 13.3%.  She has a UTI >> on Macrobid.  DM2:  Reviewed HbA1c levels: Lab Results  Component Value Date   HGBA1C 13.3 (A) 07/28/2019   HGBA1C 11.6 (A) 08/26/2018   HGBA1C 8.3 (A) 02/08/2018   HGBA1C 10.3 (A) 11/06/2017   HGBA1C 9.6 08/06/2017   HGBA1C 11.1 03/13/2017   HGBA1C 10.2 07/22/2016   HGBA1C 12.1 04/08/2016   HGBA1C 6.7 06/13/2015   HGBA1C 8.2 (H) 11/17/2014   HGBA1C 8.4 (H) 06/19/2014   HGBA1C 8.0 (H) 03/06/2014   HGBA1C 8.5 (H) 10/21/2013   HGBA1C 7.5 (H) 03/31/2013   HGBA1C 6.3 (H) 09/29/2012   HGBA1C 6.5 05/31/2012   HGBA1C 7.4 (H) 12/19/2011   Previously on: -  >> stopped 2 mo ago 2/2 pharmacy issues - Metformin 1000 mg 2x a day with meals. - Glipizide XL 10 mg in am.   -  >> we increased the dose but she had diarrhea and nausea >> stopped - Lantus 45-48 units at bedtime >> missed doses - off and on Tried Januvia >> expensive. Tried Byetta >> did not work well.  Tried Victoza >> nausea. She was on Actos in the past.  At last visit, she was on: - Metformin 1000 mg 2x a day with meals - just restarted 3 weeks ago - Glipizide XL 10 mg in am   - just restarted 3 weeks ago - Invokana 100 mg in am >>  did not get - Ozempic 0.5 mg weekly >> did not get - Tresiba 46 units daily >> stopped after last OV  I advised her to use the following regimen: - Metformin 1000 mg 2x a day with meals. - Glipizide XL 10 mg in am.   - Tresiba 20 units for 3 days, then increase to 30 units in 3 days, then 40 units daily >> 46 units - Novolog 8-10 units 15 min before meals   She is checking her sugars 1-2x a day: - am: 150-200 >> upper 200s >> 200s >> 250-280 - 2h after b'fast: n/c - lunch: 120s >> n/c - 2h after lunch: n/c - dinner: 112-130s >> mid200s >> n/c - 2h after dinner: 200 >>200-300s >> 300-400s >> 280-320 - bedtime: 180s >> n/c  No CKD. Last BUN/Cr: Lab Results  Component Value Date   BUN 11 06/21/2019   Lab Results  Component Value Date   CREATININE 0.62 06/21/2019  On lisinopril.  + HL. Last Lipid panel: Lab Results  Component Value Date   CHOL 418 (H) 06/21/2019   HDL 40.70 06/21/2019   LDLCALC 88 01/18/2018   LDLDIRECT 317.0 06/21/2019   TRIG 331.0 (H) 06/21/2019   CHOLHDL 10 06/21/2019  On Lipitor.  Last eye exam: 09/2019: + DR  No numbness and  tingling in her feet. Has a herniated disk >> numbness in toes since ~2009.    MNG: She was diagnosed with thyroid nodules in 2006.  She previously saw endocrinology in [place].  Reviewed history: Thyroid U/S (04/20/2014) - 4 nodules, 2 enlarged from previous U/S in 2006:  Right thyroid lobe: 5.5 x 2.6 x 3.3 cm. Complex interpolar region nodule measures 3.0 x 1.9 x 2.8 cm. It is predominately solid with asmall cystic area. On the previous report, this was described to be 1.5 x 1.1 x 1.7 cm.   Left thyroid lobe: 5.4 x 2.8 x 2.5 cm. 2.8 x 2.2 x 2.3 cm interpolar region solid heterogeneous nodule. This was previously described to measure 1.7 x 0.9 x 1.8 cm. Smaller lower pole nodule measures 1.5 x 1.4 x 1.3 cm there are some peripheral calcifications.   Isthmus Thickness: 4 mm. Solid 2.7 x 1.6 x 2.3 cm heterogeneous nodule.  On the previous report, measurements of this nodule were 2.7 x 2.4 x 1.7 cm.   Lymphadenopathy: None visualized.  We Bx'ed the 4 nodules on 07/01/2014: - 2 x FLUS - 2 x benign  Repeated Bx's of the FLUS nodules on 07/26/2015: - R nodule: benign - isthmic nodule: FLUS/AUS  Afirma test for isthmic nodule on 07/26/2015: Benign  Thyroid U/S (11/26/2017): Her thyroid nodules were stable or smaller   Latest TSH was normal: Lab Results  Component Value Date   TSH 1.00 06/21/2019    Pt denies: - feeling nodules in neck - hoarseness - dysphagia - choking - SOB with lying down  Pt does have a + FH of follicular thyroid cancer in mother.   Works in [employer].  She works third shift.  She is going to [place] next summer - follows a music group.  ROS: Constitutional: + Weight gain/no weight loss, no fatigue, no subjective hyperthermia, no subjective hypothermia Eyes: no blurry vision, no xerophthalmia ENT: no sore throat, + see HPI Cardiovascular: no CP/no SOB/no palpitations/no leg swelling Respiratory: no cough/no SOB/no wheezing Gastrointestinal: no N/no V/no D/no C/no acid reflux Musculoskeletal: no muscle aches/no joint aches Skin: no rashes, no hair loss Neurological: no tremors/no numbness/no tingling/no dizziness  I reviewed pt's medications, allergies, PMH, social hx, family hx, and changes were documented in the history of present illness. Otherwise, unchanged from my initial visit note.  Past Medical History:  Diagnosis Date  . Abnormal nuclear stress test 12/31/11   Low risk lexiscan, but left heart cath was recommended and this showed normal coronary arteries and normal LV function. (01/01/12)  . Anxiety    + ?mood disorder (awaiting old psych records as of 12/19/11.  . Carpal tunnel syndrome on both sides   . Chronic pain syndrome    low back pain  . Colon cancer screening    Pt did not return cologuard 10/2016.  She has chosen not to get colonoscopy as of  2018 f/u.  Pt did not return cologuard 09/2017.  Marland Kitchen Daytime somnolence    Nuvigil rx'd by her psychiatrist.  Pt reports that no sleep study has been done.  . DDD (degenerative disc disease), lumbar    Pt says she has hx of herniated disc that has resulted in some numbness in a few toes on left foot.  . 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  I have yet to see/hear any evidence of borderline personality d/o in her since she has been my pt.  . DM2 (diabetes mellitus, type 2) (HCC)    Managed by ENDO--Dr. Elvera Lennox.  +hx of GDM both pregnancies, dx'd with DM 2 in her 30s--denies hx of complications  with eyes, kidneys, nerves, or CV system.  . Hepatic steatosis   . History of vitamin D deficiency 2009  . Hyperlipidemia   . Hypertension   . Iron deficiency anemia 2013   Hx of: Likely occult upper GI bleeding.  Hb 8.6--came up to 12 at most recent check after getting on integra, which she now continues once daily.   iFOB was negative 01/13/12.  She says she had been taking excessive NSAIDs at the time.  She has never had a colonoscopy.  . Low back pain 12/19/2011  . Migraine syndrome   . Mixed urge and stress incontinence   . Multinodular goiter    Hx of multiple benign biopsies; repeated u/s's showed no change until the one on 04/2014.  FNA 06/2013 showed R and isthmus nodules intermediate so f/u bx 6 mo recommended.  Repeat bx 07/26/15 showed R lobe benign, isthmus atypical cells of undetermined signif,--detailed path analysis was done and it was BENIGN.  U/S 11/2017 -->NO CHANGE in nodules.  . Obesity   . Osteoarthritis 06/2019   tricompartmental, R knee->pt to see ortho  . Plantar fasciitis    Steroid injection by podiatrist 09/2017  . Renal cyst, acquired, right 2017  . Toe fracture, left 09/2017   Distal phalanx L  third toe (saw podiatrist)   Past Surgical History:  Procedure Laterality Date  . CARDIAC CATHETERIZATION  01/01/12   Normal coronaries and normal LV function.  Marland Kitchen CESAREAN SECTION     X 2  . FNA bx Thyroid nodules  06/29/14   x 4: some findings to support benign cyst and some "cellular atypica of indeterminate significance".--followed by Dr. Elvera Lennox.  Repeat bx 05/2015 BENIGN.   History   Social History Main Topics  . Smoking status: Never Smoker   . Smokeless tobacco: Never Used  . Alcohol Use: No  . Drug Use: No   Social History Narrative   Divorced, 2 grown children (one in [place] and one in [place]).  One grandchild.   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   No T/A/Ds.   Exercise: occ goes to [facility].   Currently living in [place], [place].   Current Outpatient Medications on File Prior to Visit  Medication Sig Dispense Refill  . aspirin 81 MG tablet Take 81 mg by mouth daily.    Marland Kitchen atorvastatin (LIPITOR) 80 MG tablet TAKE 1 TABLET BY MOUTH EVERY DAY 90 tablet 3  . busPIRone (BUSPAR) 5 MG tablet TAKE 1/2 TABLET BY MOUTH 3 TIMES A DAY 135 tablet 0  . butalbital-acetaminophen-caffeine (FIORICET) 50-325-40 MG tablet take 1 to 2 tablets by mouth twice a day if needed for headache (Patient not taking: Reported on 07/20/2019) 60 tablet 1  . dicyclomine (BENTYL) 10 MG capsule 1 tab po tid prn abd bloating/cramping 30 capsule 0  . DULoxetine (CYMBALTA) 60 MG capsule TAKE 1 CAPSULE BY MOUTH EVERY DAY 30 capsule 5  . fluconazole (DIFLUCAN) 150 MG tablet 1 tab po qd x 2 days 2 tablet 1  . glipiZIDE (GLUCOTROL XL) 5 MG 24 hr tablet TAKE 2 TABLETS BY MOUTH ONCE DAILY WITH BREAKFAST 60 tablet 6  .  glucose blood (ONETOUCH VERIO) test strip Use as instructed to check sugar 2 times daily 200 each 5  . hydrochlorothiazide (HYDRODIURIL) 25 MG tablet Take 1 tablet (25 mg total) by mouth daily. 30 tablet 5  . HYDROcodone-acetaminophen (NORCO/VICODIN) 5-325 MG tablet TAKE 1 TO 2  TABLETS EVERY 6 HOURS AS NEEDED FOR PAIN 120 tablet 0  . hydrocortisone (PROCTOSOL HC) 2.5 % rectal cream apply rectally twice a day 30 g 6  . insulin aspart (NOVOLOG FLEXPEN) 100 UNIT/ML FlexPen Inject 8-10 Units into the skin 3 (three) times daily before meals. 10 pen 11  . Insulin Pen Needle 31G X 4 MM MISC 1 each by Does not apply route 4 (four) times daily. 300 each 3  . lamoTRIgine (LAMICTAL) 200 MG tablet TAKE 1 TABLET BY MOUTH EVERY DAY 30 tablet 0  . Lancet Devices (ONE TOUCH DELICA LANCING DEV) MISC Use to check sugar 2 times daily 200 each 5  . lisinopril (ZESTRIL) 40 MG tablet Take 1 tablet (40 mg total) by mouth daily. 90 tablet 3  . LORazepam (ATIVAN) 0.5 MG tablet TAKE 2 TABLETS BY MOUTH AT BEDTIME 60 tablet 5  . meloxicam (MOBIC) 7.5 MG tablet Take 1 tablet (7.5 mg total) by mouth daily. 30 tablet 3  . metFORMIN (GLUCOPHAGE) 1000 MG tablet TAKE 1 TABLET BY MOUTH TWICE A DAY FOR DIABETES 180 tablet 3  . metoprolol succinate (TOPROL-XL) 25 MG 24 hr tablet Take 1 tablet (25 mg total) by mouth daily. 30 tablet 0  . Multiple Vitamin (MULTIVITAMIN) tablet Take 1 tablet by mouth daily.    . Multiple Vitamins-Minerals (PRESERVISION AREDS) CAPS Take 1 capsule by mouth daily.    . Omega-3 Fatty Acids (FISH OIL) 1000 MG CAPS Take 1 capsule by mouth at bedtime.    Marland Kitchen omeprazole (PRILOSEC) 40 MG capsule TAKE 1 CAPSULE BY MOUTH EVERY DAY 30 capsule 0  . oxybutynin (DITROPAN-XL) 10 MG 24 hr tablet TAKE 1 TABLET BY MOUTH EVERY DAY 30 tablet 5  . promethazine (PHENERGAN) 12.5 MG tablet 1-2 tabs po q6h prn nausea/vomiting 20 tablet 3  . TRESIBA FLEXTOUCH 200 UNIT/ML FlexTouch Pen INJECT 46 UNITS INTO THE SKIN DAILY. 9 pen 5   No current facility-administered medications on file prior to visit.   Allergies  Allergen Reactions  . Levofloxacin In D5w Hives  . Penicillins     Blistery rash  . Victoza [Liraglutide] Nausea Only   Family History  Problem Relation Age of Onset  . Cancer Mother         breast and follicular thyroid cancer  . Depression Mother   . Breast cancer Mother   . Diabetes Father   . Alcohol abuse Sister   . Hypertension Maternal Grandmother   . Hyperlipidemia Maternal Grandmother   . Heart disease Maternal Grandmother   . Stroke Maternal Grandmother   . Cancer Maternal Grandfather        colon cancer  . Stroke Maternal Grandfather   . Hypertension Maternal Grandfather   . Hyperlipidemia Maternal Grandfather   . Heart disease Maternal Grandfather    PE: BP (!) 142/80   Pulse 78   Ht 5\' 5"  (1.651 m)   Wt 230 lb (104.3 kg)   LMP 06/04/2013   SpO2 97%   BMI 38.27 kg/m  Body mass index is 36.11 kg/m. Wt Readings from Last 3 Encounters:  10/25/19 230 lb (104.3 kg)  07/28/19 217 lb (98.4 kg)  07/04/19 215 lb (97.5 kg)   Constitutional: overweight,  in NAD Eyes: PERRLA, EOMI, no exophthalmos ENT: moist mucous membranes, + right thyroid fullness and isthmic nodule palpable, no cervical lymphadenopathy Cardiovascular: RRR, No MRG Respiratory: CTA B Gastrointestinal: abdomen soft, NT, ND, BS+ Musculoskeletal: no deformities, strength intact in all 4 Skin: moist, warm, no rashes Neurological: no tremor with outstretched hands, DTR normal in all 4   Assessment: 1. DM2, insulin-dep., uncontrolled, with complications - DR  2. MNG - thyroid U/S (04/20/2014):   Adequacy Reason Satisfactory For Evaluation. Diagnosis THYROID, FINE NEEDLE ASPIRATION ISTHMUS, (SPECIMEN 1 OF 4, COLLECTED ON 06/29/2014) ATYPIA OF UNDETERMINED SIGNIFICANCE OR FOLLICULAR LESION OF UNDETERMINED SIGNIFICANCE (BETHESDA CATEGORY III). SEE COMMENT. COMMENT: THE SPECIMEN CONSISTS OF SMALL AND MEDIUM SIZED GROUPS OF FOLLICULAR EPITHELIAL CELLS AND SOME BACKGROUND COLLOID. SOME OF THE GROUPS OF CELLS ARE ARRANGED AS MICROFOLLICLES. THERE ARE SCATTERED INTRANUCLEAR GROOVES. BASED ON THESE FEATURES, A FOLLICULAR LESION/NEOPLASM CAN NOT BE ENTIRELY RULED OUT. Pecola LeisureJOSHUA KISH  MD Pathologist, Electronic Signature (Case signed 06/30/2014) Specimen Clinical Information Solid 2.7 x 1.6 x 2.3cm heterogeneous nodule Source Thyroid, Fine Needle Aspiration, Isthmus, (Specimen 1 of 4, collected on 06/29/2014)   Adequacy Reason Satisfactory For Evaluation. Diagnosis THYROID, FINE NEEDLE ASPIRATION (SPECIMEN 2 OF 4 COLLECTED 06-29-2014) ATYPIA OF UNDETERMINED SIGNIFICANCE OR FOLLICULAR LESION OF UNDETERMINED SIGNIFICANCE (BETHESDA CATEGORY III). SEE COMMENT. COMMENT: THE SPECIMEN IS SOMEWHAT HYPOCELLULAR, HINDERING OPTIMAL CYTOLOGIC EVALUATION. HOWEVER, THERE ARE SCATTERED SMALL GROUPS OF FOLLICULAR EPITHELIAL CELLS WITH MILD CYTOLOGIC ATYPIA, INCLUDING CONSPICUOUS INTRANUCLEAR GROOVES. BASED ON THESE FEATURES, A FOLLICULAR LESION/NEOPLASM CAN NOT BE ENTIRELY RULED OUT. Pecola LeisureJOSHUA KISH MD Pathologist, Electronic Signature (Case signed 06/30/2014) Specimen Clinical Information Right interpolar region nodule measures 3.0 x 1.9 x 2.8cm, It is predominantely solid with a small cystic area Source Thyroid, Fine Needle Aspiration, Right, (Specimen 2 of 4, collected on 06/29/2014)   Adequacy Reason Satisfactory For Evaluation. Diagnosis FINE NEEDLE ASPIRATION, THYROID, LLP (SPECIMEN 3 OF 4 COLLECTED ON 06/29/14): CONSISTENT WITH BENIGN FOLLICULAR NODULE (BETHESDA CATEGORY II). Pecola LeisureJOSHUA KISH MD Pathologist, Electronic Signature (Case signed 06/30/2014) Specimen Clinical Information Smaller llp nodule 1.5 x 1.4 x 1.3cm Source Thyroid, Fine Needle Aspiration, LLP, (Specimen 3 of 4, collected on 06/29/2014)   Adequacy Reason Satisfactory But Limited For Evaluation, Scant Cellularity. Diagnosis THYROID, FINE NEEDLE ASPIRATION: LEFT INTERPOLAR (4 OF 4 COLLECTED ON 06/29/2014) SCANT FOLLICULAR EPITHELIUM PRESENT (BETHESDA CATEGORY I). FINDINGS CONSISTENT WITH THE CONTENTS OF A CYST. Pecola LeisureJOSHUA KISH MD Pathologist, Electronic Signature (Case signed 06/30/2014) Specimen Clinical  Information 2.8 x 2.2 x 2.3cm interpolar region solid heterogeneous nodule Source Thyroid, Fine Needle Aspiration, Left Interpolar, (Specimen 4 of 4, collected on 06/29/2014)  (07/27/2015): Adequacy Reason Satisfactory For Evaluation. Diagnosis THYROID, FINE NEEDLE ASPIRATION RIGHT MID POLE, (SPECIMEN 1 OF 2 COLLECTED 07/26/2015) CONSISTENT WITH BENIGN FOLLICULAR NODULE (BETHESDA CATEGORY II). Pecola LeisureJOSHUA KISH MD Pathologist, Electronic Signature (Case signed 07/27/2015) Specimen Clinical Information Previous biospy 06/29/14, Dominant 33 x 25 x 31 mm mostly solid nodule, right mid lobe (previously 30 x 19 x 28) Source Thyroid, Fine Needle Aspiration, RMP, (Specimen 1 of 2, collected on 07/26/2015)  Adequacy Reason Satisfactory For Evaluation. Diagnosis THYROID, FINE NEEDLE ASPIRATION, RIGHT ISTHMUS (SPECIMEN 2 OF 2 COLLECTED 07-26-2015) ATYPIA OF UNDETERMINED SIGNIFICANCE OR FOLLICULAR LESION OF UNDETERMINED SIGNIFICANCE (BETHESDA CATEGORY III). SEE COMMENT. COMMENT: THE SPECIMEN IS MILDLY CELLULAR AND CONSISTS OF SMALL AND MEDIUM SIZED GROUPS OF FOLLICULAR EPITHELIAL CELLS WITH MINIMAL CYTOLOGIC ATYPIA INCLUDING NUCLEAR OVERLAPPING. A SAMPLE WILL BE SENT FOR AFFIRMA TESTING AND THE RESULTS REPORTED SEPARATELY. Pecola LeisureJOSHUA KISH MD Pathologist, Electronic  Signature (Case signed 07/27/2015) Specimen Clinical Information Dominant 27 x 17 x 23 mm slightly echogenic solid nodule, right of midline (previously 27 x 16 x 23) Source Thyroid, Fine Needle Aspiration, Right Isthmus, (Specimen 2 of 2, collected on 07/26/2015)  The R thyroid nodule is benign.  The isthmic nodule is still a AUS/FLUS, but Afirma result is BENIGN. (MTC negative).   Thyroid ultrasound (11/26/2017): Thyroid nodules are stable or smaller: CLINICAL DATA: Follow-up thyroid nodules. Four thyroid nodules were biopsied on 06/29/2014. Repeat biopsy of the isthmus and right thyroid nodule on 07/23/2017.  EXAM: THYROID  ULTRASOUND  TECHNIQUE: Ultrasound examination of the thyroid gland and adjacent soft tissues was performed.  COMPARISON: 07/02/2015  FINDINGS: Parenchymal Echotexture: Markedly heterogenous  Isthmus: 0.5 cm, previously 1.1 cm  Right lobe: 5.2 x 2.3 x 2.9 cm, previously 5.8 x 3.3 x 3.2 cm  Left lobe: 5.2 x 2.0 x 2.3 cm, previously 5.9 x 2.5 x 2.5 cm  _________________________________________________________  Estimated total number of nodules >/= 1 cm: 4  Number of spongiform nodules >/= 2 cm not described below (TR1): 0  Number of mixed cystic and solid nodules >/= 1.5 cm not described below (TR2): 0  _________________________________________________________  Previous biopsy of the isthmus nodule, right thyroid nodule and the 2 left thyroid nodules.  The isthmus nodule is heterogeneous and appears to have some peripheral calcifications. The isthmus nodule measures 2.5 x 1.3 x 2.0 cm and previously measured 2.7 x 1.7 x 2.3 cm.  Heterogeneous isoechoic nodule in the mid right thyroid lobe measures 3.2 x 2.0 x 2.9 cm and previously measured 3.3 x 2.5 x 3.1 cm. Cannot exclude a few macro calcifications within this right thyroid nodule.  Heterogeneous isoechoic nodule in the mid left thyroid lobe measures 2.9 x 2.0 x 2.3 cm and previously measured 3.2 x 2.1 x 2.4 cm.  Slightly hypoechoic nodule in the inferior left thyroid lobe measures 1.4 x 1.4 x 1.5 cm and previously measured 1.4 x 1.2 x 1.4 cm. Question macrocalcification along the anterior aspect of this nodule.  IMPRESSION: Multinodular goiter. The previously biopsied dominant nodules have minimally changed in size.  No new suspicious thyroid nodules.   Electronically Signed By: Richarda Overlie M.D. On: 11/26/2017 10:45   3. HL  PLAN:  1. DM2 - Patient with longstanding, uncontrolled, type 2 diabetes, on oral antidiabetic regimen and long-acting insulin, to which we added rapid acting insulin at last  visit.  She was on a GLP-1 receptor agonist (Ozempic), which she stopped due to intolerance.  She was noncompliant with appointments, sugar checks and medication doses in the past and I advised her at last visit that I am not able to help her if she does not start coming for the appointment consistently, taking her insulin and the rest of her medicines as advised, and checking sugars.  At last visit, we changed the Tresiba dose and added Humalog. -At this visit, sugars are improved, but still quite high.  She is occasionally missing insulin doses and we discussed about what to do if she skips a meal.  She needs to check her blood sugars and correct hypoglycemia with a lower amount of NovoLog if she does not plan to eat.  We will increase her Evaristo Bury and NovoLog doses now. -She also is interested in a referral to nutrition. -I advised her to: Patient Instructions  Please continue: - Metformin 1000 mg 2x a day with meals. - Glipizide XL 10 mg in am.    Please  increase: - Tresiba 46 >> 50 units daily - Novolog 10 >> 14-18 units 15 min before meals  Targets for blood sugars: - 80-130 before meals - <180 after meals  Please schedule an appt with Antonieta Iba with nutrition.  Please return in 3 months with your sugar log.    - we checked her HbA1c: 9.5% (better) - advised to check sugars at different times of the day - 3x a day, rotating check times - advised for yearly eye exams >> she is UTD - return to clinic in 3 months   2. MNG  -She has a history of multinodular goiter, with the right and isthmic thyroid nodules initially indeterminant of first biopsy.  On the second biopsy the right thyroid nodule was benign, while the isthmic nodule was again indeterminate.  The Afirma molecular marker test was benign, though, so in this case, the nodule is likely benign -Latest thyroid ultrasound from 11/2017 shows stable nodules, some of them even smaller -Latest TSH was normal -No neck compression  symptoms -Repeat ultrasound now  3. HL -Reviewed latest lipid panel from 06/2019: LDL again very high, in the 300s.  Triglycerides also high: Lab Results  Component Value Date   CHOL 418 (H) 06/21/2019   HDL 40.70 06/21/2019   LDLCALC 88 01/18/2018   LDLDIRECT 317.0 06/21/2019   TRIG 331.0 (H) 06/21/2019   CHOLHDL 10 06/21/2019  -She is intermittently compliant with Lipitor.  We discussed in the past about the referral to the lipid clinic for PCSK9 inhibitor.  However, at last visit, she was not taking Lipitor consistently and I advised her to start doing so before referring her. She is now taking it consistently >> has appt with PCP 11/07/2019.  Narrative & Impression    CLINICAL DATA:  59 year old female with a history of thyroid nodules. Prior biopsy right nodule and isthmic nodule in 2017, as well as prior right nodule and isthmic nodule 2016. In addition left-sided nodules biopsied 2016.  EXAM: THYROID ULTRASOUND  TECHNIQUE: Ultrasound examination of the thyroid gland and adjacent soft tissues was performed.  COMPARISON:  Multiple prior most recent 11/25/2017  FINDINGS: Parenchymal Echotexture: Markedly heterogenous  Isthmus: 1.2 cm  Right lobe: 5.8 cm x 2.8 cm x 3.1 cm  Left lobe: 6.1 cm x 2.3 cm x 2.3 cm  _________________________________________________________  Estimated total number of nodules >/= 1 cm: 1  Number of spongiform nodules >/=  2 cm not described below (TR1): 0  Number of mixed cystic and solid nodules >/= 1.5 cm not described below (TR2): 0  _________________________________________________________  Isthmic nodule slightly decreased in size from the prior, now 2.4 cm, previously 2.5 cm this nodule has been previously biopsied.  Similar appearance in the nodular appearance of thyroid, with prior biopsies of left and right nodules.  IMPRESSION: Unchanged appearance of multinodular thyroid.  The isthmic nodule is the only  nodule which is clearly defined on today's ultrasound. This nodule has been previously biopsied. Assuming benign result, no further specific follow-up would be  indicated.  Recommendations follow those established by the new ACR TI-RADS criteria (J Am Coll Radiol 1443;15:400-867).   Electronically Signed   By: Corrie Mckusick D.O.   On: 11/03/2019 09:10   Only 1 nodule seen now, which continues to decrease in size.  This nodule was biopsied in the past with inconclusive results, however, the Afirma molecular marker was benign.  The rest of the nodules were probably pseudonodules.  Philemon Kingdom, MD PhD [facility] Endocrinology

## 2019-10-30 ENCOUNTER — Other Ambulatory Visit: Payer: Self-pay | Admitting: Family Medicine

## 2019-10-31 NOTE — Telephone Encounter (Signed)
Confirmed patient is taking 200mg  dose daily.

## 2019-11-03 ENCOUNTER — Ambulatory Visit
Admission: RE | Admit: 2019-11-03 | Discharge: 2019-11-03 | Disposition: A | Discharge status: Home / Self Care | Payer: 59 | Source: Ambulatory Visit | Attending: Internal Medicine | Admitting: Internal Medicine

## 2019-11-03 ENCOUNTER — Other Ambulatory Visit: Payer: Self-pay | Admitting: Family Medicine

## 2019-11-03 DIAGNOSIS — E042 Nontoxic multinodular goiter: Secondary | ICD-10-CM

## 2019-11-07 ENCOUNTER — Other Ambulatory Visit: Payer: Self-pay

## 2019-11-07 ENCOUNTER — Ambulatory Visit: Payer: 59 | Admitting: Family Medicine

## 2019-11-07 ENCOUNTER — Encounter: Payer: Self-pay | Admitting: Family Medicine

## 2019-11-07 VITALS — BP 110/76 | HR 67 | Temp 97.9°F | Resp 16 | Ht 65.0 in | Wt 235.0 lb

## 2019-11-07 DIAGNOSIS — I10 Essential (primary) hypertension: Secondary | ICD-10-CM | POA: Diagnosis not present

## 2019-11-07 DIAGNOSIS — M545 Low back pain, unspecified: Secondary | ICD-10-CM

## 2019-11-07 DIAGNOSIS — E782 Mixed hyperlipidemia: Secondary | ICD-10-CM

## 2019-11-07 DIAGNOSIS — G894 Chronic pain syndrome: Secondary | ICD-10-CM | POA: Diagnosis not present

## 2019-11-07 DIAGNOSIS — F329 Major depressive disorder, single episode, unspecified: Secondary | ICD-10-CM

## 2019-11-07 DIAGNOSIS — F419 Anxiety disorder, unspecified: Secondary | ICD-10-CM

## 2019-11-07 DIAGNOSIS — G8929 Other chronic pain: Secondary | ICD-10-CM

## 2019-11-07 LAB — COMPREHENSIVE METABOLIC PANEL
ALT: 12 U/L (ref 0–35)
AST: 11 U/L (ref 0–37)
Albumin: 3.7 g/dL (ref 3.5–5.2)
Alkaline Phosphatase: 83 U/L (ref 39–117)
BUN: 12 mg/dL (ref 6–23)
CO2: 30 mEq/L (ref 19–32)
Calcium: 8.8 mg/dL (ref 8.4–10.5)
Chloride: 101 mEq/L (ref 96–112)
Creatinine, Ser: 0.54 mg/dL (ref 0.40–1.20)
GFR: 115.44 mL/min (ref >60.00)
Glucose, Bld: 182 mg/dL — ABNORMAL HIGH (ref 70–99)
Potassium: 3.9 mEq/L (ref 3.5–5.1)
Sodium: 140 mEq/L (ref 135–145)
Total Bilirubin: 0.5 mg/dL (ref 0.2–1.2)
Total Protein: 6.3 g/dL (ref 6.0–8.3)

## 2019-11-07 LAB — LIPID PANEL
Cholesterol: 171 mg/dL (ref 0–200)
HDL: 40.5 mg/dL (ref >39.00)
LDL Cholesterol: 102 mg/dL — ABNORMAL HIGH (ref 0–99)
NonHDL: 130.99
Total CHOL/HDL Ratio: 4
Triglycerides: 146 mg/dL (ref 0.0–149.0)
VLDL: 29.2 mg/dL (ref 0.0–40.0)

## 2019-11-07 NOTE — Progress Notes (Signed)
OFFICE VISIT  11/07/2019   CC:  Chief Complaint  Patient presents with  . Follow-up    RCI, pt is fasting    HPI:    Patient is a 59 y.o. Caucasian female who presents for 5 mo f/u chronic med illnesses.  Primary issues I follow her for are: chronic anxiety and depression, HTN, HLD, and chronic pain syndrome. She has DM 2 managed by Dr. Cruzita Lederer.  A/P as of last visit: "1) Medical noncompliance: primarily due to a flare of her chronic anxiety and depression. She is improved since getting back on all meds (psych meds, DM, HTN, HLD, GERD, chronic pain, and OAB). Once again, encouraged her to focus more on work life/home life balance->she tends to overwhelm herself with work to try to cope with chronic anx/dep.  2) Right knee pain: acute on chronic->recent steroid injection by ortho, plan for MRI knee."  INTERIM HX: Doing ok, working lots of shifts. Mood stable, anxiety level stable. No probs with lamictal, buspar, cymbalta, or lorazepam.  Indication for chronic opioid:chronic bilat LBP w/out radiculopathy.  Opioids rx'd to maximize quality of life and functioning. Medication and dose:Vicodin 5/325, 1-2q6h prn # pills per month: 120 but the last time she filled this med was1/5/21, #120, rx by me. Loraz 0.5mg  last RF was 09/05/19, #60, rx by me. Last UDS date:08/05/2018. Opioid Treatment Agreement signed (Y/N):06/21/19  Torn meniscus in R knee, ortho waiting for her BMI to get down before doing surgery. Her back pain bothers her a lot but not severe much. Waits until pain is severe before taking hydrocodone---not taking it much.  Meloxicam helps but she stopped b/c she was afraid of taking it long term/regularly. Takes tylenol bid. Fioricet for HAs some.  HTN: occ bp check at work/home <130/80 consistently.   UTI a few weeks ago, tx'd at [facility].  ROS: no fevers, no CP, no SOB, no wheezing, no cough, no dizziness, no HAs, no rashes, no melena/hematochezia.  No polyuria or  polydipsia.  No myalgias or arthralgias.  No focal weakness, paresthesias, or tremors.  No acute vision or hearing abnormalities. No n/v/d or abd pain.  No palpitations.    Past Medical History:  Diagnosis Date  . Abnormal nuclear stress test 12/31/11   Low risk lexiscan, but left heart cath was recommended and this showed normal coronary arteries and normal LV function. (01/01/12)  . Anxiety    + ?mood disorder (awaiting old psych records as of 12/19/11.  . Carpal tunnel syndrome on both sides   . Chronic pain syndrome    low back pain  . Colon cancer screening    Pt did not return cologuard 10/2016.  She has chosen not to get colonoscopy as of 2018 f/u.  Pt did not return cologuard 09/2017.  Marland Kitchen Daytime somnolence    Nuvigil rx'd by her psychiatrist.  Pt reports that no sleep study has been done.  . DDD (degenerative disc disease), lumbar    Pt says she has hx of herniated disc that has resulted in some numbness in a few toes on left foot.  . 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  I have yet to see/hear any evidence of borderline personality d/o in her since she has been my pt.  . DM2 (diabetes mellitus, type 2) ([place])  Managed by ENDO--Dr. Elvera Lennox.  +hx of GDM both pregnancies, dx'd with DM 2 in her 30s--denies hx of complications  with eyes, kidneys, nerves, or CV system.  . Hepatic steatosis   . History of vitamin D deficiency 2009  . Hyperlipidemia   . Hypertension   . Iron deficiency anemia 2013   Hx of: Likely occult upper GI bleeding.  Hb 8.6--came up to 12 at most recent check after getting on integra, which she now continues once daily.   iFOB was negative 01/13/12.  She says she had been taking excessive NSAIDs at the time.  She has never had a colonoscopy.  . Low back pain 12/19/2011  . Migraine  syndrome   . Mixed urge and stress incontinence   . Multinodular goiter    Hx of multiple benign biopsies; repeated u/s's showed no change until the one on 04/2014.  FNA 06/2013 showed R and isthmus nodules intermediate so f/u bx 6 mo recommended.  Repeat bx 07/26/15 showed R lobe benign, isthmus atypical cells of undetermined signif,--detailed path analysis was done and it was BENIGN.  U/S 11/2017 -->NO CHANGE in nodules.  . Obesity   . Osteoarthritis 06/2019   tricompartmental, R knee->pt to see ortho  . Plantar fasciitis    Steroid injection by podiatrist 09/2017  . Renal cyst, acquired, right 2017  . Toe fracture, left 09/2017   Distal phalanx L third toe (saw podiatrist)    Past Surgical History:  Procedure Laterality Date  . CARDIAC CATHETERIZATION  01/01/12   Normal coronaries and normal LV function.  Marland Kitchen CESAREAN SECTION     X 2  . FNA bx Thyroid nodules  06/29/14   x 4: some findings to support benign cyst and some "cellular atypica of indeterminate significance".--followed by Dr. Elvera Lennox.  Repeat bx 05/2015 BENIGN.    Outpatient Medications Prior to Visit  Medication Sig Dispense Refill  . aspirin 81 MG tablet Take 81 mg by mouth daily.    Marland Kitchen atorvastatin (LIPITOR) 80 MG tablet TAKE 1 TABLET BY MOUTH EVERY DAY 90 tablet 3  . busPIRone (BUSPAR) 5 MG tablet TAKE 1/2 TABLET BY MOUTH 3 TIMES A DAY 135 tablet 0  . butalbital-acetaminophen-caffeine (FIORICET) 50-325-40 MG tablet take 1 to 2 tablets by mouth twice a day if needed for headache 60 tablet 1  . DULoxetine (CYMBALTA) 60 MG capsule TAKE 1 CAPSULE BY MOUTH EVERY DAY 30 capsule 5  . fluconazole (DIFLUCAN) 150 MG tablet 1 tab po qd x 2 days 2 tablet 1  . glipiZIDE (GLUCOTROL XL) 5 MG 24 hr tablet TAKE 2 TABLETS BY MOUTH ONCE DAILY WITH BREAKFAST 60 tablet 6  . glucose blood (ONETOUCH VERIO) test strip Use as instructed to check sugar 2 times daily 200 each 5  . hydrochlorothiazide (HYDRODIURIL) 25 MG tablet Take 1 tablet (25 mg  total) by mouth daily. 30 tablet 5  . HYDROcodone-acetaminophen (NORCO/VICODIN) 5-325 MG tablet TAKE 1 TO 2 TABLETS EVERY 6 HOURS AS NEEDED FOR PAIN 120 tablet 0  . insulin aspart (NOVOLOG FLEXPEN) 100 UNIT/ML FlexPen Inject 14-18 Units into the skin 3 (three) times daily before meals. 10 pen 11  . insulin degludec (TRESIBA FLEXTOUCH) 200 UNIT/ML FlexTouch Pen INJECT 50 UNITS INTO THE SKIN DAILY. 9 pen 5  . Insulin Pen Needle 31G X 4 MM MISC 1 each by Does not apply route 4 (four) times daily. 300 each 3  . lamoTRIgine (LAMICTAL) 200 MG tablet TAKE 1 TABLET BY MOUTH EVERY DAY 30  tablet 0  . Lancet Devices (ONE TOUCH DELICA LANCING DEV) MISC Use to check sugar 2 times daily 200 each 5  . lisinopril (ZESTRIL) 40 MG tablet Take 1 tablet (40 mg total) by mouth daily. 90 tablet 3  . LORazepam (ATIVAN) 0.5 MG tablet TAKE 2 TABLETS BY MOUTH AT BEDTIME 60 tablet 5  . meloxicam (MOBIC) 7.5 MG tablet Take 1 tablet (7.5 mg total) by mouth daily. 30 tablet 3  . metFORMIN (GLUCOPHAGE) 1000 MG tablet TAKE 1 TABLET BY MOUTH TWICE A DAY FOR DIABETES 180 tablet 3  . metoprolol succinate (TOPROL-XL) 25 MG 24 hr tablet TAKE 1 TABLET BY MOUTH EVERY DAY 30 tablet 0  . Multiple Vitamin (MULTIVITAMIN) tablet Take 1 tablet by mouth daily.    . Omega-3 Fatty Acids (FISH OIL) 1000 MG CAPS Take 1 capsule by mouth at bedtime.    Marland Kitchen omeprazole (PRILOSEC) 40 MG capsule TAKE 1 CAPSULE BY MOUTH EVERY DAY 30 capsule 0  . oxybutynin (DITROPAN-XL) 10 MG 24 hr tablet TAKE 1 TABLET BY MOUTH EVERY DAY 30 tablet 5  . dicyclomine (BENTYL) 10 MG capsule 1 tab po tid prn abd bloating/cramping (Patient not taking: Reported on 11/07/2019) 30 capsule 0  . hydrocortisone (PROCTOSOL HC) 2.5 % rectal cream apply rectally twice a day (Patient not taking: Reported on 11/07/2019) 30 g 6  . promethazine (PHENERGAN) 12.5 MG tablet 1-2 tabs po q6h prn nausea/vomiting (Patient not taking: Reported on 11/07/2019) 20 tablet 3  . Multiple Vitamins-Minerals  (PRESERVISION AREDS) CAPS Take 1 capsule by mouth daily.     No facility-administered medications prior to visit.    Allergies  Allergen Reactions  . Levofloxacin In D5w Hives  . Penicillins     Blistery rash  . Victoza [Liraglutide] Nausea Only    ROS As per HPI  PE: Vitals with BMI 11/07/2019 10/25/2019 07/28/2019  Height 5\' 5"  5\' 5"  5\' 5"   Weight 235 lbs 230 lbs 217 lbs  BMI 39.11 38.27 36.11  Systolic 110 142  Diastolic 76 80 70  Pulse 67 78 77    Gen: Alert, well appearing.  Patient is oriented to person, place, time, and situation. AFFECT: pleasant, lucid thought and speech. CV: RRR, no m/r/g.   LUNGS: CTA bilat, nonlabored resps, good aeration in all lung fields. EXT: no clubbing or cyanosis.  1-2 + pitting edema, R>L.    LABS:  Lab Results  Component Value Date   TSH 1.00 06/21/2019   Lab Results  Component Value Date   WBC 5.9 06/21/2019   HGB 15.5 (H) 06/21/2019   HCT 45.4 06/21/2019   MCV 91.0 06/21/2019   PLT 218.0 06/21/2019   Lab Results  Component Value Date   CREATININE 0.62 06/21/2019   BUN 11 06/21/2019   NA 135 06/21/2019   K 3.9 06/21/2019   CL 96 06/21/2019   CO2 28 06/21/2019   Lab Results  Component Value Date   ALT 10 06/21/2019   AST 10 06/21/2019   ALKPHOS 123 (H) 06/21/2019   BILITOT 0.6 06/21/2019   Lab Results  Component Value Date   CHOL 418 (H) 06/21/2019   Lab Results  Component Value Date   HDL 40.70 06/21/2019   Lab Results  Component Value Date   LDLCALC 88 01/18/2018   Lab Results  Component Value Date   TRIG 331.0 (H) 06/21/2019   Lab Results  Component Value Date   CHOLHDL 10 06/21/2019   Lab Results  Component Value Date  HGBA1C 9.5 (A) 10/25/2019   IMPRESSION AND PLAN:  1) Chronic anxiety and depression: stable on cymbalta, lamictal, buspar, and lorazepam.  2) Chronic pain syndrome: bilat LBP w/out radiculopathy +chronic R knee pain/DJD. Followed by ortho for knee--no surg until BMI  drops. No new rx for vicodin needed today.  3) HTN: The current medical regimen is effective;  continue present plan and medications. Lytes/cr today.  4) HLD: tolerating high dose statin. FLP and hepatic panel today. If LDL not <70 this time then will refer to [facility] lipids clinic for consideration of PCSK9 inhibitor.  5) DM 2, poor control Mgmt per Dr. Elvera Lennox.  An After Visit Summary was printed and given to the patient.  FOLLOW UP: Return in about 3 months (around 02/07/2020) for annual CPE (fasting).  Signed:  Santiago Bumpers, MD           11/07/2019

## 2019-11-08 ENCOUNTER — Other Ambulatory Visit: Payer: Self-pay

## 2019-11-08 DIAGNOSIS — E782 Mixed hyperlipidemia: Secondary | ICD-10-CM

## 2019-11-09 ENCOUNTER — Other Ambulatory Visit: Payer: Self-pay | Admitting: Family Medicine

## 2019-11-09 ENCOUNTER — Other Ambulatory Visit: Payer: Self-pay

## 2019-11-09 MED ORDER — DULOXETINE HCL 60 MG PO CPEP: ORAL_CAPSULE | ORAL | 1 refills | Status: DC

## 2019-11-12 ENCOUNTER — Other Ambulatory Visit: Payer: Self-pay | Admitting: Family Medicine

## 2019-11-16 ENCOUNTER — Other Ambulatory Visit: Payer: Self-pay | Admitting: Internal Medicine

## 2019-11-30 ENCOUNTER — Other Ambulatory Visit: Payer: Self-pay | Admitting: Family Medicine

## 2019-12-01 ENCOUNTER — Other Ambulatory Visit: Payer: Self-pay | Admitting: Family Medicine

## 2019-12-06 ENCOUNTER — Encounter: Payer: Self-pay | Admitting: General Practice

## 2019-12-15 ENCOUNTER — Other Ambulatory Visit: Payer: Self-pay | Admitting: Family Medicine

## 2019-12-19 ENCOUNTER — Other Ambulatory Visit: Payer: Self-pay | Admitting: Family Medicine

## 2020-02-02 ENCOUNTER — Ambulatory Visit: Payer: 59 | Admitting: Internal Medicine

## 2020-02-15 ENCOUNTER — Encounter: Payer: 59 | Admitting: Family Medicine

## 2020-02-28 ENCOUNTER — Other Ambulatory Visit: Payer: Self-pay | Admitting: Family Medicine

## 2020-02-29 NOTE — Telephone Encounter (Signed)
RF request for lorazepam.  Last RX 06/21/19 # 60 x 5 rfs. Last OV 11/07/19. No upcoming OV.  Patient also requesting Lamictal.  Unsure if this is long term.  Please advise.

## 2020-03-16 DIAGNOSIS — Z8616 Personal history of COVID-19: Secondary | ICD-10-CM

## 2020-03-16 HISTORY — DX: Personal history of COVID-19: Z86.16

## 2020-03-30 ENCOUNTER — Other Ambulatory Visit: Payer: Self-pay | Admitting: Family Medicine

## 2020-09-21 LAB — HM DIABETES EYE EXAM

## 2021-04-01 ENCOUNTER — Ambulatory Visit (INDEPENDENT_AMBULATORY_CARE_PROVIDER_SITE_OTHER): Payer: Self-pay | Admitting: Family Medicine

## 2021-04-01 ENCOUNTER — Other Ambulatory Visit: Payer: Self-pay

## 2021-04-01 ENCOUNTER — Encounter: Payer: Self-pay | Admitting: Family Medicine

## 2021-04-01 VITALS — BP 130/80 | HR 76 | Temp 98.4°F | Ht 65.75 in | Wt 215.6 lb

## 2021-04-01 DIAGNOSIS — F411 Generalized anxiety disorder: Secondary | ICD-10-CM

## 2021-04-01 DIAGNOSIS — Z79899 Other long term (current) drug therapy: Secondary | ICD-10-CM

## 2021-04-01 DIAGNOSIS — I10 Essential (primary) hypertension: Secondary | ICD-10-CM

## 2021-04-01 DIAGNOSIS — G894 Chronic pain syndrome: Secondary | ICD-10-CM

## 2021-04-01 DIAGNOSIS — E118 Type 2 diabetes mellitus with unspecified complications: Secondary | ICD-10-CM

## 2021-04-01 DIAGNOSIS — Z0000 Encounter for general adult medical examination without abnormal findings: Secondary | ICD-10-CM

## 2021-04-01 DIAGNOSIS — G5793 Unspecified mononeuropathy of bilateral lower limbs: Secondary | ICD-10-CM

## 2021-04-01 DIAGNOSIS — F3341 Major depressive disorder, recurrent, in partial remission: Secondary | ICD-10-CM

## 2021-04-01 DIAGNOSIS — G8929 Other chronic pain: Secondary | ICD-10-CM

## 2021-04-01 DIAGNOSIS — Z23 Encounter for immunization: Secondary | ICD-10-CM

## 2021-04-01 DIAGNOSIS — Z1211 Encounter for screening for malignant neoplasm of colon: Secondary | ICD-10-CM

## 2021-04-01 DIAGNOSIS — M545 Low back pain, unspecified: Secondary | ICD-10-CM

## 2021-04-01 DIAGNOSIS — Z1231 Encounter for screening mammogram for malignant neoplasm of breast: Secondary | ICD-10-CM

## 2021-04-01 DIAGNOSIS — E7800 Pure hypercholesterolemia, unspecified: Secondary | ICD-10-CM

## 2021-04-01 LAB — COMPREHENSIVE METABOLIC PANEL
ALT: 7 U/L (ref 0–35)
AST: 8 U/L (ref 0–37)
Albumin: 3.7 g/dL (ref 3.5–5.2)
Alkaline Phosphatase: 106 U/L (ref 39–117)
BUN: 12 mg/dL (ref 6–23)
CO2: 25 mEq/L (ref 19–32)
Calcium: 8.9 mg/dL (ref 8.4–10.5)
Chloride: 100 mEq/L (ref 96–112)
Creatinine, Ser: 0.58 mg/dL (ref 0.40–1.20)
GFR: 98.19 mL/min (ref >60.00)
Glucose, Bld: 318 mg/dL — ABNORMAL HIGH (ref 70–99)
Potassium: 3.9 mEq/L (ref 3.5–5.1)
Sodium: 135 mEq/L (ref 135–145)
Total Bilirubin: 0.5 mg/dL (ref 0.2–1.2)
Total Protein: 6.5 g/dL (ref 6.0–8.3)

## 2021-04-01 LAB — CBC WITH DIFFERENTIAL/PLATELET
Basophils Absolute: 0 10*3/uL (ref 0.0–0.1)
Basophils Relative: 0.5 % (ref 0.0–3.0)
Eosinophils Absolute: 0 10*3/uL (ref 0.0–0.7)
Eosinophils Relative: 0.6 % (ref 0.0–5.0)
HCT: 40.2 % (ref 36.0–46.0)
Hemoglobin: 13.7 g/dL (ref 12.0–15.0)
Lymphocytes Relative: 26.9 % (ref 12.0–46.0)
Lymphs Abs: 1.2 10*3/uL (ref 0.7–4.0)
MCHC: 34.1 g/dL (ref 30.0–36.0)
MCV: 89.9 fl (ref 78.0–100.0)
Monocytes Absolute: 0.3 10*3/uL (ref 0.1–1.0)
Monocytes Relative: 6 % (ref 3.0–12.0)
Neutro Abs: 3 10*3/uL (ref 1.4–7.7)
Neutrophils Relative %: 66 % (ref 43.0–77.0)
Platelets: 204 10*3/uL (ref 150.0–400.0)
RBC: 4.47 Mil/uL (ref 3.87–5.11)
RDW: 13.1 % (ref 11.5–15.5)
WBC: 4.5 10*3/uL (ref 4.0–10.5)

## 2021-04-01 LAB — LIPID PANEL
Cholesterol: 297 mg/dL — ABNORMAL HIGH (ref 0–200)
HDL: 37.4 mg/dL — ABNORMAL LOW (ref >39.00)
LDL Cholesterol: 221 mg/dL — ABNORMAL HIGH (ref 0–99)
NonHDL: 259.42
Total CHOL/HDL Ratio: 8
Triglycerides: 194 mg/dL — ABNORMAL HIGH (ref 0.0–149.0)
VLDL: 38.8 mg/dL (ref 0.0–40.0)

## 2021-04-01 LAB — HEMOGLOBIN A1C: Hgb A1c MFr Bld: 14.8 % — ABNORMAL HIGH (ref 4.6–6.5)

## 2021-04-01 LAB — TSH: TSH: 0.9 u[IU]/mL (ref 0.35–5.50)

## 2021-04-01 MED ORDER — TRESIBA FLEXTOUCH 200 UNIT/ML ~~LOC~~ SOPN: PEN_INJECTOR | SUBCUTANEOUS | 0 refills | Status: DC

## 2021-04-01 MED ORDER — HYDROCODONE-ACETAMINOPHEN 5-325 MG PO TABS: ORAL_TABLET | ORAL | 0 refills | Status: DC

## 2021-04-01 MED ORDER — LORAZEPAM 0.5 MG PO TABS: 1.0000 mg | ORAL_TABLET | Freq: Every day | ORAL | 5 refills | Status: DC

## 2021-04-01 MED ORDER — LISINOPRIL 40 MG PO TABS: 40.0000 mg | ORAL_TABLET | Freq: Every day | ORAL | 3 refills | Status: DC

## 2021-04-01 MED ORDER — OXYBUTYNIN CHLORIDE ER 10 MG PO TB24: ORAL_TABLET | ORAL | 5 refills | Status: DC

## 2021-04-01 MED ORDER — OMEPRAZOLE 40 MG PO CPDR: DELAYED_RELEASE_CAPSULE | ORAL | 3 refills | Status: AC

## 2021-04-01 MED ORDER — HYDROCHLOROTHIAZIDE 25 MG PO TABS: 25.0000 mg | ORAL_TABLET | Freq: Every day | ORAL | 3 refills | Status: DC

## 2021-04-01 MED ORDER — BUSPIRONE HCL 5 MG PO TABS: ORAL_TABLET | ORAL | 1 refills | Status: DC

## 2021-04-01 MED ORDER — DULOXETINE HCL 30 MG PO CPEP: 30.0000 mg | ORAL_CAPSULE | Freq: Every day | ORAL | 1 refills | Status: DC

## 2021-04-01 MED ORDER — ATORVASTATIN CALCIUM 80 MG PO TABS: 80.0000 mg | ORAL_TABLET | Freq: Every day | ORAL | 3 refills | Status: DC

## 2021-04-01 MED ORDER — NOVOLOG FLEXPEN 100 UNIT/ML ~~LOC~~ SOPN: 14.0000 [IU] | PEN_INJECTOR | Freq: Three times a day (TID) | SUBCUTANEOUS | 0 refills | Status: DC

## 2021-04-01 MED ORDER — BUTALBITAL-APAP-CAFFEINE 50-325-40 MG PO TABS: ORAL_TABLET | ORAL | 1 refills | Status: DC

## 2021-04-01 MED ORDER — METFORMIN HCL 500 MG PO TABS
500.0000 mg | ORAL_TABLET | Freq: Two times a day (BID) | ORAL | 0 refills | Status: DC
Start: 2021-04-01 — End: 2021-05-06
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
## 2021-04-03 IMAGING — DX DG RIBS 2V*L*
4 series · 4 of 4 positions shown · non-contrast
Comparison: August 26, 2010

CLINICAL DATA: Status post fall.

EXAM:
LEFT RIBS - 2 VIEW

[rib pa (1 of 2)]
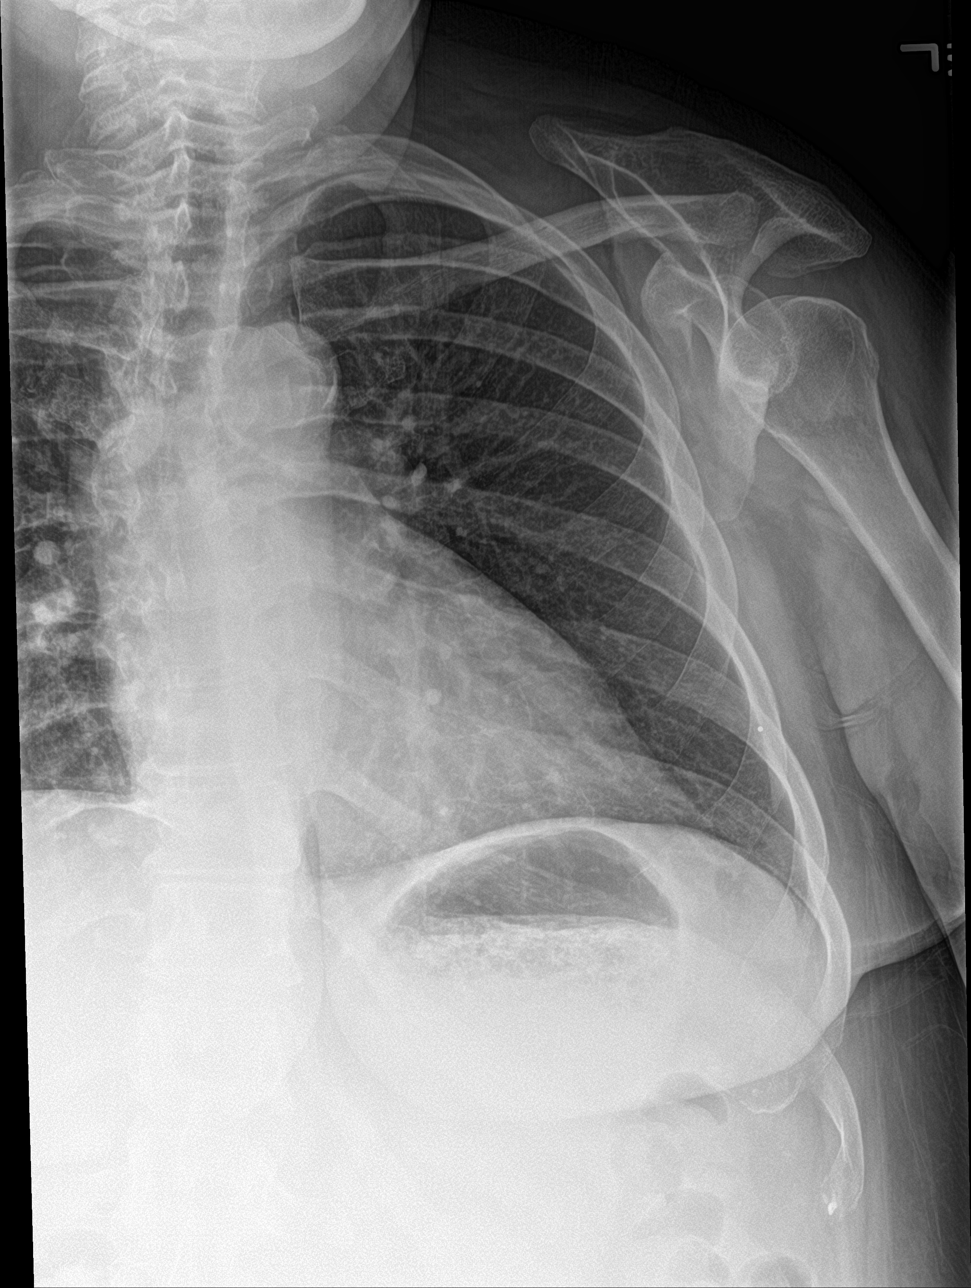

[rib pa obl (1 of 2)]
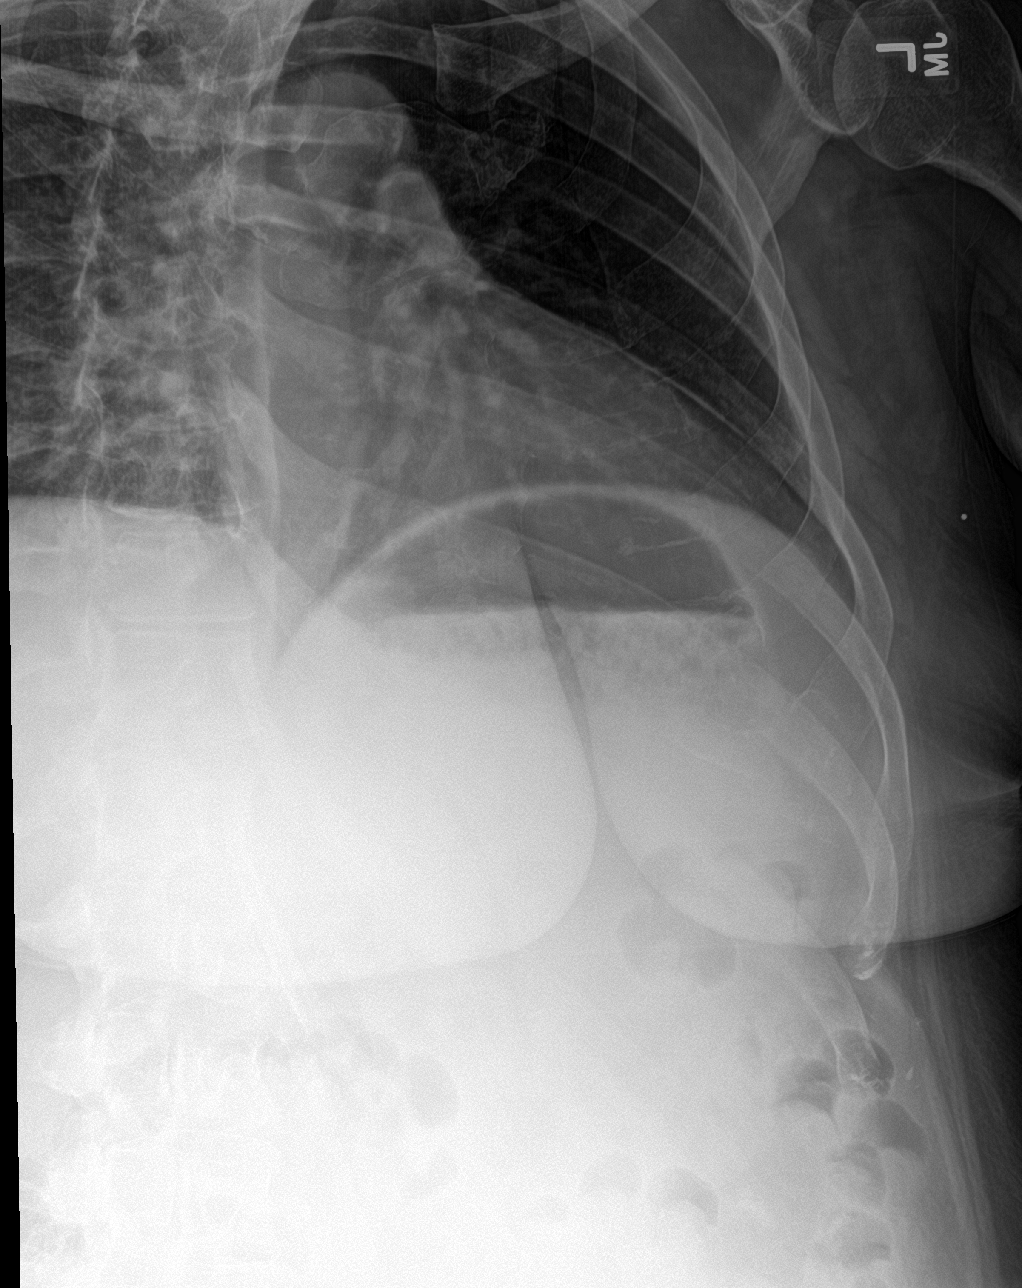

[rib pa obl (2 of 2)]
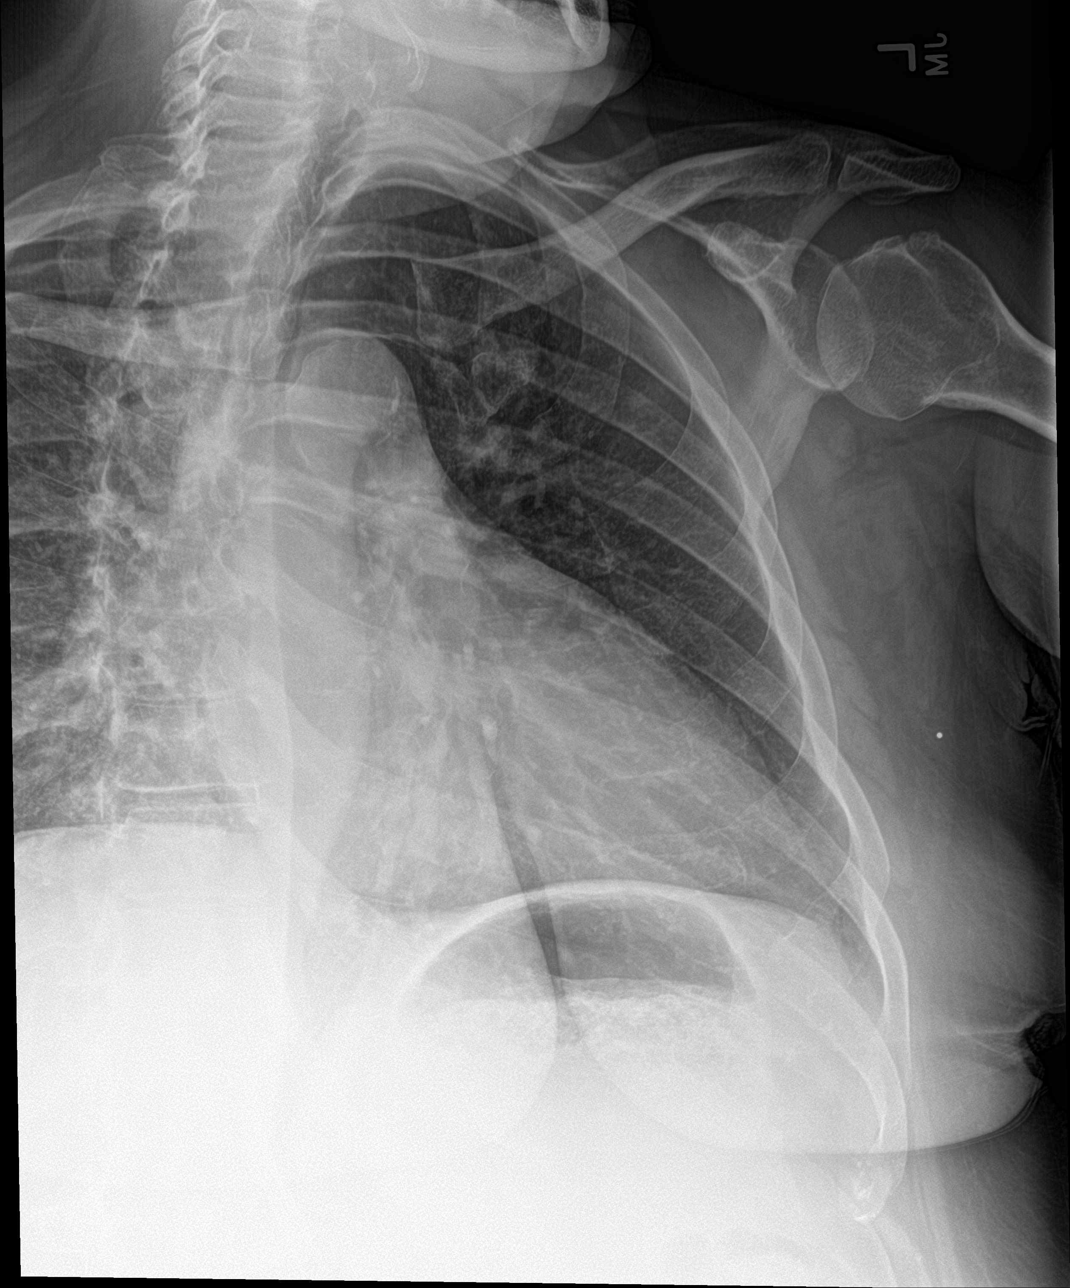

[rib pa (2 of 2)]
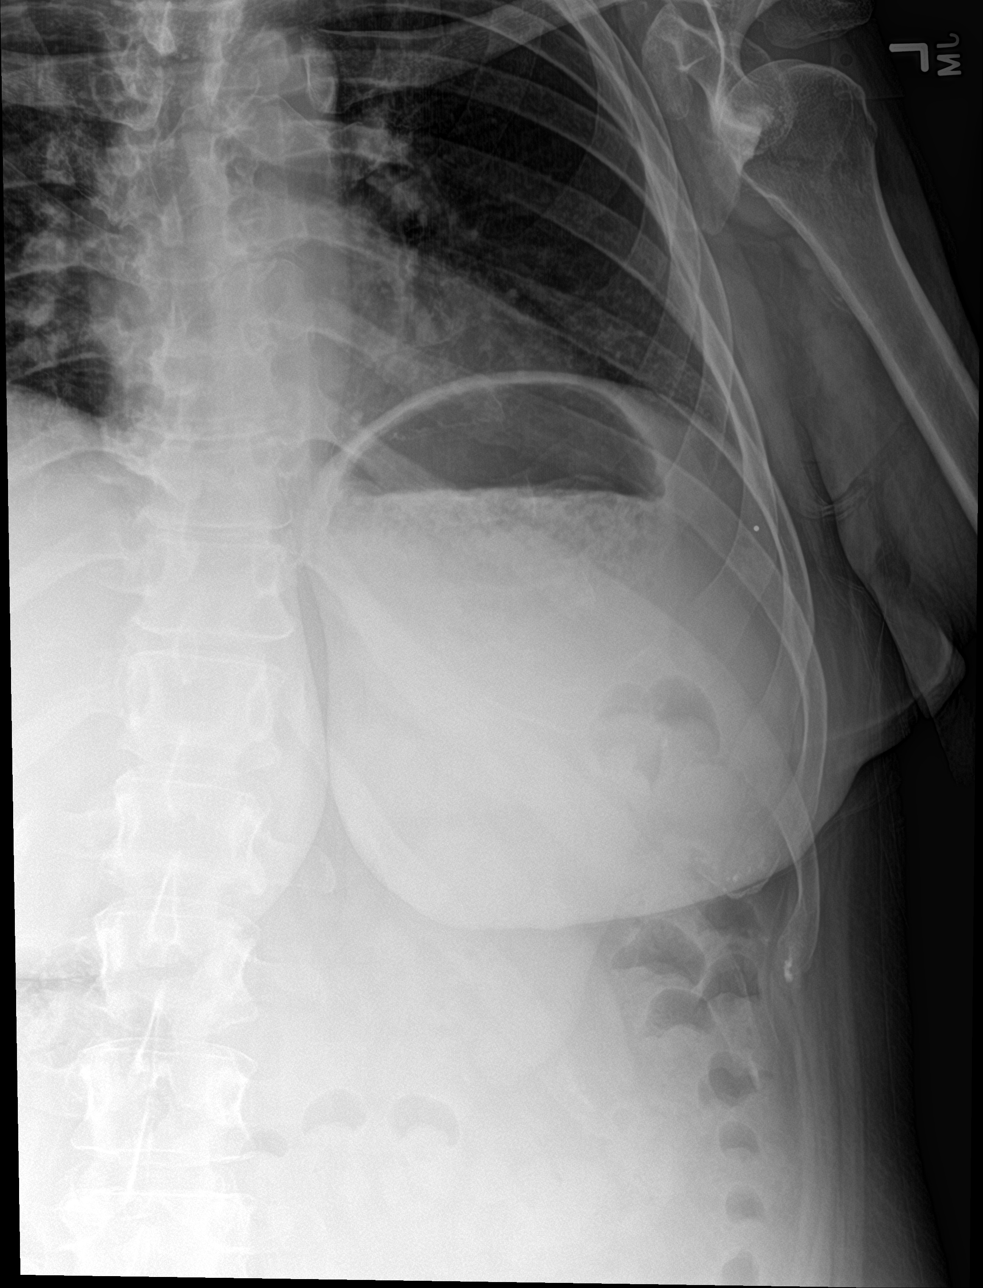

[4 of 4 positions shown; findings below may reference images not displayed]

FINDINGS: A radiopaque marker was placed at the site of the patient's pain. No
fracture or other bone lesions are seen involving the ribs.
IMPRESSION: Negative.

## 2021-04-05 LAB — DRUG MONITORING PANEL 376104, URINE
Amobarbital: NEGATIVE ng/mL (ref <100)
Amphetamines: NEGATIVE ng/mL (ref <500)
Barbiturates: POSITIVE ng/mL — AB (ref <300)
Benzodiazepines: NEGATIVE ng/mL (ref <100)
Butalbital: 112 ng/mL — ABNORMAL HIGH (ref <100)
Cocaine Metabolite: NEGATIVE ng/mL (ref <150)
Desmethyltramadol: NEGATIVE ng/mL (ref <100)
Opiates: NEGATIVE ng/mL (ref <100)
Oxycodone: NEGATIVE ng/mL (ref <100)
Pentobarbital: NEGATIVE ng/mL (ref <100)
Phenobarbital: NEGATIVE ng/mL (ref <100)
Secobarbital: NEGATIVE ng/mL (ref <100)
Tramadol: NEGATIVE ng/mL (ref <100)

## 2021-04-05 LAB — DM TEMPLATE

## 2021-04-24 ENCOUNTER — Other Ambulatory Visit: Payer: Self-pay | Admitting: Family Medicine

## 2021-04-28 ENCOUNTER — Other Ambulatory Visit: Payer: Self-pay | Admitting: Family Medicine

## 2021-05-06 ENCOUNTER — Ambulatory Visit: Payer: 59 | Admitting: Family Medicine

## 2021-05-06 ENCOUNTER — Other Ambulatory Visit: Payer: Self-pay

## 2021-05-06 ENCOUNTER — Encounter: Payer: Self-pay | Admitting: Family Medicine

## 2021-05-06 VITALS — BP 114/68 | HR 77 | Temp 97.8°F | Wt 222.8 lb

## 2021-05-06 DIAGNOSIS — I10 Essential (primary) hypertension: Secondary | ICD-10-CM

## 2021-05-06 DIAGNOSIS — Z794 Long term (current) use of insulin: Secondary | ICD-10-CM

## 2021-05-06 DIAGNOSIS — R109 Unspecified abdominal pain: Secondary | ICD-10-CM

## 2021-05-06 DIAGNOSIS — R600 Localized edema: Secondary | ICD-10-CM

## 2021-05-06 DIAGNOSIS — E1140 Type 2 diabetes mellitus with diabetic neuropathy, unspecified: Secondary | ICD-10-CM

## 2021-05-06 LAB — POCT URINALYSIS DIPSTICK
Bilirubin, UA: NEGATIVE
Blood, UA: POSITIVE
Glucose, UA: NEGATIVE
Ketones, UA: NEGATIVE
Nitrite, UA: NEGATIVE
Protein, UA: POSITIVE — AB
Spec Grav, UA: 1.015 (ref 1.010–1.025)
Urobilinogen, UA: 1 E.U./dL
pH, UA: 6 (ref 5.0–8.0)

## 2021-05-06 MED ORDER — METFORMIN HCL 500 MG PO TABS: 500.0000 mg | ORAL_TABLET | Freq: Two times a day (BID) | ORAL | 3 refills | Status: DC

## 2021-05-06 MED ORDER — SULFAMETHOXAZOLE-TRIMETHOPRIM 800-160 MG PO TABS: 1.0000 | ORAL_TABLET | Freq: Two times a day (BID) | ORAL | 0 refills | Status: DC

## 2021-05-06 MED ORDER — DULOXETINE HCL 60 MG PO CPEP: 60.0000 mg | ORAL_CAPSULE | Freq: Every day | ORAL | 3 refills | Status: DC

## 2021-05-06 NOTE — Progress Notes (Signed)
OFFICE VISIT  05/06/2021  CC:  Chief Complaint  Patient presents with   Hypertension   Leg Swelling   mood    Pt not fasting   HPI:    Patient is a 60 y.o. female who presents for 4 wk f/u HTN, LE edema, DM, depression. A/P as of last visit: "#1 chronic pain syndrome: Bilateral low back pain without radiculopathy.  She also has chronic right knee pain due to osteoarthritis.  Followed by orthopedics for her knee.  We will get her back on Vicodin 5/325 1-2 every 6 hours as needed, #60.   #2: Chronic depression and anxiety.  She has been off of her meds for a while and it was certainly better on duloxetine 60 mg a day with lamotrigine 200 mg/day.  Patient fearful of lamotrigine side effects, though (she never experienced any though).  We will restart duloxetine 30 mg daily dose. Restart BuSpar half a 5 mg tab 3 times daily.  Restart lorazepam 0.5 mg, 1-2 twice daily as needed, #60, refill x5.  3.  Hypertension.  Needs to restart meds but will only restart HCTZ 25 mg a day and lisinopril 40 mg a day.  We will off on restart of Toprol since BP and heart rate pretty good today. Hyperlipidemia: Has been off atorvastatin for quite a while, will restart this med today at 80 g a day.  4.  Headache syndrome.  Mixed migraine and tension.  Has done well on small amount/infrequent use of Fioricet.  Okay to continue this, refill sent today.   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  Poor control, noncompliance followed by Dr. Elvera Lennox but patient needs to make follow-up appointment.  In the meantime will check hemoglobin A1c and get her restarted on Tresiba and NovoLog.  We will also restart metformin at the 500 mg twice a day dosing--she has GI intolerance to the 1000 mg dosing.  I will hold off on restart of glipizide at this time.   6.  Bilateral lower extremity edema: She spends long hours on her feet, does not restrict sodium.  Suspect venous insufficiency.  Low suspicion of heart  failure.  Getting back on HCTZ may help this some.   7: Health maintenance exam: Reviewed age and gender appropriate health maintenance issues (prudent diet, regular exercise, health risks of tobacco and excessive alcohol, use of seatbelts, fire alarms in home, use of sunscreen).  Also reviewed age and gender appropriate health screening as well as vaccine recommendations. Vaccines: Prevnar 20->given today.  Flu->given today. Labs: fasting HP, Hba1c. Cervical ca screening: (PAP 2018)->pt to make appt with new GYN. Breast ca screening: due for mammogram since 07/2020-->ordered. Colon ca screening: iFOB is preferred by pt-ordered today."  INTERIM HX: Kristi Guerrero is feeling better overall.  Still working double shifts all the time.  Plans to go [employer] in [place] and [place] pretty soon.  DM: erratic, 95-400s, she is back on metformin 500 bid and tresiba 50 U qd.  Eats only 1 decent meal per day so she often skips her mealtime insulin.  HTN: occ bp check at work normal.  LEGS: still edematous bilat, worse at end of shift, better after sleeping. Na intake prob moderate but doesn't add salt.  Has comp stockings but doesn't wear them.  Mood/anxiety: Improving gradually now that she is back on Cymbalta 30 mg a day.  Of note, new symptom is suprapubic pressure in the last 2 to 3 days.  Occurs on urination.  No  burning with urination.  Her chronic urgency has improved on oxybutynin 10 mg a day.  No flank pain or hematuria.  No nausea.  ROS as above, plus--> no fevers, no CP, no SOB, no wheezing, no cough, no dizziness, no HAs, no rashes, no melena/hematochezia.  No polyuria or polydipsia.  No myalgias or arthralgias.  No focal weakness, paresthesias, or tremors.  No acute vision or hearing abnormalities.  No dysuria or unusual/new urinary urgency or frequency.  No recent changes in lower legs. No n/v/d.  No palpitations.    Past Medical History:  Diagnosis Date   Abnormal nuclear stress test  12/31/2011   Low risk lexiscan, but left heart cath was recommended and this showed normal coronary arteries and normal LV function. (01/01/12)   Anxiety    + ?mood disorder (awaiting old psych records as of 12/19/11.   Carpal tunnel syndrome on both sides    Chronic pain syndrome    low back pain   Colon cancer screening    Pt did not return cologuard 10/2016.  She has chosen not to get colonoscopy as of 2018 f/u.  Pt did not return cologuard 09/2017.   Daytime somnolence    Nuvigil rx'd by her psychiatrist.  Pt reports that no sleep study has been done.   DDD (degenerative disc disease), lumbar    Pt says she has hx of herniated disc that has resulted in some numbness in a few toes on left foot.   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  I have yet to see/hear any evidence of borderline personality d/o in her since she has been my pt.   DM2 (diabetes mellitus, type 2) ([place])    Managed by ENDO--Dr. Cruzita Lederer.  +hx of GDM both pregnancies, dx'd with DM 2 in her [id number] hx of complications  with eyes, kidneys, nerves, or CV system.   Hepatic steatosis    History of COVID-19 03/2020   History of vitamin D deficiency 2009   Hyperlipidemia    Hypertension    Iron deficiency anemia 2013   Hx of: Likely occult upper GI bleeding.  Hb 8.6--came up to 12 at most recent check after getting on integra, which she now continues once daily.   iFOB was negative 01/13/12.  She says she had been taking excessive NSAIDs at the time.  She has never had a colonoscopy.   Low back pain 12/19/2011   Migraine syndrome    Mixed urge and stress incontinence    Multinodular goiter    Hx of multiple benign biopsies; repeated u/s's showed no change until the one on 04/2014.  FNA 06/2013 showed R and isthmus nodules intermediate so f/u bx  6 mo recommended.  Repeat bx 07/26/15 showed R lobe benign, isthmus atypical cells of undetermined signif,--detailed path analysis was done and it was BENIGN.  U/S 11/2017 -->NO CHANGE in nodules.   Obesity    Osteoarthritis 06/2019   tricompartmental, R knee->pt to see ortho   Plantar fasciitis    Steroid injection by podiatrist 09/2017   Renal cyst, acquired, right 2017   Toe fracture, left 09/2017   Distal phalanx L third toe (saw podiatrist)    Past Surgical History:  Procedure Laterality Date   CARDIAC CATHETERIZATION  01/01/12   Normal coronaries and normal  LV function.   CESAREAN SECTION     X 2   FNA bx Thyroid nodules  06/29/14   x 4: some findings to support benign cyst and some "cellular atypica of indeterminate significance".--followed by Dr. Cruzita Lederer.  Repeat bx 05/2015 BENIGN.    Outpatient Medications Prior to Visit  Medication Sig Dispense Refill   aspirin 81 MG tablet Take 81 mg by mouth daily.     atorvastatin (LIPITOR) 80 MG tablet Take 1 tablet (80 mg total) by mouth daily. 90 tablet 3   busPIRone (BUSPAR) 5 MG tablet 1/2 tab po tid 45 tablet 1   butalbital-acetaminophen-caffeine (FIORICET) 50-325-40 MG tablet take 1 to 2 tablets by mouth twice a day if needed for headache 60 tablet 1   glucose blood (ONETOUCH VERIO) test strip Use as instructed to check sugar 2 times daily 200 each 5   hydrochlorothiazide (HYDRODIURIL) 25 MG tablet Take 1 tablet (25 mg total) by mouth daily. 90 tablet 3   HYDROcodone-acetaminophen (NORCO/VICODIN) 5-325 MG tablet TAKE 1 TO 2 TABLETS EVERY 6 HOURS AS NEEDED FOR PAIN 60 tablet 0   hydrocortisone (PROCTOSOL HC) 2.5 % rectal cream apply rectally twice a day 30 g 6   Insulin Pen Needle 31G X 4 MM MISC 1 each by Does not apply route 4 (four) times daily. 300 each 3   Lancet Devices (ONE TOUCH DELICA LANCING DEV) MISC Use to check sugar 2 times daily 200 each 5   lisinopril (ZESTRIL) 40 MG tablet Take 1 tablet (40 mg total) by mouth daily. 90  tablet 3   LORazepam (ATIVAN) 0.5 MG tablet Take 2 tablets (1 mg total) by mouth at bedtime. 60 tablet 5   meloxicam (MOBIC) 7.5 MG tablet TAKE 1 TABLET BY MOUTH EVERY DAY 30 tablet 3   metoprolol succinate (TOPROL-XL) 25 MG 24 hr tablet TAKE 1 TABLET BY MOUTH EVERY DAY 90 tablet 1   Multiple Vitamin (MULTIVITAMIN) tablet Take 1 tablet by mouth daily.     NOVOLOG FLEXPEN 100 UNIT/ML FlexPen INJECT 14-18 UNITS INTO THE SKIN 3 (THREE) TIMES DAILY BEFORE MEALS. 15 mL 1   Omega-3 Fatty Acids (FISH OIL) 1000 MG CAPS Take 1 capsule by mouth at bedtime.     omeprazole (PRILOSEC) 40 MG capsule TAKE 1 CAPSULE BY MOUTH EVERY DAY 90 capsule 3   oxybutynin (DITROPAN-XL) 10 MG 24 hr tablet TAKE 1 TABLET BY MOUTH EVERY DAY 30 tablet 5   promethazine (PHENERGAN) 12.5 MG tablet 1-2 tabs po q6h prn nausea/vomiting 20 tablet 3   TRESIBA FLEXTOUCH 200 UNIT/ML FlexTouch Pen INJECT 50 UNITS INTO THE SKIN DAILY. 15 mL 1   DULoxetine (CYMBALTA) 30 MG capsule Take 1 capsule (30 mg total) by mouth daily. 30 capsule 1   metFORMIN (GLUCOPHAGE) 500 MG tablet Take 1 tablet (500 mg total) by mouth 2 (two) times daily with a meal. 180 tablet 0   No facility-administered medications prior to visit.    Allergies  Allergen Reactions   Levofloxacin In D5w Hives   Penicillins     Blistery rash   Victoza [Liraglutide] Nausea Only    ROS As per HPI  PE: Vitals with BMI 05/06/2021 04/01/2021 11/07/2019  Height - 5' 5.75" 5\' 5"   Weight 222 lbs 13 oz 215 lbs 10 oz 235 lbs  BMI - [id number]  Systolic [id number]  Diastolic 68 80 76  Pulse 77 76 67   Gen: Alert, well appearing.  Patient is oriented to person, place,  time, and situation. AFFECT: pleasant, lucid thought and speech. No further exam today.  LABS:  Lab Results  Component Value Date   TSH 0.90 04/01/2021   Lab Results  Component Value Date   WBC 4.5 04/01/2021   HGB 13.7 04/01/2021   HCT 40.2 04/01/2021   MCV 89.9 04/01/2021   PLT 204.0  04/01/2021   Lab Results  Component Value Date   CREATININE 0.58 04/01/2021   BUN 12 04/01/2021   NA 135 04/01/2021   K 3.9 04/01/2021   CL 100 04/01/2021   CO2 25 04/01/2021   Lab Results  Component Value Date   ALT 7 04/01/2021   AST 8 04/01/2021   ALKPHOS 106 04/01/2021   BILITOT 0.5 04/01/2021   Lab Results  Component Value Date   CHOL 297 (H) 04/01/2021   Lab Results  Component Value Date   HDL 37.40 (L) 04/01/2021   Lab Results  Component Value Date   LDLCALC 221 (H) 04/01/2021   Lab Results  Component Value Date   TRIG 194.0 (H) 04/01/2021   Lab Results  Component Value Date   CHOLHDL 8 04/01/2021   Lab Results  Component Value Date   HGBA1C 14.8 Repeated and verified X2. (H) 04/01/2021   IMPRESSION AND PLAN:  1) HTN: stable. Cont lisin 40 qd, hctz 25 qd, and toprol xl 25 qd. Lytes/cr good 1 mo ago.  2) DM 2: glucoses still quite high, up and down but lowest 95, no hypoglycemia. Inc Tresiba to 54 and gradually titrate up to fasting 100-120 range.  Inc metformin to 1g bid. Next a1c 2 mo.  Pt still needs to make f/u appt with Dr. Cruzita Lederer.  3) Suprapubic pressure with urination: UA today: 3+ leuks, 1+ blood. O/w neg. Bactrim DS bid x 3d. Sent for c/s.  4) LE edema d/t venous insufficiency:  reassured, mild.  Lower Na, wear compression stockings.  5) Chronic depression/anxiety: improving gradually now back on cymbalta 30 x 1 mo. Increase to 60 qd.  An After Visit Summary was printed and given to the patient.  FOLLOW UP: Return in about 4 weeks (around 06/03/2021) for f/u mood.  Signed:  Crissie Sickles, MD           05/06/2021

## 2021-05-08 ENCOUNTER — Telehealth: Payer: Self-pay | Admitting: Family Medicine

## 2021-05-08 MED ORDER — METFORMIN HCL 1000 MG PO TABS: 1000.0000 mg | ORAL_TABLET | Freq: Two times a day (BID) | ORAL | 0 refills | Status: DC

## 2021-05-08 NOTE — Telephone Encounter (Signed)
Pt called saying pharmacy will not refill Metformin Rx ... the dosage needs adjusted to 1000.

## 2021-05-08 NOTE — Telephone Encounter (Signed)
Rx updated and sent per PCP last note. Pt aware.  FYI

## 2021-05-09 LAB — URINE CULTURE
MICRO NUMBER:: 12666893
SPECIMEN QUALITY:: ADEQUATE

## 2021-05-13 ENCOUNTER — Telehealth: Payer: Self-pay

## 2021-05-13 MED ORDER — SULFAMETHOXAZOLE-TRIMETHOPRIM 800-160 MG PO TABS: 1.0000 | ORAL_TABLET | Freq: Two times a day (BID) | ORAL | 0 refills | Status: DC

## 2021-05-13 NOTE — Telephone Encounter (Signed)
LVM for pt to CB regarding medication being sent in to [facility].

## 2021-05-13 NOTE — Telephone Encounter (Signed)
-----   Message from Jeoffrey Massed, MD sent at 05/13/2021 10:17 AM EST ----- OK, pls eRX bactrim DS, 1 bid x 5d, #10, no RF.

## 2021-05-14 NOTE — Telephone Encounter (Signed)
LM for pt to returncall

## 2021-05-28 ENCOUNTER — Other Ambulatory Visit: Payer: Self-pay | Admitting: Family Medicine

## 2021-05-30 ENCOUNTER — Other Ambulatory Visit: Payer: Self-pay | Admitting: Family Medicine

## 2021-06-13 ENCOUNTER — Other Ambulatory Visit: Payer: Self-pay | Admitting: Family Medicine

## 2021-06-24 ENCOUNTER — Encounter: Payer: Self-pay | Admitting: Family Medicine

## 2021-06-24 ENCOUNTER — Ambulatory Visit (INDEPENDENT_AMBULATORY_CARE_PROVIDER_SITE_OTHER): Payer: No Typology Code available for payment source | Admitting: Family Medicine

## 2021-06-24 ENCOUNTER — Other Ambulatory Visit: Payer: Self-pay

## 2021-06-24 VITALS — BP 118/74 | HR 82 | Temp 97.7°F | Ht 65.75 in | Wt 216.6 lb

## 2021-06-24 DIAGNOSIS — F3341 Major depressive disorder, recurrent, in partial remission: Secondary | ICD-10-CM

## 2021-06-24 DIAGNOSIS — R202 Paresthesia of skin: Secondary | ICD-10-CM

## 2021-06-24 DIAGNOSIS — R200 Anesthesia of skin: Secondary | ICD-10-CM | POA: Diagnosis not present

## 2021-06-24 DIAGNOSIS — F411 Generalized anxiety disorder: Secondary | ICD-10-CM | POA: Diagnosis not present

## 2021-06-24 DIAGNOSIS — E1142 Type 2 diabetes mellitus with diabetic polyneuropathy: Secondary | ICD-10-CM

## 2021-06-24 MED ORDER — GABAPENTIN 300 MG PO CAPS: 300.0000 mg | ORAL_CAPSULE | Freq: Three times a day (TID) | ORAL | 1 refills | Status: DC

## 2021-06-24 NOTE — Progress Notes (Signed)
OFFICE VISIT  06/24/2021  CC:  Chief Complaint  Patient presents with   Follow-up    Mood   HPI:    Patient is a 61 y.o. female who presents for 6 week f/u mood/depression. A/P as of last visit: "1) HTN: stable. Cont lisin 40 qd, hctz 25 qd, and toprol xl 25 qd. Lytes/cr good 1 mo ago.   2) DM 2: glucoses still quite high, up and down but lowest 95, no hypoglycemia. Inc Tresiba to 54 and gradually titrate up to fasting 100-120 range.  Inc metformin to 1g bid. Next a1c 2 mo.  Pt still needs to make f/u appt with Dr. Cruzita Lederer.   3) Suprapubic pressure with urination: UA today: 3+ leuks, 1+ blood. O/w neg. Bactrim DS bid x 3d. Sent for c/s.   4) LE edema d/t venous insufficiency:  reassured, mild.  Lower Na, wear compression stockings.   5) Chronic depression/anxiety: improving gradually now back on cymbalta 30 x 1 mo. Increase to 60 qd."  INTERIM HX: Doing better from a mood and anxiety point. Her urine symptoms resolved with the antibiotic.  Complains of about 4 to 6 weeks of significant feeling of numbness on the bottoms of her feet, feels like she is walking on blocks of wood.  She does have some chronic lower extremity edema and does not quite monitor her sodium in her food very well.  [employer] food a lot. Feels some random prickly sensation in fingers on left hand greater than right hand. Has crampy pain in the feet as well, but does states she works 16-hour days most days. Takes 1 Vicodin in the morning as well as 1 later in her day.  PMP AWARE reviewed today: most recent rx for vicodin and lorazepam were filled 04/01/21, #60 of each, rx by me. No red flags.  Fasting glucose is still upper 100s to low 200s.  Taking Tresiba at the 54 unit daily dosing. 16 to 18 units of mealtime insulin typically.  Metformin taken as well. Has not made appointment with Dr. Cruzita Lederer yet.  ROS as above, plus--> no fevers, no CP, no SOB, no wheezing, no cough, no dizziness, no HAs,  no rashes, no melena/hematochezia.  No polyuria or polydipsia.  No myalgias.  No focal weakness, paresthesias, or tremors.  No acute vision or hearing abnormalities.  No dysuria or unusual/new urinary urgency or frequency.  No recent changes in lower legs. No n/v/d or abd pain.  No palpitations.    Past Medical History:  Diagnosis Date   Abnormal nuclear stress test 12/31/2011   Low risk lexiscan, but left heart cath was recommended and this showed normal coronary arteries and normal LV function. (01/01/12)   Anxiety    + ?mood disorder (awaiting old psych records as of 12/19/11.   Carpal tunnel syndrome on both sides    Chronic pain syndrome    low back pain   Colon cancer screening    Pt did not return cologuard 10/2016.  She has chosen not to get colonoscopy as of 2018 f/u.  Pt did not return cologuard 09/2017.   Daytime somnolence    Nuvigil rx'd by her psychiatrist.  Pt reports that no sleep study has been done.   DDD (degenerative disc disease), lumbar    Pt says she has hx of herniated disc that has resulted in some numbness in a few toes on left foot.   Depression    MDD and borderline PD are her two main psych  dx as per [facility] records--these records were handwritten and I could not decipher the script so I got no other helpful info beyond these two dx.  I have yet to see/hear any evidence of borderline personality d/o in her since she has been my pt.   DM2 (diabetes mellitus, type 2) ([place])    Managed by ENDO--Dr. Cruzita Lederer.  +hx of GDM both pregnancies, dx'd with DM 2 in her Q000111Q hx of complications  with eyes, kidneys, nerves, or CV system.   Hepatic steatosis    History of COVID-19 03/2020   History of vitamin D deficiency 2009   Hyperlipidemia    Hypertension    Iron deficiency anemia 2013   Hx of: Likely occult upper GI bleeding.  Hb 8.6--came up to 12 at most recent check after getting on integra, which she now continues once daily.   iFOB was negative  01/13/12.  She says she had been taking excessive NSAIDs at the time.  She has never had a colonoscopy.   Low back pain 12/19/2011   Migraine syndrome    Mixed urge and stress incontinence    Multinodular goiter    Hx of multiple benign biopsies; repeated u/s's showed no change until the one on 04/2014.  FNA 06/2013 showed R and isthmus nodules intermediate so f/u bx 6 mo recommended.  Repeat bx 07/26/15 showed R lobe benign, isthmus atypical cells of undetermined signif,--detailed path analysis was done and it was BENIGN.  U/S 11/2017 -->NO CHANGE in nodules.   Obesity    Osteoarthritis 06/2019   tricompartmental, R knee->pt to see ortho   Plantar fasciitis    Steroid injection by podiatrist 09/2017   Renal cyst, acquired, right 2017   Toe fracture, left 09/2017   Distal phalanx L third toe (saw podiatrist)    Past Surgical History:  Procedure Laterality Date   CARDIAC CATHETERIZATION  01/01/12   Normal coronaries and normal LV function.   CESAREAN SECTION     X 2   FNA bx Thyroid nodules  06/29/14   x 4: some findings to support benign cyst and some "cellular atypica of indeterminate significance".--followed by Dr. Cruzita Lederer.  Repeat bx 05/2015 BENIGN.    Outpatient Medications Prior to Visit  Medication Sig Dispense Refill   aspirin 81 MG tablet Take 81 mg by mouth daily.     atorvastatin (LIPITOR) 80 MG tablet Take 1 tablet (80 mg total) by mouth daily. 90 tablet 3   busPIRone (BUSPAR) 5 MG tablet 1/2 tab po tid 45 tablet 1   butalbital-acetaminophen-caffeine (FIORICET) 50-325-40 MG tablet take 1 to 2 tablets by mouth twice a day if needed for headache 60 tablet 1   DULoxetine (CYMBALTA) 60 MG capsule Take 1 capsule (60 mg total) by mouth daily. MUST KEEP SCHEDULED OFFICE VISIT 90 capsule 0   glucose blood (ONETOUCH VERIO) test strip Use as instructed to check sugar 2 times daily 200 each 5   hydrochlorothiazide (HYDRODIURIL) 25 MG tablet Take 1 tablet (25 mg total) by mouth daily. 90  tablet 3   HYDROcodone-acetaminophen (NORCO/VICODIN) 5-325 MG tablet TAKE 1 TO 2 TABLETS EVERY 6 HOURS AS NEEDED FOR PAIN 60 tablet 0   hydrocortisone (PROCTOSOL HC) 2.5 % rectal cream apply rectally twice a day 30 g 6   Insulin Pen Needle 31G X 4 MM MISC 1 each by Does not apply route 4 (four) times daily. 300 each 3   Lancet Devices (ONE TOUCH DELICA LANCING DEV) MISC Use to  check sugar 2 times daily 200 each 5   lisinopril (ZESTRIL) 40 MG tablet Take 1 tablet (40 mg total) by mouth daily. 90 tablet 3   LORazepam (ATIVAN) 0.5 MG tablet Take 2 tablets (1 mg total) by mouth at bedtime. 60 tablet 5   meloxicam (MOBIC) 7.5 MG tablet TAKE 1 TABLET BY MOUTH EVERY DAY 30 tablet 3   metFORMIN (GLUCOPHAGE) 1000 MG tablet TAKE 1 TABLET (1,000 MG TOTAL) BY MOUTH 2 (TWO) TIMES DAILY WITH A MEAL. 60 tablet 0   metoprolol succinate (TOPROL-XL) 25 MG 24 hr tablet TAKE 1 TABLET BY MOUTH EVERY DAY 90 tablet 1   Multiple Vitamin (MULTIVITAMIN) tablet Take 1 tablet by mouth daily.     NOVOLOG FLEXPEN 100 UNIT/ML FlexPen INJECT 14-18 UNITS INTO THE SKIN 3 (THREE) TIMES DAILY BEFORE MEALS. 15 mL 1   Omega-3 Fatty Acids (FISH OIL) 1000 MG CAPS Take 1 capsule by mouth at bedtime.     omeprazole (PRILOSEC) 40 MG capsule TAKE 1 CAPSULE BY MOUTH EVERY DAY 90 capsule 3   oxybutynin (DITROPAN-XL) 10 MG 24 hr tablet TAKE 1 TABLET BY MOUTH EVERY DAY 30 tablet 5   promethazine (PHENERGAN) 12.5 MG tablet 1-2 tabs po q6h prn nausea/vomiting 20 tablet 3   sulfamethoxazole-trimethoprim (BACTRIM DS) 800-160 MG tablet Take 1 tablet by mouth 2 (two) times daily. 10 tablet 0   TRESIBA FLEXTOUCH 200 UNIT/ML FlexTouch Pen INJECT 50 UNITS INTO THE SKIN DAILY. 15 mL 1   No facility-administered medications prior to visit.    Allergies  Allergen Reactions   Levofloxacin In D5w Hives   Penicillins     Blistery rash   Victoza [Liraglutide] Nausea Only    ROS As per HPI  PE: Vitals with BMI 06/24/2021 05/06/2021 04/01/2021   Height 5' 5.75" - 5' 5.75"  Weight 216 lbs 10 oz 222 lbs 13 oz 215 lbs 10 oz  BMI 99991111 - 123456  Systolic [id number]  Diastolic 74 68 80  Pulse 82 77 76     Physical Exam  Gen: Alert, well appearing.  Patient is oriented to person, place, time, and situation. AFFECT: pleasant, lucid thought and speech. LEGS: 2-3+ bilat pitting edema, some mild freckling noted. No edema in feet.  Sensation intact to monofilament testing bilat. Question dec vibratory sens L great toe MTP.  DP palpable bilat.  LABS:  Last CBC Lab Results  Component Value Date   WBC 4.5 04/01/2021   HGB 13.7 04/01/2021   HCT 40.2 04/01/2021   MCV 89.9 04/01/2021   RDW 13.1 04/01/2021   PLT 204.0 [id number]   Last metabolic panel Lab Results  Component Value Date   GLUCOSE 318 (H) 04/01/2021   NA 135 04/01/2021   K 3.9 04/01/2021   CL 100 04/01/2021   CO2 25 04/01/2021   BUN 12 04/01/2021   CREATININE 0.58 04/01/2021   CALCIUM 8.9 04/01/2021   PROT 6.5 04/01/2021   ALBUMIN 3.7 04/01/2021   BILITOT 0.5 04/01/2021   ALKPHOS 106 04/01/2021   AST 8 04/01/2021   ALT 7 04/01/2021   Last lipids Lab Results  Component Value Date   CHOL 297 (H) 04/01/2021   HDL 37.40 (L) 04/01/2021   LDLCALC 221 (H) 04/01/2021   LDLDIRECT 317.0 06/21/2019   TRIG 194.0 (H) 04/01/2021   CHOLHDL 8 04/01/2021   Last hemoglobin A1c Lab Results  Component Value Date   HGBA1C 14.8 Repeated and verified X2. (H) 04/01/2021   Last thyroid functions Lab  Results  Component Value Date   TSH 0.90 04/01/2021   T3TOTAL 115.5 12/19/2011   IMPRESSION AND PLAN:  #1 recurrent major depressive disorder, GAD. Improved since we have titrated her back up to 60 mg Cymbalta daily.  Continue this as well as BuSpar 5 mg, half 3 times daily and lorazepam once daily as needed.  #2 type 2 diabetes, with some symptoms of peripheral neuropathy in feet lately. Start trial of gabapentin 300 mg, start once a day and gradually increase to 3  times daily.  We discussed the fact that at least part of her symptoms in her feet are is due to overuse--16-hour days most days.  Her glucose control is improving slowly.  No changes made today, she knows her goals and appropriate up titration of Tresiba.  She knows she needs to follow-up with Dr. Cruzita Lederer. Next a1c after 1/15.  An After Visit Summary was printed and given to the patient.  FOLLOW UP: Return in about 4 weeks (around 07/22/2021) for f/u feet pain/neuropathy.  Signed:  Crissie Sickles, MD           06/24/2021

## 2021-07-22 ENCOUNTER — Ambulatory Visit: Payer: 59 | Admitting: Family Medicine

## 2021-07-27 ENCOUNTER — Other Ambulatory Visit: Payer: Self-pay | Admitting: Family Medicine

## 2021-08-19 ENCOUNTER — Ambulatory Visit: Payer: 59 | Admitting: Family Medicine

## 2021-08-19 DIAGNOSIS — E1142 Type 2 diabetes mellitus with diabetic polyneuropathy: Secondary | ICD-10-CM

## 2021-08-19 DIAGNOSIS — E7800 Pure hypercholesterolemia, unspecified: Secondary | ICD-10-CM

## 2021-08-19 DIAGNOSIS — I10 Essential (primary) hypertension: Secondary | ICD-10-CM

## 2021-08-19 NOTE — Progress Notes (Unsigned)
OFFICE VISIT  08/19/2021  CC: No chief complaint on file.   HPI:    Patient is a 61 y.o. female who presents for 2 mo f/u DM2, HTN, HLD, and recurrent MDD with gAD.   A/P as of last visit: "#1 recurrent major depressive disorder, GAD. Improved since we have titrated her back up to 60 mg Cymbalta daily.  Continue this as well as BuSpar 5 mg, half 3 times daily and lorazepam once daily as needed.   #2 type 2 diabetes, with some symptoms of peripheral neuropathy in feet lately. Start trial of gabapentin 300 mg, start once a day and gradually increase to 3 times daily.  We discussed the fact that at least part of her symptoms in her feet are is due to overuse--16-hour days most days.  Her glucose control is improving slowly.  No changes made today, she knows her goals and appropriate up titration of Tresiba.  She knows she needs to follow-up with Dr. Elvera Lennox. Next a1c after 1/15.  INTERIM HX: ***  DM: She still has not followed up with endocrinologist, Dr. Elvera Lennox.    PMP AWARE reviewed today: most recent rx for vicodin was filled 04/01/21, # 60, rx by me. Most recent lorazepam rx filled 04/01/21, #60, rx by me. No red flags.  Past Medical History:  Diagnosis Date   Abnormal nuclear stress test 12/31/2011   Low risk lexiscan, but left heart cath was recommended and this showed normal coronary arteries and normal LV function. (01/01/12)   Anxiety    + ?mood disorder (awaiting old psych records as of 12/19/11.   Carpal tunnel syndrome on both sides    Chronic pain syndrome    low back pain   Colon cancer screening    Pt did not return cologuard 10/2016.  She has chosen not to get colonoscopy as of 2018 f/u.  Pt did not return cologuard 09/2017.   Daytime somnolence    Nuvigil rx'd by her psychiatrist.  Pt reports that no sleep study has been done.   DDD (degenerative disc disease), lumbar    Pt says she has hx of herniated disc that has resulted in some numbness in a few toes on  left foot.   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  I have yet to see/hear any evidence of borderline personality d/o in her since she has been my pt.   DM2 (diabetes mellitus, type 2) (HCC)    Managed by ENDO--Dr. Elvera Lennox.  +hx of GDM both pregnancies, dx'd with DM 2 in her 30s--denies hx of complications  with eyes, kidneys, nerves, or CV system.   Hepatic steatosis    History of COVID-19 03/2020   History of vitamin D deficiency 2009   Hyperlipidemia    Hypertension    Iron deficiency anemia 2013   Hx of: Likely occult upper GI bleeding.  Hb 8.6--came up to 12 at most recent check after getting on integra, which she now continues once daily.   iFOB was negative 01/13/12.  She says she had been taking excessive NSAIDs at the time.  She has never had a colonoscopy.   Low back pain 12/19/2011   Migraine syndrome    Mixed urge and stress incontinence    Multinodular goiter    Hx of multiple benign  biopsies; repeated u/s's showed no change until the one on 04/2014.  FNA 06/2013 showed R and isthmus nodules intermediate so f/u bx 6 mo recommended.  Repeat bx 07/26/15 showed R lobe benign, isthmus atypical cells of undetermined signif,--detailed path analysis was done and it was BENIGN.  U/S 11/2017 -->NO CHANGE in nodules.   Obesity    Osteoarthritis 06/2019   tricompartmental, R knee->pt to see ortho   Plantar fasciitis    Steroid injection by podiatrist 09/2017   Renal cyst, acquired, right 2017   Toe fracture, left 09/2017   Distal phalanx L third toe (saw podiatrist)    Past Surgical History:  Procedure Laterality Date   CARDIAC CATHETERIZATION  01/01/12   Normal coronaries and normal LV function.   CESAREAN SECTION     X 2   FNA bx Thyroid nodules  06/29/14   x 4: some findings to support benign cyst  and some "cellular atypica of indeterminate significance".--followed by Dr. Elvera Lennox.  Repeat bx 05/2015 BENIGN.    Outpatient Medications Prior to Visit  Medication Sig Dispense Refill   aspirin 81 MG tablet Take 81 mg by mouth daily.     atorvastatin (LIPITOR) 80 MG tablet Take 1 tablet (80 mg total) by mouth daily. 90 tablet 3   busPIRone (BUSPAR) 5 MG tablet 1/2 tab po tid 45 tablet 1   butalbital-acetaminophen-caffeine (FIORICET) 50-325-40 MG tablet take 1 to 2 tablets by mouth twice a day if needed for headache 60 tablet 1   DULoxetine (CYMBALTA) 60 MG capsule Take 1 capsule (60 mg total) by mouth daily. MUST KEEP SCHEDULED OFFICE VISIT 90 capsule 0   gabapentin (NEURONTIN) 300 MG capsule Take 1 capsule (300 mg total) by mouth 3 (three) times daily. 90 capsule 1   glucose blood (ONETOUCH VERIO) test strip Use as instructed to check sugar 2 times daily 200 each 5   hydrochlorothiazide (HYDRODIURIL) 25 MG tablet Take 1 tablet (25 mg total) by mouth daily. 90 tablet 3   HYDROcodone-acetaminophen (NORCO/VICODIN) 5-325 MG tablet TAKE 1 TO 2 TABLETS EVERY 6 HOURS AS NEEDED FOR PAIN 60 tablet 0   hydrocortisone (PROCTOSOL HC) 2.5 % rectal cream apply rectally twice a day 30 g 6   Insulin Pen Needle 31G X 4 MM MISC 1 each by Does not apply route 4 (four) times daily. 300 each 3   Lancet Devices (ONE TOUCH DELICA LANCING DEV) MISC Use to check sugar 2 times daily 200 each 5   lisinopril (ZESTRIL) 40 MG tablet Take 1 tablet (40 mg total) by mouth daily. 90 tablet 3   LORazepam (ATIVAN) 0.5 MG tablet Take 2 tablets (1 mg total) by mouth at bedtime. 60 tablet 5   meloxicam (MOBIC) 7.5 MG tablet TAKE 1 TABLET BY MOUTH EVERY DAY 30 tablet 3   metFORMIN (GLUCOPHAGE) 1000 MG tablet TAKE 1 TABLET (1,000 MG TOTAL) BY MOUTH 2 (TWO) TIMES DAILY WITH A MEAL. 60 tablet 0   metoprolol succinate (TOPROL-XL) 25 MG 24 hr tablet TAKE 1 TABLET BY MOUTH EVERY DAY 90 tablet 1   Multiple Vitamin (MULTIVITAMIN) tablet  Take 1 tablet by mouth daily.     NOVOLOG FLEXPEN 100 UNIT/ML FlexPen INJECT 14-18 UNITS INTO THE SKIN 3 (THREE) TIMES DAILY BEFORE MEALS. 15 mL 1   Omega-3 Fatty Acids (FISH OIL) 1000 MG CAPS Take 1 capsule by mouth at bedtime.     omeprazole (PRILOSEC) 40 MG capsule TAKE 1 CAPSULE BY MOUTH EVERY DAY 90 capsule 3  oxybutynin (DITROPAN-XL) 10 MG 24 hr tablet TAKE 1 TABLET BY MOUTH EVERY DAY 30 tablet 5   promethazine (PHENERGAN) 12.5 MG tablet 1-2 tabs po q6h prn nausea/vomiting 20 tablet 3   sulfamethoxazole-trimethoprim (BACTRIM DS) 800-160 MG tablet Take 1 tablet by mouth 2 (two) times daily. 10 tablet 0   TRESIBA FLEXTOUCH 200 UNIT/ML FlexTouch Pen INJECT 50 UNITS INTO THE SKIN DAILY 15 mL 1   No facility-administered medications prior to visit.    Allergies  Allergen Reactions   Levofloxacin In D5w Hives   Penicillins     Blistery rash   Victoza [Liraglutide] Nausea Only    ROS As per HPI  PE: Vitals with BMI 06/24/2021 05/06/2021 04/01/2021  Height 5' 5.75" - 5' 5.75"  Weight 216 lbs 10 oz 222 lbs 13 oz 215 lbs 10 oz  BMI 35.23 - 35.07  Systolic 118 114 545  Diastolic 74 68 80  Pulse 82 77 76     Physical Exam  ***  LABS:  Last CBC Lab Results  Component Value Date   WBC 4.5 04/01/2021   HGB 13.7 04/01/2021   HCT 40.2 04/01/2021   MCV 89.9 04/01/2021   RDW 13.1 04/01/2021   PLT 204.0 04/01/2021   Last metabolic panel Lab Results  Component Value Date   GLUCOSE 318 (H) 04/01/2021   NA 135 04/01/2021   K 3.9 04/01/2021   CL 100 04/01/2021   CO2 25 04/01/2021   BUN 12 04/01/2021   CREATININE 0.58 04/01/2021   CALCIUM 8.9 04/01/2021   PROT 6.5 04/01/2021   ALBUMIN 3.7 04/01/2021   BILITOT 0.5 04/01/2021   ALKPHOS 106 04/01/2021   AST 8 04/01/2021   ALT 7 04/01/2021   Last lipids Lab Results  Component Value Date   CHOL 297 (H) 04/01/2021   HDL 37.40 (L) 04/01/2021   LDLCALC 221 (H) 04/01/2021   LDLDIRECT 317.0 06/21/2019   TRIG 194.0 (H)  04/01/2021   CHOLHDL 8 04/01/2021   Last hemoglobin A1c Lab Results  Component Value Date   HGBA1C 14.8 Repeated and verified X2. (H) 04/01/2021   Last thyroid functions Lab Results  Component Value Date   TSH 0.90 04/01/2021   T3TOTAL 115.5 12/19/2011   IMPRESSION AND PLAN:  No problem-specific Assessment & Plan notes found for this encounter.   An After Visit Summary was printed and given to the patient.  FOLLOW UP: No follow-ups on file.  Signed:  Santiago Bumpers, MD           08/19/2021

## 2021-09-02 ENCOUNTER — Other Ambulatory Visit: Payer: Self-pay | Admitting: Family Medicine

## 2021-10-04 LAB — HM DIABETES EYE EXAM

## 2021-10-07 ENCOUNTER — Encounter: Payer: Self-pay | Admitting: Internal Medicine

## 2021-10-16 ENCOUNTER — Other Ambulatory Visit: Payer: Self-pay | Admitting: Family Medicine

## 2021-11-07 ENCOUNTER — Telehealth: Payer: Self-pay

## 2021-11-07 MED ORDER — FLUCONAZOLE 150 MG PO TABS: ORAL_TABLET | ORAL | 1 refills | Status: DC

## 2021-11-07 NOTE — Addendum Note (Signed)
Addended by: Jeoffrey Massed on: 11/07/2021 05:13 PM   Modules accepted: Orders

## 2021-11-07 NOTE — Telephone Encounter (Signed)
Diflucan  prescription sent.

## 2021-11-07 NOTE — Telephone Encounter (Signed)
Patient refill request.  [facility] [place]  fluconazole (DIFLUCAN) 150 MG tablet

## 2021-11-07 NOTE — Telephone Encounter (Signed)
Offered pt nexrt day appt. Pt states she can not do an appt at this time because she is leaving for a cruise in the AM. Please advise

## 2021-11-08 MED ORDER — FLUCONAZOLE 150 MG PO TABS
ORAL_TABLET | ORAL | 1 refills | Status: DC
Start: 2021-11-08 — End: 2022-03-21

## 2021-11-08 NOTE — Telephone Encounter (Signed)
Attempted to call pt earlier, left message to return call

## 2021-11-08 NOTE — Telephone Encounter (Signed)
Please review and advise.

## 2021-11-08 NOTE — Telephone Encounter (Signed)
Ok new rx sent °

## 2021-11-08 NOTE — Addendum Note (Signed)
Addended by: Jeoffrey Massed on: 11/08/2021 12:26 PM   Modules accepted: Orders

## 2021-11-08 NOTE — Telephone Encounter (Signed)
Patient states that 1 pill is not going to help with the yeast infection she has going on.  Patient to leave in the morning for cruise.  Offered appt but declined.  She states she normally has to have 2-4 pills of Diflucan to help with infection.  Please call [phone]

## 2021-11-14 NOTE — Telephone Encounter (Signed)
Tried calling patient, unable to LVM.  

## 2022-01-06 ENCOUNTER — Telehealth: Payer: Self-pay | Admitting: Family Medicine

## 2022-01-06 NOTE — Telephone Encounter (Signed)
Lmom asking patient to call back to schedule an office visit to have A1C rechecked.

## 2022-01-21 ENCOUNTER — Telehealth: Payer: Self-pay

## 2022-01-21 NOTE — Telephone Encounter (Signed)
Called pt to schedule appt with PCP

## 2022-03-21 ENCOUNTER — Ambulatory Visit: Payer: BC Managed Care – PPO | Admitting: Family Medicine

## 2022-03-21 ENCOUNTER — Telehealth: Payer: Self-pay

## 2022-03-21 VITALS — BP 130/80 | HR 111 | Temp 98.9°F | Ht 65.75 in | Wt 197.6 lb

## 2022-03-21 DIAGNOSIS — R1032 Left lower quadrant pain: Secondary | ICD-10-CM | POA: Diagnosis not present

## 2022-03-21 MED ORDER — MELOXICAM 15 MG PO TABS: 15.0000 mg | ORAL_TABLET | Freq: Every day | ORAL | 0 refills | Status: DC

## 2022-03-21 MED ORDER — FLUCONAZOLE 150 MG PO TABS: ORAL_TABLET | ORAL | 1 refills | Status: DC

## 2022-03-21 MED ORDER — HYDROCODONE-ACETAMINOPHEN 5-325 MG PO TABS: ORAL_TABLET | ORAL | 0 refills | Status: DC

## 2022-03-21 NOTE — Telephone Encounter (Signed)
[facility] - Client TELEPHONE ADVICE RECORD [facility] Patient Name: Kristi Guerrero HE RION Gender: Female DOB: 02/21/1961 Age: 61 Y 70 M 21 D Return Phone Number: [phone] (Primary) Address: City/ State/ Zip: [place] [address] Client [facility] - Client Client Site [place] - Day Provider Crissie Sickles - MD Contact Type Call Who Is Calling Patient / Member / Family / Caregiver Call Type Triage / Clinical Relationship To Patient Self Return Phone Number [phone] (Primary) Chief Complaint Walking difficulty Reason for Call Symptomatic / Request for [place] states she has pulled a muscle in her groin. Caller states she can hardly walk. Caller states she has some Flexeril but it has not helped. Translation No Nurse Assessment Nurse: Hendricks Limes, RN, Kennyth Arnold Date/Time (Eastern Time): 03/20/2022 5:25:18 PM Confirm and document reason for call. If symptomatic, describe symptoms. ---Caller states has groin injury to left 10/10 while moving around more on Monday. Caller reports it is hard to walk on it. Caller reports no other symptoms. Does the patient have any new or worsening symptoms? ---Yes Will a triage be completed? ---Yes Related visit to physician within the last 2 weeks? ---No Does the PT have any chronic conditions? (i.e. diabetes, asthma, this includes High risk factors for pregnancy, etc.) ---Yes List chronic conditions. ---DM2 Is this a behavioral health or substance abuse call? ---No Guidelines Guideline Title Affirmed Question Affirmed Notes Nurse Date/Time (Eastern Time) Groin Injury and Strain [1] MODERATE to SEVERE pain (e.g., interferes with normal activities, awakens from sleep, excruciating) AND [2] no known injury or muscle strain [employer], RN, Kennyth Arnold 03/20/2022 5:26:55 PM PLEASE NOTE: All timestamps contained within  this report are represented as [place] Standard Time. CONFIDENTIALTY NOTICE: This fax transmission is intended only for the addressee. It contains information that is legally privileged, confidential or otherwise protected from use or disclosure. If you are not the intended recipient, you are strictly prohibited from reviewing, disclosing, copying using or disseminating any of this information or taking any action in reliance on or regarding this information. If you have received this fax in error, please notify us immediately by telephone so that we can arrange for its return to [place]. Phone: [phone], Toll-Free: [phone], Fax: [phone] Page: 2 of 2 Call Id: [id number] [place]. Time Eilene Ghazi Time) Disposition Final User 03/20/2022 5:27:53 PM Go to ED Now (or PCP triage) Yes Hendricks Limes, RN, Kennyth Arnold Final Disposition 03/20/2022 5:27:53 PM Go to ED Now (or PCP triage) Yes Hendricks Limes, RN, Vicenta Dunning Disagree/Comply Disagree Caller Understands Yes PreDisposition Did not know what to do Care Advice Given Per Guideline GO TO ED NOW (OR PCP TRIAGE): CARE ADVICE given per Groin Injury and Strain (Adult) guideline. Referrals GO TO FACILITY REFUSED

## 2022-03-21 NOTE — Progress Notes (Signed)
OFFICE VISIT  03/21/2022  CC:  Chief Complaint  Patient presents with   Groin pain    Started Mon evening, just returned from [place]. Pain on left side inside groin area. Tylenol or ibuprofen, nor Flexeril helps    Patient is a 61 y.o. female who presents for groin pain.  HPI: Patient just spent some time vacationing in [place] and walked around quite a bit.  She was getting on and off the bus going up some steps. About 4 days ago she started getting pain in the left groin area.  Gets extreme when she tries to lift her leg such as when trying to get out of a car and going up stairs.  Now is when she just rises up out of her seat she feels bad pain.  There is no pain in her low back, gluteal, lateral hip, or thigh or lower leg.  She has tried ibuprofen, Flexeril, and Tylenol.  None of these helped.  Review of systems: No fever or chills.  No abdominal pain. No paresthesias.  PMP AWARE reviewed today: most recent rx for lorazepam was filled 04/01/21, # 92, rx by me. Most recent vicodin rx filled 04/01/21, #60 rx by me. No red flags.  Past Medical History:  Diagnosis Date   Abnormal nuclear stress test 12/31/2011   Low risk lexiscan, but left heart cath was recommended and this showed normal coronary arteries and normal LV function. (01/01/12)   Anxiety    + ?mood disorder (awaiting old psych records as of 12/19/11.   Carpal tunnel syndrome on both sides    Chronic pain syndrome    low back pain   Colon cancer screening    Pt did not return cologuard 10/2016.  She has chosen not to get colonoscopy as of 2018 f/u.  Pt did not return cologuard 09/2017.   Daytime somnolence    Nuvigil rx'd by her psychiatrist.  Pt reports that no sleep study has been done.   DDD (degenerative disc disease), lumbar    Pt says she has hx of herniated disc that has resulted in some numbness in a few toes on left foot.   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  I have yet to see/hear any evidence of borderline personality d/o in her since she has been my pt.   DM2 (diabetes mellitus, type 2) ([place])    Managed by ENDO--Dr. Cruzita Lederer.  +hx of GDM both pregnancies, dx'd with DM 2 in her 54M--GQQPYP hx of complications  with eyes, kidneys, nerves, or CV system.   Hepatic steatosis    History of COVID-19 03/2020   History of vitamin D deficiency 2009   Hyperlipidemia    Hypertension    Iron deficiency anemia 2013   Hx of: Likely occult upper GI bleeding.  Hb 8.6--came up to 12 at most recent check after getting on integra, which she now continues once daily.   iFOB was negative 01/13/12.  She says she had been taking excessive NSAIDs at the time.  She has never had a colonoscopy.   Low back pain 12/19/2011   Migraine syndrome    Mixed urge and stress incontinence    Multinodular goiter    Hx of multiple benign biopsies; repeated u/s's showed no change until the one on 04/2014.  FNA 06/2013 showed R and isthmus nodules intermediate so f/u bx 6 mo recommended.  Repeat bx 07/26/15 showed R lobe benign, isthmus atypical cells of undetermined signif,--detailed path analysis was done and it was BENIGN.  U/S 11/2017 -->NO CHANGE in nodules.   Obesity    Osteoarthritis 06/2019   tricompartmental, R knee->pt to see ortho   Plantar fasciitis    Steroid injection by podiatrist 09/2017   Renal cyst, acquired, right 2017   Toe fracture, left 09/2017   Distal phalanx L third toe (saw podiatrist)    Past Surgical History:  Procedure Laterality Date   CARDIAC CATHETERIZATION  01/01/12   Normal coronaries and normal LV function.   CESAREAN SECTION     X 2   FNA bx Thyroid nodules  06/29/14   x 4: some findings to support benign cyst and some "cellular atypica of indeterminate significance".--followed by Dr. Elvera Lennox.  Repeat bx 05/2015  BENIGN.    Outpatient Medications Prior to Visit  Medication Sig Dispense Refill   aspirin 81 MG tablet Take 81 mg by mouth daily.     atorvastatin (LIPITOR) 80 MG tablet Take 1 tablet (80 mg total) by mouth daily. 90 tablet 3   busPIRone (BUSPAR) 5 MG tablet 1/2 tab po tid 45 tablet 1   butalbital-acetaminophen-caffeine (FIORICET) 50-325-40 MG tablet take 1 to 2 tablets by mouth twice a day if needed for headache 60 tablet 1   DULoxetine (CYMBALTA) 60 MG capsule Take 1 capsule (60 mg total) by mouth daily. MUST KEEP SCHEDULED OFFICE VISIT 90 capsule 0   gabapentin (NEURONTIN) 300 MG capsule TAKE 1 CAPSULE BY MOUTH THREE TIMES A DAY 90 capsule 0   glucose blood (ONETOUCH VERIO) test strip Use as instructed to check sugar 2 times daily 200 each 5   hydrochlorothiazide (HYDRODIURIL) 25 MG tablet Take 1 tablet (25 mg total) by mouth daily. 90 tablet 3   hydrocortisone (PROCTOSOL HC) 2.5 % rectal cream apply rectally twice a day 30 g 6   Insulin Pen Needle 31G X 4 MM MISC 1 each by Does not apply route 4 (four) times daily. 300 each 3   Lancet Devices (ONE TOUCH DELICA LANCING DEV) MISC Use to check sugar 2 times daily 200 each 5   lisinopril (ZESTRIL) 40 MG tablet Take 1 tablet (40 mg total) by mouth daily. 90 tablet 3   LORazepam (ATIVAN) 0.5 MG tablet Take 2 tablets (1 mg total) by mouth at bedtime. 60 tablet 5   meloxicam (MOBIC) 7.5 MG tablet TAKE 1 TABLET BY MOUTH EVERY DAY 30 tablet 3   metFORMIN (GLUCOPHAGE) 1000 MG tablet TAKE 1 TABLET (1,000 MG TOTAL) BY MOUTH 2 (TWO) TIMES DAILY WITH A MEAL. 60 tablet 0   metoprolol succinate (TOPROL-XL) 25 MG 24 hr tablet TAKE 1 TABLET BY MOUTH EVERY DAY 90 tablet 1   Multiple Vitamin (MULTIVITAMIN) tablet Take 1 tablet by mouth daily.     NOVOLOG FLEXPEN 100 UNIT/ML FlexPen INJECT 14-18 UNITS INTO THE SKIN 3 (THREE) TIMES DAILY BEFORE MEALS. 15 mL 1   Omega-3 Fatty Acids (FISH OIL) 1000 MG CAPS Take 1 capsule by mouth at bedtime.     omeprazole  (PRILOSEC) 40 MG capsule TAKE 1 CAPSULE BY MOUTH EVERY DAY 90 capsule 3   oxybutynin (DITROPAN-XL) 10 MG 24 hr tablet TAKE 1 TABLET BY MOUTH EVERY DAY 30 tablet 5   promethazine (PHENERGAN) 12.5 MG tablet 1-2 tabs po q6h prn nausea/vomiting 20 tablet 3   TRESIBA  FLEXTOUCH 200 UNIT/ML FlexTouch Pen INJECT 50 UNITS INTO THE SKIN DAILY 15 mL 1   fluconazole (DIFLUCAN) 150 MG tablet 1 tab po qd x 5d 5 tablet 1   HYDROcodone-acetaminophen (NORCO/VICODIN) 5-325 MG tablet TAKE 1 TO 2 TABLETS EVERY 6 HOURS AS NEEDED FOR PAIN 60 tablet 0   sulfamethoxazole-trimethoprim (BACTRIM DS) 800-160 MG tablet Take 1 tablet by mouth 2 (two) times daily. 10 tablet 0   No facility-administered medications prior to visit.    Allergies  Allergen Reactions   Levofloxacin In D5w Hives   Penicillins     Blistery rash   Victoza [Liraglutide] Nausea Only    ROS As per HPI  PE:    03/21/2022    2:34 PM 06/24/2021    8:31 AM 05/06/2021    8:05 AM  Vitals with BMI  Height 5' 5.75" 5' 5.75"   Weight 197 lbs 10 oz 216 lbs 10 oz 222 lbs 13 oz  BMI 32.14 35.23   Systolic 130 118 073  Diastolic 80 74 68  Pulse 111 82 77     Physical Exam  Exam chaperoned by Emi Holes, CMA. General: Alert, tired appearing but in no distress.  She is pleasant and conversant. She has no tenderness to palpation in the lower abdomen, groin, or thigh. Active range of motion of the hip is severely restricted.  In fact just lifting/flexing the thigh 5 degrees elicits severe pain in left groin and she has to drop it.  Passive range of motion of the left hip is about 90 degrees of flexion but minimized to about 5 to 10% internal rotation and external rotation due to eliciting left groin pain.  LABS:  Last CBC Lab Results  Component Value Date   WBC 4.5 04/01/2021   HGB 13.7 04/01/2021   HCT 40.2 04/01/2021   MCV 89.9 04/01/2021   RDW 13.1 04/01/2021   PLT 204.0 04/01/2021   Last metabolic panel Lab Results  Component  Value Date   GLUCOSE 318 (H) 04/01/2021   NA 135 04/01/2021   K 3.9 04/01/2021   CL 100 04/01/2021   CO2 25 04/01/2021   BUN 12 04/01/2021   CREATININE 0.58 04/01/2021   CALCIUM 8.9 04/01/2021   PROT 6.5 04/01/2021   ALBUMIN 3.7 04/01/2021   BILITOT 0.5 04/01/2021   ALKPHOS 106 04/01/2021   AST 8 04/01/2021   ALT 7 04/01/2021   Last hemoglobin A1c Lab Results  Component Value Date   HGBA1C 14.8 Repeated and verified X2. (H) 04/01/2021   Last thyroid functions Lab Results  Component Value Date   TSH 0.90 04/01/2021   T3TOTAL 115.5 12/19/2011   IMPRESSION AND PLAN:  Left groin strain. Pain is very severe and somewhat out of proportion to injury. She could be having some secondary muscle spasm. Mechanism of injury does not support muscle tear. We will have low threshold to image left hip and pelvis. I recommended ongoing rest, ice, start meloxicam 15 mg a day x7 days. Vicodin 5/325, 1-2 every 6 hours as needed, #30. I recommended she not use any muscle relaxers.  An After Visit Summary was printed and given to the patient.  FOLLOW UP: Return for make f/u appt for meds/labs in 1-2 wks.  Signed:  Santiago Bumpers, MD           03/21/2022

## 2022-03-21 NOTE — Telephone Encounter (Signed)
Pt is scheduled with provider this afternoon.

## 2022-03-27 ENCOUNTER — Other Ambulatory Visit: Payer: Self-pay

## 2022-03-27 MED ORDER — MELOXICAM 15 MG PO TABS: 15.0000 mg | ORAL_TABLET | Freq: Every day | ORAL | 0 refills | Status: DC

## 2022-03-27 MED ORDER — HYDROCODONE-ACETAMINOPHEN 5-325 MG PO TABS: ORAL_TABLET | ORAL | 0 refills | Status: DC

## 2022-03-27 NOTE — Telephone Encounter (Signed)
Pt called stating that she was to call if her pain is not any better. Some times it is better but sometimes it can be worse than when she was seen. Pt states she wanted to know if she could get a refill on the pain medication that was given. States it was discussed to have xray done if not getting better. Please advise, medications pending

## 2022-03-27 NOTE — Telephone Encounter (Signed)
Attempted to contact pt. Could not LVM

## 2022-03-27 NOTE — Telephone Encounter (Signed)
Hydrocodone and meloxicam prescriptions sent. I want her to get x-rays. Please order "DG Hip with pelvis complete" at her imaging location of choice, dx left groin pain and left hip pain. Arrange follow-up with me next week.

## 2022-03-27 NOTE — Addendum Note (Signed)
Addended by: Deveron Furlong D on: 03/27/2022 04:45 PM   Modules accepted: Orders

## 2022-03-30 DIAGNOSIS — M160 Bilateral primary osteoarthritis of hip: Secondary | ICD-10-CM | POA: Diagnosis not present

## 2022-03-30 DIAGNOSIS — M25552 Pain in left hip: Secondary | ICD-10-CM | POA: Diagnosis not present

## 2022-03-30 DIAGNOSIS — M1612 Unilateral primary osteoarthritis, left hip: Secondary | ICD-10-CM | POA: Diagnosis not present

## 2022-03-31 NOTE — Telephone Encounter (Signed)
Pt went to ED yesterday, she has a follow up with ortho (she has osteoarthritis). She was given Percocet 10-325mg  and shot of toradol. She is scheduled for CPE on 10/23. She is requesting a prescription for Percocet 10-325mg  instead. The Hydrocodone 5-325 have not been very helpful with pain at all. Her pain level stays at 9 out of 10.

## 2022-04-02 ENCOUNTER — Telehealth: Payer: Self-pay | Admitting: Family Medicine

## 2022-04-02 MED ORDER — OXYCODONE-ACETAMINOPHEN 10-325 MG PO TABS: 1.0000 | ORAL_TABLET | Freq: Four times a day (QID) | ORAL | 0 refills | Status: DC | PRN

## 2022-04-02 NOTE — Telephone Encounter (Signed)
(  Online controlled substance prescribing database reviewed today: Most recent refill for Vicodin was 03/27/2022.  No oxycodone is listed.)  I have sent a prescription for Percocet 10-325. Given the severity of her pain I want her to get a hip MRI. Ask her what imaging place she can go to and will need to arrange it. MRI left hip w/out and with contrast, dx left hip pain, osteoarthritis left hip.  Put in comments section "suspicion of AVN).

## 2022-04-02 NOTE — Telephone Encounter (Signed)
LM for pt to return to call to discuss.

## 2022-04-02 NOTE — Telephone Encounter (Signed)
Pt went to ED on 10/15 , dx with osteoarthritis. She was given Percocet 10-325mg  and shot of toradol. She is scheduled for CPE on 10/23. She is requesting a prescription for Percocet 10-325mg  instead. The Hydrocodone 5-325 have not been very helpful with pain at all. Her pain level stays at 9 out of 10.

## 2022-04-02 NOTE — Telephone Encounter (Signed)
Pt called and states she is still experiencing a lot of pain in the groin area. She went to the ER, and they diagnosed her with arthritis. However, she is asking for stronger pain medications because the vidicon is not working. She denied an appt on Thursday due to work schedule.

## 2022-04-03 NOTE — Telephone Encounter (Signed)
Please advise if different suggestion based on message below.

## 2022-04-03 NOTE — Telephone Encounter (Signed)
noted 

## 2022-04-03 NOTE — Telephone Encounter (Signed)
LM for pt to return call to discuss.  

## 2022-04-03 NOTE — Telephone Encounter (Signed)
Patient returning Britt;s call.  Please call patient back when available.   She wanted to let Dr. Anitra Lauth know she has an appointment tomorrow with Orthopedic doctor in [place], [place].  Leodis Sias, MD [email or web address] [address] [place], [place], [place] [address]   [phone]

## 2022-04-04 DIAGNOSIS — M1612 Unilateral primary osteoarthritis, left hip: Secondary | ICD-10-CM | POA: Diagnosis not present

## 2022-04-07 ENCOUNTER — Ambulatory Visit: Payer: 59 | Admitting: Family Medicine

## 2022-04-07 ENCOUNTER — Other Ambulatory Visit: Payer: Self-pay | Admitting: Family Medicine

## 2022-04-07 MED ORDER — OXYCODONE-ACETAMINOPHEN 10-325 MG PO TABS: ORAL_TABLET | ORAL | 0 refills | Status: DC
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
## 2022-04-07 NOTE — Telephone Encounter (Signed)
Pt is asking for a refill on Ozycodone. 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 She is rescheduled to Oct. 30.

## 2022-04-07 NOTE — Telephone Encounter (Signed)
She called back and wanted  To switch her pharmacy to [facility] in [place].

## 2022-04-07 NOTE — Telephone Encounter (Signed)
Pt is going to [facility] for injection.

## 2022-04-07 NOTE — Telephone Encounter (Signed)
Pt advised refill sent. °

## 2022-04-07 NOTE — Telephone Encounter (Signed)
Requesting: Oxycodone Contract: n/a for this med UDS: n/a for this med Last Visit: 03/21/22 Next Visit: 04/14/22 Last Refill: 04/02/22 (20,0)  Please Advise. Med pending

## 2022-04-08 DIAGNOSIS — E785 Hyperlipidemia, unspecified: Secondary | ICD-10-CM | POA: Diagnosis not present

## 2022-04-08 DIAGNOSIS — R252 Cramp and spasm: Secondary | ICD-10-CM | POA: Diagnosis not present

## 2022-04-08 DIAGNOSIS — M160 Bilateral primary osteoarthritis of hip: Secondary | ICD-10-CM | POA: Diagnosis not present

## 2022-04-08 DIAGNOSIS — R1032 Left lower quadrant pain: Secondary | ICD-10-CM | POA: Diagnosis not present

## 2022-04-08 DIAGNOSIS — I10 Essential (primary) hypertension: Secondary | ICD-10-CM | POA: Diagnosis not present

## 2022-04-08 DIAGNOSIS — M25552 Pain in left hip: Secondary | ICD-10-CM | POA: Diagnosis not present

## 2022-04-08 DIAGNOSIS — M1612 Unilateral primary osteoarthritis, left hip: Secondary | ICD-10-CM | POA: Diagnosis not present

## 2022-04-08 DIAGNOSIS — M25452 Effusion, left hip: Secondary | ICD-10-CM | POA: Diagnosis not present

## 2022-04-08 DIAGNOSIS — E1165 Type 2 diabetes mellitus with hyperglycemia: Secondary | ICD-10-CM | POA: Diagnosis not present

## 2022-04-08 DIAGNOSIS — R110 Nausea: Secondary | ICD-10-CM | POA: Diagnosis not present

## 2022-04-10 ENCOUNTER — Telehealth: Payer: Self-pay | Admitting: Family Medicine

## 2022-04-10 DIAGNOSIS — M1612 Unilateral primary osteoarthritis, left hip: Secondary | ICD-10-CM | POA: Diagnosis not present

## 2022-04-10 DIAGNOSIS — M25552 Pain in left hip: Secondary | ICD-10-CM | POA: Diagnosis not present

## 2022-04-10 NOTE — Telephone Encounter (Signed)
Pt has upcoming appt on 10/30. Please advise if any medication can be prescribed prior

## 2022-04-10 NOTE — Telephone Encounter (Signed)
Pt has called once again stating that her pain is unbearable and now complains of nausea. She keeps saying that nothing is helping the pain and that she went to her Ortho dr and they wouldn't prescribe her anything for pain. Please contact patient regarding nausea and pain.

## 2022-04-11 DIAGNOSIS — M1612 Unilateral primary osteoarthritis, left hip: Secondary | ICD-10-CM | POA: Diagnosis not present

## 2022-04-12 DIAGNOSIS — I517 Cardiomegaly: Secondary | ICD-10-CM | POA: Diagnosis not present

## 2022-04-12 DIAGNOSIS — Z6832 Body mass index (BMI) 32.0-32.9, adult: Secondary | ICD-10-CM | POA: Diagnosis not present

## 2022-04-12 DIAGNOSIS — Z888 Allergy status to other drugs, medicaments and biological substances status: Secondary | ICD-10-CM | POA: Diagnosis not present

## 2022-04-12 DIAGNOSIS — Z8249 Family history of ischemic heart disease and other diseases of the circulatory system: Secondary | ICD-10-CM | POA: Diagnosis not present

## 2022-04-12 DIAGNOSIS — R7881 Bacteremia: Secondary | ICD-10-CM | POA: Diagnosis not present

## 2022-04-12 DIAGNOSIS — F319 Bipolar disorder, unspecified: Secondary | ICD-10-CM | POA: Diagnosis not present

## 2022-04-12 DIAGNOSIS — F419 Anxiety disorder, unspecified: Secondary | ICD-10-CM | POA: Diagnosis not present

## 2022-04-12 DIAGNOSIS — Z880 Allergy status to penicillin: Secondary | ICD-10-CM | POA: Diagnosis not present

## 2022-04-12 DIAGNOSIS — M009 Pyogenic arthritis, unspecified: Secondary | ICD-10-CM | POA: Diagnosis not present

## 2022-04-12 DIAGNOSIS — Z881 Allergy status to other antibiotic agents status: Secondary | ICD-10-CM | POA: Diagnosis not present

## 2022-04-12 DIAGNOSIS — Z794 Long term (current) use of insulin: Secondary | ICD-10-CM | POA: Diagnosis not present

## 2022-04-12 DIAGNOSIS — Z833 Family history of diabetes mellitus: Secondary | ICD-10-CM | POA: Diagnosis not present

## 2022-04-12 DIAGNOSIS — E1140 Type 2 diabetes mellitus with diabetic neuropathy, unspecified: Secondary | ICD-10-CM | POA: Diagnosis not present

## 2022-04-12 DIAGNOSIS — E782 Mixed hyperlipidemia: Secondary | ICD-10-CM | POA: Diagnosis not present

## 2022-04-12 DIAGNOSIS — E785 Hyperlipidemia, unspecified: Secondary | ICD-10-CM | POA: Diagnosis not present

## 2022-04-12 DIAGNOSIS — E1165 Type 2 diabetes mellitus with hyperglycemia: Secondary | ICD-10-CM | POA: Diagnosis not present

## 2022-04-12 DIAGNOSIS — M00052 Staphylococcal arthritis, left hip: Secondary | ICD-10-CM | POA: Diagnosis not present

## 2022-04-12 DIAGNOSIS — Z01810 Encounter for preprocedural cardiovascular examination: Secondary | ICD-10-CM | POA: Diagnosis not present

## 2022-04-12 DIAGNOSIS — M1612 Unilateral primary osteoarthritis, left hip: Secondary | ICD-10-CM | POA: Diagnosis not present

## 2022-04-12 DIAGNOSIS — S79912A Unspecified injury of left hip, initial encounter: Secondary | ICD-10-CM | POA: Diagnosis not present

## 2022-04-12 DIAGNOSIS — B9561 Methicillin susceptible Staphylococcus aureus infection as the cause of diseases classified elsewhere: Secondary | ICD-10-CM | POA: Diagnosis not present

## 2022-04-12 DIAGNOSIS — R600 Localized edema: Secondary | ICD-10-CM | POA: Diagnosis not present

## 2022-04-12 DIAGNOSIS — I10 Essential (primary) hypertension: Secondary | ICD-10-CM | POA: Diagnosis not present

## 2022-04-12 DIAGNOSIS — E669 Obesity, unspecified: Secondary | ICD-10-CM | POA: Diagnosis not present

## 2022-04-12 DIAGNOSIS — M25452 Effusion, left hip: Secondary | ICD-10-CM | POA: Diagnosis not present

## 2022-04-12 DIAGNOSIS — I77819 Aortic ectasia, unspecified site: Secondary | ICD-10-CM | POA: Diagnosis not present

## 2022-04-12 DIAGNOSIS — M25552 Pain in left hip: Secondary | ICD-10-CM | POA: Diagnosis not present

## 2022-04-14 ENCOUNTER — Ambulatory Visit: Payer: 59 | Admitting: Family Medicine

## 2022-04-14 ENCOUNTER — Encounter: Payer: Self-pay | Admitting: Family Medicine

## 2022-04-14 DIAGNOSIS — E119 Type 2 diabetes mellitus without complications: Secondary | ICD-10-CM

## 2022-04-14 DIAGNOSIS — E7800 Pure hypercholesterolemia, unspecified: Secondary | ICD-10-CM

## 2022-04-14 DIAGNOSIS — I10 Essential (primary) hypertension: Secondary | ICD-10-CM

## 2022-04-24 DIAGNOSIS — Z7409 Other reduced mobility: Secondary | ICD-10-CM | POA: Diagnosis not present

## 2022-04-24 DIAGNOSIS — R5381 Other malaise: Secondary | ICD-10-CM | POA: Diagnosis not present

## 2022-04-24 DIAGNOSIS — R7881 Bacteremia: Secondary | ICD-10-CM | POA: Diagnosis not present

## 2022-04-24 DIAGNOSIS — Z736 Limitation of activities due to disability: Secondary | ICD-10-CM | POA: Diagnosis not present

## 2022-04-24 DIAGNOSIS — M25552 Pain in left hip: Secondary | ICD-10-CM | POA: Diagnosis not present

## 2022-04-24 DIAGNOSIS — F319 Bipolar disorder, unspecified: Secondary | ICD-10-CM | POA: Diagnosis not present

## 2022-04-24 DIAGNOSIS — B9561 Methicillin susceptible Staphylococcus aureus infection as the cause of diseases classified elsewhere: Secondary | ICD-10-CM | POA: Diagnosis not present

## 2022-04-24 DIAGNOSIS — E876 Hypokalemia: Secondary | ICD-10-CM | POA: Diagnosis not present

## 2022-04-24 DIAGNOSIS — M009 Pyogenic arthritis, unspecified: Secondary | ICD-10-CM | POA: Diagnosis not present

## 2022-04-24 DIAGNOSIS — L89313 Pressure ulcer of right buttock, stage 3: Secondary | ICD-10-CM | POA: Diagnosis not present

## 2022-04-24 DIAGNOSIS — M00052 Staphylococcal arthritis, left hip: Secondary | ICD-10-CM | POA: Diagnosis not present

## 2022-04-24 DIAGNOSIS — G629 Polyneuropathy, unspecified: Secondary | ICD-10-CM | POA: Diagnosis not present

## 2022-04-24 DIAGNOSIS — K219 Gastro-esophageal reflux disease without esophagitis: Secondary | ICD-10-CM | POA: Diagnosis not present

## 2022-04-24 DIAGNOSIS — E1165 Type 2 diabetes mellitus with hyperglycemia: Secondary | ICD-10-CM | POA: Diagnosis not present

## 2022-04-24 DIAGNOSIS — E785 Hyperlipidemia, unspecified: Secondary | ICD-10-CM | POA: Diagnosis not present

## 2022-04-24 DIAGNOSIS — Z794 Long term (current) use of insulin: Secondary | ICD-10-CM | POA: Diagnosis not present

## 2022-04-24 DIAGNOSIS — M1612 Unilateral primary osteoarthritis, left hip: Secondary | ICD-10-CM | POA: Diagnosis not present

## 2022-04-24 DIAGNOSIS — I10 Essential (primary) hypertension: Secondary | ICD-10-CM | POA: Diagnosis not present

## 2022-05-02 DIAGNOSIS — M009 Pyogenic arthritis, unspecified: Secondary | ICD-10-CM | POA: Diagnosis not present

## 2022-05-07 ENCOUNTER — Telehealth: Payer: Self-pay

## 2022-05-07 DIAGNOSIS — M00052 Staphylococcal arthritis, left hip: Secondary | ICD-10-CM | POA: Diagnosis not present

## 2022-05-07 DIAGNOSIS — R7881 Bacteremia: Secondary | ICD-10-CM | POA: Diagnosis not present

## 2022-05-07 NOTE — Telephone Encounter (Signed)
Transition Care Management Unsuccessful Follow-up Telephone Call  Date of discharge and from where:  05/07/22 [facility]) [facility]  Attempts:  1st Attempt  Reason for unsuccessful TCM follow-up call:  Left voice message

## 2022-05-09 DIAGNOSIS — R7881 Bacteremia: Secondary | ICD-10-CM | POA: Diagnosis not present

## 2022-05-09 DIAGNOSIS — M00052 Staphylococcal arthritis, left hip: Secondary | ICD-10-CM | POA: Diagnosis not present

## 2022-05-10 DIAGNOSIS — E042 Nontoxic multinodular goiter: Secondary | ICD-10-CM | POA: Diagnosis not present

## 2022-05-10 DIAGNOSIS — M25452 Effusion, left hip: Secondary | ICD-10-CM | POA: Diagnosis not present

## 2022-05-10 DIAGNOSIS — G8929 Other chronic pain: Secondary | ICD-10-CM | POA: Diagnosis not present

## 2022-05-10 DIAGNOSIS — B9561 Methicillin susceptible Staphylococcus aureus infection as the cause of diseases classified elsewhere: Secondary | ICD-10-CM | POA: Diagnosis not present

## 2022-05-10 DIAGNOSIS — E1165 Type 2 diabetes mellitus with hyperglycemia: Secondary | ICD-10-CM | POA: Diagnosis not present

## 2022-05-10 DIAGNOSIS — I10 Essential (primary) hypertension: Secondary | ICD-10-CM | POA: Diagnosis not present

## 2022-05-10 DIAGNOSIS — R7881 Bacteremia: Secondary | ICD-10-CM | POA: Diagnosis not present

## 2022-05-10 DIAGNOSIS — M545 Low back pain, unspecified: Secondary | ICD-10-CM | POA: Diagnosis not present

## 2022-05-10 DIAGNOSIS — F419 Anxiety disorder, unspecified: Secondary | ICD-10-CM | POA: Diagnosis not present

## 2022-05-10 DIAGNOSIS — M00052 Staphylococcal arthritis, left hip: Secondary | ICD-10-CM | POA: Diagnosis not present

## 2022-05-10 DIAGNOSIS — F3132 Bipolar disorder, current episode depressed, moderate: Secondary | ICD-10-CM | POA: Diagnosis not present

## 2022-05-10 DIAGNOSIS — L89152 Pressure ulcer of sacral region, stage 2: Secondary | ICD-10-CM | POA: Diagnosis not present

## 2022-05-10 DIAGNOSIS — D509 Iron deficiency anemia, unspecified: Secondary | ICD-10-CM | POA: Diagnosis not present

## 2022-05-10 DIAGNOSIS — G44229 Chronic tension-type headache, not intractable: Secondary | ICD-10-CM | POA: Diagnosis not present

## 2022-05-10 DIAGNOSIS — E785 Hyperlipidemia, unspecified: Secondary | ICD-10-CM | POA: Diagnosis not present

## 2022-05-10 DIAGNOSIS — E1142 Type 2 diabetes mellitus with diabetic polyneuropathy: Secondary | ICD-10-CM | POA: Diagnosis not present

## 2022-05-11 DIAGNOSIS — R7881 Bacteremia: Secondary | ICD-10-CM | POA: Diagnosis not present

## 2022-05-11 DIAGNOSIS — M00052 Staphylococcal arthritis, left hip: Secondary | ICD-10-CM | POA: Diagnosis not present

## 2022-05-12 ENCOUNTER — Telehealth: Payer: Self-pay

## 2022-05-12 DIAGNOSIS — E1165 Type 2 diabetes mellitus with hyperglycemia: Secondary | ICD-10-CM | POA: Diagnosis not present

## 2022-05-12 DIAGNOSIS — E785 Hyperlipidemia, unspecified: Secondary | ICD-10-CM | POA: Diagnosis not present

## 2022-05-12 DIAGNOSIS — E042 Nontoxic multinodular goiter: Secondary | ICD-10-CM | POA: Diagnosis not present

## 2022-05-12 DIAGNOSIS — B9561 Methicillin susceptible Staphylococcus aureus infection as the cause of diseases classified elsewhere: Secondary | ICD-10-CM | POA: Diagnosis not present

## 2022-05-12 DIAGNOSIS — R7881 Bacteremia: Secondary | ICD-10-CM | POA: Diagnosis not present

## 2022-05-12 DIAGNOSIS — D509 Iron deficiency anemia, unspecified: Secondary | ICD-10-CM | POA: Diagnosis not present

## 2022-05-12 DIAGNOSIS — M25452 Effusion, left hip: Secondary | ICD-10-CM | POA: Diagnosis not present

## 2022-05-12 DIAGNOSIS — F419 Anxiety disorder, unspecified: Secondary | ICD-10-CM | POA: Diagnosis not present

## 2022-05-12 DIAGNOSIS — L89152 Pressure ulcer of sacral region, stage 2: Secondary | ICD-10-CM | POA: Diagnosis not present

## 2022-05-12 DIAGNOSIS — G44229 Chronic tension-type headache, not intractable: Secondary | ICD-10-CM | POA: Diagnosis not present

## 2022-05-12 DIAGNOSIS — E1142 Type 2 diabetes mellitus with diabetic polyneuropathy: Secondary | ICD-10-CM

## 2022-05-12 DIAGNOSIS — M00052 Staphylococcal arthritis, left hip: Secondary | ICD-10-CM | POA: Diagnosis not present

## 2022-05-12 DIAGNOSIS — I10 Essential (primary) hypertension: Secondary | ICD-10-CM | POA: Diagnosis not present

## 2022-05-12 DIAGNOSIS — M545 Low back pain, unspecified: Secondary | ICD-10-CM | POA: Diagnosis not present

## 2022-05-12 DIAGNOSIS — F3132 Bipolar disorder, current episode depressed, moderate: Secondary | ICD-10-CM | POA: Diagnosis not present

## 2022-05-12 DIAGNOSIS — G8929 Other chronic pain: Secondary | ICD-10-CM | POA: Diagnosis not present

## 2022-05-12 NOTE — Telephone Encounter (Signed)
Caller/Agency: Toniann Fail @ [facility] Callback Number: [phone]  Requesting OT/PT/Skilled Nursing/Social Work/Speech Therapy:  Change in Wound Care Therapy Patient has a pressure sore; Stage 2   Frequency:  3 times weekly for 4-6 weeks  *aware that provider out of office today, and because of time of call after 4:00PM; call will not be addressed until tomorrow.

## 2022-05-13 DIAGNOSIS — M00052 Staphylococcal arthritis, left hip: Secondary | ICD-10-CM | POA: Diagnosis not present

## 2022-05-13 DIAGNOSIS — R7881 Bacteremia: Secondary | ICD-10-CM | POA: Diagnosis not present

## 2022-05-13 NOTE — Telephone Encounter (Signed)
LVM for Toniann Fail regarding approved orders.

## 2022-05-13 NOTE — Telephone Encounter (Signed)
Okay for orders? 

## 2022-05-13 NOTE — Telephone Encounter (Signed)
(  Pt has appt with me 05/15/22)  Yes, okay

## 2022-05-14 DIAGNOSIS — I10 Essential (primary) hypertension: Secondary | ICD-10-CM | POA: Diagnosis not present

## 2022-05-14 DIAGNOSIS — M545 Low back pain, unspecified: Secondary | ICD-10-CM | POA: Diagnosis not present

## 2022-05-14 DIAGNOSIS — B9561 Methicillin susceptible Staphylococcus aureus infection as the cause of diseases classified elsewhere: Secondary | ICD-10-CM | POA: Diagnosis not present

## 2022-05-14 DIAGNOSIS — F3132 Bipolar disorder, current episode depressed, moderate: Secondary | ICD-10-CM | POA: Diagnosis not present

## 2022-05-14 DIAGNOSIS — E1165 Type 2 diabetes mellitus with hyperglycemia: Secondary | ICD-10-CM | POA: Diagnosis not present

## 2022-05-14 DIAGNOSIS — E785 Hyperlipidemia, unspecified: Secondary | ICD-10-CM | POA: Diagnosis not present

## 2022-05-14 DIAGNOSIS — D509 Iron deficiency anemia, unspecified: Secondary | ICD-10-CM | POA: Diagnosis not present

## 2022-05-14 DIAGNOSIS — L89152 Pressure ulcer of sacral region, stage 2: Secondary | ICD-10-CM | POA: Diagnosis not present

## 2022-05-14 DIAGNOSIS — F419 Anxiety disorder, unspecified: Secondary | ICD-10-CM | POA: Diagnosis not present

## 2022-05-14 DIAGNOSIS — E042 Nontoxic multinodular goiter: Secondary | ICD-10-CM | POA: Diagnosis not present

## 2022-05-14 DIAGNOSIS — G8929 Other chronic pain: Secondary | ICD-10-CM | POA: Diagnosis not present

## 2022-05-14 DIAGNOSIS — R7881 Bacteremia: Secondary | ICD-10-CM | POA: Diagnosis not present

## 2022-05-14 DIAGNOSIS — M25452 Effusion, left hip: Secondary | ICD-10-CM | POA: Diagnosis not present

## 2022-05-14 DIAGNOSIS — G44229 Chronic tension-type headache, not intractable: Secondary | ICD-10-CM | POA: Diagnosis not present

## 2022-05-14 DIAGNOSIS — M00052 Staphylococcal arthritis, left hip: Secondary | ICD-10-CM | POA: Diagnosis not present

## 2022-05-14 DIAGNOSIS — E1142 Type 2 diabetes mellitus with diabetic polyneuropathy: Secondary | ICD-10-CM | POA: Diagnosis not present

## 2022-05-14 MED ORDER — BLOOD GLUCOSE MONITOR KIT: PACK | 0 refills | Status: AC

## 2022-05-14 NOTE — Telephone Encounter (Signed)
Kristi Guerrero was given approved orders. She would like glucose meter sent to pharmacy as well since the patient is diabetic.

## 2022-05-15 ENCOUNTER — Encounter: Payer: Self-pay | Admitting: Family Medicine

## 2022-05-15 ENCOUNTER — Ambulatory Visit (INDEPENDENT_AMBULATORY_CARE_PROVIDER_SITE_OTHER): Payer: BC Managed Care – PPO | Admitting: Family Medicine

## 2022-05-15 VITALS — BP 121/72 | HR 105 | Temp 97.9°F | Wt 175.8 lb

## 2022-05-15 DIAGNOSIS — E876 Hypokalemia: Secondary | ICD-10-CM

## 2022-05-15 DIAGNOSIS — R2681 Unsteadiness on feet: Secondary | ICD-10-CM

## 2022-05-15 DIAGNOSIS — I10 Essential (primary) hypertension: Secondary | ICD-10-CM

## 2022-05-15 DIAGNOSIS — D649 Anemia, unspecified: Secondary | ICD-10-CM

## 2022-05-15 DIAGNOSIS — E1165 Type 2 diabetes mellitus with hyperglycemia: Secondary | ICD-10-CM

## 2022-05-15 DIAGNOSIS — R5381 Other malaise: Secondary | ICD-10-CM

## 2022-05-15 DIAGNOSIS — B9561 Methicillin susceptible Staphylococcus aureus infection as the cause of diseases classified elsewhere: Secondary | ICD-10-CM

## 2022-05-15 DIAGNOSIS — E118 Type 2 diabetes mellitus with unspecified complications: Secondary | ICD-10-CM | POA: Diagnosis not present

## 2022-05-15 DIAGNOSIS — R7881 Bacteremia: Secondary | ICD-10-CM | POA: Diagnosis not present

## 2022-05-15 DIAGNOSIS — M25552 Pain in left hip: Secondary | ICD-10-CM

## 2022-05-15 DIAGNOSIS — M00052 Staphylococcal arthritis, left hip: Secondary | ICD-10-CM

## 2022-05-15 DIAGNOSIS — Z9889 Other specified postprocedural states: Secondary | ICD-10-CM

## 2022-05-15 DIAGNOSIS — Z7409 Other reduced mobility: Secondary | ICD-10-CM

## 2022-05-15 MED ORDER — POTASSIUM CHLORIDE CRYS ER 20 MEQ PO TBCR: 20.0000 meq | EXTENDED_RELEASE_TABLET | Freq: Every day | ORAL | 0 refills | Status: DC

## 2022-05-15 MED ORDER — OXYCODONE HCL 5 MG PO TABS: ORAL_TABLET | ORAL | 0 refills | Status: AC

## 2022-05-15 MED ORDER — METHOCARBAMOL 750 MG PO TABS: 750.0000 mg | ORAL_TABLET | Freq: Three times a day (TID) | ORAL | 1 refills | Status: AC | PRN

## 2022-05-15 NOTE — Progress Notes (Signed)
OFFICE VISIT  05/15/2022  CC:  Chief Complaint  Patient presents with   rehab f/u   Patient is a 61 y.o. female who presents for follow-up left hip pain.  HPI: Since I last saw her on 03/21/2022 a lot has happened. 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  She underwent incision and drainage on 04/14/2022, hospitalized for 10 days after. She then went to rehab center from 11/9 to 11/22, 2023.  She went home with a PICC line, on Ancef 2g tid which is scheduled to finish on December 14. She has a significant mount of underlying osteoarthritis in the hip.  She is debilitated due to the progressive pain and the subsequent procedure and hospitalization + ongoing pain. She may ultimately need hip replacement surgery after her infection has completely cleared.  Labs on 04/21/2022 showed white blood cells 6.7 thousand, hemoglobin 10.4, platelets 339 K.  Basic metabolic panel showed sodium 137, potassium 3.5, chloride 96, bicarb 35, BUN 8, creatinine 0.4, glucose 241, calcium 7.8. Hemoglobin A1c was 10.9% on 04/13/2022.  UPDATE:  Still in severe pain, rates it a 10 out of 10 all the time. Says oxycodone twice daily helps minimally.  Robaxin helps minimally. Cymbalta was added by orthopedics as adjunctive for pain control, 60 mg a day.  She is taking 14 units of Levemir daily.  She is currently getting home health PT as well as home health nursing. Labs are being done at home for monitoring while on Ancef.  Most recent on 76/54/6503: Metabolic panel normal except potassium borderline low at 3.4. CBC normal except hemoglobin mildly low at 11.4.  Sed rate was 27 and CRP was 2.2.  PMP AWARE reviewed today: most recent rx for oxycodone (85m) was filled 05/09/2022, #20, rx by Dr. DMagnus Sinning Most recent opioid prescription that was prescribed by me was filled on 04/07/2022--Percocet--#20. No red flags.  Past Medical History:  Diagnosis Date    Abnormal nuclear stress test 12/31/2011   Low risk lexiscan, but left heart cath was recommended and this showed normal coronary arteries and normal LV function. (01/01/12)   Anxiety    + ?mood disorder (awaiting old psych records as of 12/19/11.   Carpal tunnel syndrome on both sides    Chronic pain syndrome    low back pain   Colon cancer screening    Pt did not return cologuard 10/2016.  She has chosen not to get colonoscopy as of 2018 f/u.  Pt did not return cologuard 09/2017.   Daytime somnolence    Nuvigil rx'd by her psychiatrist.  Pt reports that no sleep study has been done.   DDD (degenerative disc disease), lumbar    Pt says she has hx of herniated disc that has resulted in some numbness in a few toes on left foot.   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  I have yet to see/hear any evidence of borderline personality d/o in her since she has been my pt.   DM[place]di[place], [place] 2[place]    Managed by [facility]--Dr. GCruzita Lederer  +hx of GDM both pregnancies, dx'd with DM 2 in her 354S--FKCLEXhx of complications  with eyes, kidneys, nerves, or CV system.   Hepatic steatosis    History of COVID-19  03/2020   History of vitamin D deficiency 2009   Hyperlipidemia    Hypertension    Iron deficiency anemia 2013   Hx of: Likely occult upper GI bleeding.  Hb 8.6--came up to 12 at most recent check after getting on integra, which she now continues once daily.   iFOB was negative 01/13/12.  She says she had been taking excessive NSAIDs at the time.  She has never had a colonoscopy.   Low back pain 12/19/2011   Migraine syndrome    Mixed urge and stress incontinence    Multinodular goiter    Hx of multiple benign biopsies; repeated u/s's showed no change until the one on 04/2014.  FNA 06/2013 showed R and isthmus  nodules intermediate so f/u bx 6 mo recommended.  Repeat bx 07/26/15 showed R lobe benign, isthmus atypical cells of undetermined signif,--detailed path analysis was done and it was BENIGN.  U/S 11/2017 -->NO CHANGE in nodules.   Obesity    Osteoarthritis 06/2019   tricompartmental, R knee->pt to see ortho   Plantar fasciitis    Steroid injection by podiatrist 09/2017   Renal cyst, acquired, right 2017   Toe fracture, left 09/2017   Distal phalanx L third toe (saw podiatrist)    Past Surgical History:  Procedure Laterality Date   CARDIAC CATHETERIZATION  01/01/12   Normal coronaries and normal LV function.   CESAREAN SECTION     X 2   FNA bx Thyroid nodules  06/29/14   x 4: some findings to support benign cyst and some "cellular atypica of indeterminate significance".--followed by Dr. Cruzita Lederer.  Repeat bx 05/2015 BENIGN.    Outpatient Medications Prior to Visit  Medication Sig Dispense Refill   atorvastatin (LIPITOR) 40 MG tablet Take 1 tablet by mouth at bedtime.     DULoxetine (CYMBALTA) 60 MG capsule Take 1 capsule (60 mg total) by mouth daily. MUST KEEP SCHEDULED OFFICE VISIT 90 capsule 0   LEVEMIR 100 UNIT/ML injection Inject into the skin.     losartan (COZAAR) 25 MG tablet Take 1 tablet by mouth daily.     omeprazole (PRILOSEC) 40 MG capsule TAKE 1 CAPSULE BY MOUTH EVERY DAY 90 capsule 3   senna-docusate (SENOKOT-S) 8.6-50 MG tablet Take 1 tablet by mouth 2 (two) times daily.     traZODone (DESYREL) 50 MG tablet Take 1 tablet by mouth at bedtime.     methocarbamol (ROBAXIN) 750 MG tablet Take by mouth.     oxyCODONE-acetaminophen (PERCOCET) 10-325 MG tablet 1 tab po qid prn pain 20 tablet 0   aspirin 81 MG tablet Take 81 mg by mouth daily. (Patient not taking: Reported on 05/15/2022)     blood glucose meter kit and supplies KIT Dispense based on patient and insurance preference. Use to check glucose 1-2 times daily as directed. (Patient not taking: Reported on 05/15/2022) 1 each 0    glucose blood (ONETOUCH VERIO) test strip Use as instructed to check sugar 2 times daily (Patient not taking: Reported on 05/15/2022) 200 each 5   Insulin Pen Needle 31G X 4 MM MISC 1 each by Does not apply route 4 (four) times daily. (Patient not taking: Reported on 05/15/2022) 300 each 3   Lancet Devices (ONE TOUCH DELICA LANCING DEV) MISC Use to check sugar 2 times daily (Patient not taking: Reported on 05/15/2022) 200 each 5   metoprolol succinate (TOPROL-XL) 25 MG 24 hr tablet TAKE 1 TABLET BY MOUTH EVERY DAY (Patient not taking: Reported on 05/15/2022) 90  tablet 1   Multiple Vitamin (MULTIVITAMIN) tablet Take 1 tablet by mouth daily. (Patient not taking: Reported on 05/15/2022)     promethazine (PHENERGAN) 12.5 MG tablet 1-2 tabs po q6h prn nausea/vomiting (Patient not taking: Reported on 05/15/2022) 20 tablet 3   TRESIBA FLEXTOUCH 200 UNIT/ML FlexTouch Pen INJECT 50 UNITS INTO THE SKIN DAILY (Patient not taking: Reported on 05/15/2022) 15 mL 1   atorvastatin (LIPITOR) 80 MG tablet Take 1 tablet (80 mg total) by mouth daily. (Patient not taking: Reported on 05/15/2022) 90 tablet 3   busPIRone (BUSPAR) 5 MG tablet 1/2 tab po tid (Patient not taking: Reported on 05/15/2022) 45 tablet 1   butalbital-acetaminophen-caffeine (FIORICET) 50-325-40 MG tablet take 1 to 2 tablets by mouth twice a day if needed for headache (Patient not taking: Reported on 05/15/2022) 60 tablet 1   fluconazole (DIFLUCAN) 150 MG tablet 1 tab po qd x 5d (Patient not taking: Reported on 05/15/2022) 5 tablet 1   gabapentin (NEURONTIN) 300 MG capsule TAKE 1 CAPSULE BY MOUTH THREE TIMES A DAY (Patient not taking: Reported on 05/15/2022) 90 capsule 0   hydrochlorothiazide (HYDRODIURIL) 25 MG tablet Take 1 tablet (25 mg total) by mouth daily. (Patient not taking: Reported on 05/15/2022) 90 tablet 3   hydrocortisone (PROCTOSOL HC) 2.5 % rectal cream apply rectally twice a day (Patient not taking: Reported on 05/15/2022) 30 g 6    lisinopril (ZESTRIL) 40 MG tablet Take 1 tablet (40 mg total) by mouth daily. (Patient not taking: Reported on 05/15/2022) 90 tablet 3   LORazepam (ATIVAN) 0.5 MG tablet Take 2 tablets (1 mg total) by mouth at bedtime. (Patient not taking: Reported on 05/15/2022) 60 tablet 5   meloxicam (MOBIC) 15 MG tablet Take 1 tablet (15 mg total) by mouth daily. (Patient not taking: Reported on 05/15/2022) 7 tablet 0   meloxicam (MOBIC) 7.5 MG tablet TAKE 1 TABLET BY MOUTH EVERY DAY (Patient not taking: Reported on 05/15/2022) 30 tablet 3   metFORMIN (GLUCOPHAGE) 1000 MG tablet TAKE 1 TABLET (1,000 MG TOTAL) BY MOUTH 2 (TWO) TIMES DAILY WITH A MEAL. (Patient not taking: Reported on 05/15/2022) 60 tablet 0   NOVOLOG FLEXPEN 100 UNIT/ML FlexPen INJECT 14-18 UNITS INTO THE SKIN 3 (THREE) TIMES DAILY BEFORE MEALS. (Patient not taking: Reported on 05/15/2022) 15 mL 1   Omega-3 Fatty Acids (FISH OIL) 1000 MG CAPS Take 1 capsule by mouth at bedtime. (Patient not taking: Reported on 05/15/2022)     oxybutynin (DITROPAN-XL) 10 MG 24 hr tablet TAKE 1 TABLET BY MOUTH EVERY DAY (Patient not taking: Reported on 05/15/2022) 30 tablet 5   No facility-administered medications prior to visit.    Allergies  Allergen Reactions   Levofloxacin In D5w Hives   Penicillins     Blistery rash   Victoza [Liraglutide] Nausea Only    ROS As per HPI  PE:    05/15/2022    3:11 PM 03/21/2022    2:34 PM 06/24/2021    8:31 AM  Vitals with BMI  Height  5' 5.75" 5' 5.75"  Weight 175 lbs 13 oz 197 lbs 10 oz 216 lbs 10 oz  BMI  29.79 89.21  Systolic 194 174 081  Diastolic 72 80 74  Pulse 448 111 82     Physical Exam  Gen: Alert, chronically ill -appearing.  Walking with a walker.  No distress  patient is oriented to person, place, time, and situation.  She has muscle wasting in the face. No further exam today.  LABS:  Last CBC Lab Results  Component Value Date   WBC 4.5 04/01/2021   HGB 13.7 04/01/2021   HCT 40.2  04/01/2021   MCV 89.9 04/01/2021   RDW 13.1 04/01/2021   PLT 204.0 25/91/0289   Last metabolic panel Lab Results  Component Value Date   GLUCOSE 318 (H) 04/01/2021   NA 135 04/01/2021   K 3.9 04/01/2021   CL 100 04/01/2021   CO2 25 04/01/2021   BUN 12 04/01/2021   CREATININE 0.58 04/01/2021   CALCIUM 8.9 04/01/2021   PROT 6.5 04/01/2021   ALBUMIN 3.7 04/01/2021   BILITOT 0.5 04/01/2021   ALKPHOS 106 04/01/2021   AST 8 04/01/2021   ALT 7 04/01/2021   Last hemoglobin A1c Lab Results  Component Value Date   HGBA1C 14.8 Repeated and verified X2. (H) 04/01/2021   Lab Results  Component Value Date   TSH 0.90 04/01/2021   IMPRESSION AND PLAN:  #1 Staph arthritis left hip, status post I&D and extended hospitalization, currently on home IV antibiotics and getting home health physical therapy. She is definitely debilitated due to her pain.  Gait is unstable.  She does need ongoing home health PT. He has appropriate orthopedic follow-up on 06/02/2022. We will have to see what the next few months hold as far as her pain goes as well as potential consideration for hip replacement. She will continue Ancef 2 g IV every 8 until 05/29/2022 as per infectious disease recommendations. She has infectious disease follow-up next week.  #2 weight loss/malnourishment. She is down 22 pounds since the onset of her pain and illness. Recommended Ensure or boost or Glucerna every day.    #3 hip pain, severe. See #1 above. We will proceed cautiously with oxycodone 5 mg, 1 twice daily as needed. Continue Cymbalta 60 mg a day.  #4 hypertension, currently well controlled on losartan 25 mg a day.  #5 poorly controlled type 2 diabetes with complication, with long-term insulin use. Continue Levemir 14 units daily. She has Humalog to take before every meal as needed.  #6 hypokalemia, mild. Start K-Dur 20 mill equivalents once a day.  Spent 35 min with pt today reviewing extensive hospital  records as well as specialist outpatient evaluations.  HPI, reviewing relevant past history, reviewing and discussing lab and imaging data, and formulating plans.  FMLA paperwork to be completed: Start date 04/08/2022, expected return to work date 06/16/2022.  An After Visit Summary was printed and given to the patient.  FOLLOW UP: Return in about 3 weeks (around 06/05/2022) for f/u pain.  Signed:  Crissie Sickles, MD           05/15/2022

## 2022-05-16 ENCOUNTER — Telehealth: Payer: Self-pay

## 2022-05-16 DIAGNOSIS — M00052 Staphylococcal arthritis, left hip: Secondary | ICD-10-CM | POA: Diagnosis not present

## 2022-05-16 DIAGNOSIS — M25452 Effusion, left hip: Secondary | ICD-10-CM | POA: Diagnosis not present

## 2022-05-16 DIAGNOSIS — E042 Nontoxic multinodular goiter: Secondary | ICD-10-CM | POA: Diagnosis not present

## 2022-05-16 DIAGNOSIS — F3132 Bipolar disorder, current episode depressed, moderate: Secondary | ICD-10-CM | POA: Diagnosis not present

## 2022-05-16 DIAGNOSIS — D509 Iron deficiency anemia, unspecified: Secondary | ICD-10-CM | POA: Diagnosis not present

## 2022-05-16 DIAGNOSIS — E785 Hyperlipidemia, unspecified: Secondary | ICD-10-CM | POA: Diagnosis not present

## 2022-05-16 DIAGNOSIS — L89152 Pressure ulcer of sacral region, stage 2: Secondary | ICD-10-CM | POA: Diagnosis not present

## 2022-05-16 DIAGNOSIS — G44229 Chronic tension-type headache, not intractable: Secondary | ICD-10-CM | POA: Diagnosis not present

## 2022-05-16 DIAGNOSIS — R7881 Bacteremia: Secondary | ICD-10-CM | POA: Diagnosis not present

## 2022-05-16 DIAGNOSIS — M545 Low back pain, unspecified: Secondary | ICD-10-CM | POA: Diagnosis not present

## 2022-05-16 DIAGNOSIS — B9561 Methicillin susceptible Staphylococcus aureus infection as the cause of diseases classified elsewhere: Secondary | ICD-10-CM | POA: Diagnosis not present

## 2022-05-16 DIAGNOSIS — E1142 Type 2 diabetes mellitus with diabetic polyneuropathy: Secondary | ICD-10-CM | POA: Diagnosis not present

## 2022-05-16 DIAGNOSIS — G8929 Other chronic pain: Secondary | ICD-10-CM | POA: Diagnosis not present

## 2022-05-16 DIAGNOSIS — E1165 Type 2 diabetes mellitus with hyperglycemia: Secondary | ICD-10-CM | POA: Diagnosis not present

## 2022-05-16 DIAGNOSIS — I10 Essential (primary) hypertension: Secondary | ICD-10-CM | POA: Diagnosis not present

## 2022-05-16 DIAGNOSIS — F419 Anxiety disorder, unspecified: Secondary | ICD-10-CM | POA: Diagnosis not present

## 2022-05-16 NOTE — Telephone Encounter (Signed)
Patient was seen yesterday by Dr. Milinda Cave.  Patient brought in some forms to be completed and signed by provider. Forms did not give fax # where to send form when completed.  Please fax to [phone] Attn: Crustal

## 2022-05-18 DIAGNOSIS — R7881 Bacteremia: Secondary | ICD-10-CM | POA: Diagnosis not present

## 2022-05-18 DIAGNOSIS — M00052 Staphylococcal arthritis, left hip: Secondary | ICD-10-CM | POA: Diagnosis not present

## 2022-05-19 DIAGNOSIS — E785 Hyperlipidemia, unspecified: Secondary | ICD-10-CM | POA: Diagnosis not present

## 2022-05-19 DIAGNOSIS — L89152 Pressure ulcer of sacral region, stage 2: Secondary | ICD-10-CM | POA: Diagnosis not present

## 2022-05-19 DIAGNOSIS — E1165 Type 2 diabetes mellitus with hyperglycemia: Secondary | ICD-10-CM | POA: Diagnosis not present

## 2022-05-19 DIAGNOSIS — G44229 Chronic tension-type headache, not intractable: Secondary | ICD-10-CM | POA: Diagnosis not present

## 2022-05-19 DIAGNOSIS — M545 Low back pain, unspecified: Secondary | ICD-10-CM | POA: Diagnosis not present

## 2022-05-19 DIAGNOSIS — M25452 Effusion, left hip: Secondary | ICD-10-CM | POA: Diagnosis not present

## 2022-05-19 DIAGNOSIS — D509 Iron deficiency anemia, unspecified: Secondary | ICD-10-CM | POA: Diagnosis not present

## 2022-05-19 DIAGNOSIS — F419 Anxiety disorder, unspecified: Secondary | ICD-10-CM | POA: Diagnosis not present

## 2022-05-19 DIAGNOSIS — F3132 Bipolar disorder, current episode depressed, moderate: Secondary | ICD-10-CM | POA: Diagnosis not present

## 2022-05-19 DIAGNOSIS — M00052 Staphylococcal arthritis, left hip: Secondary | ICD-10-CM | POA: Diagnosis not present

## 2022-05-19 DIAGNOSIS — E1142 Type 2 diabetes mellitus with diabetic polyneuropathy: Secondary | ICD-10-CM | POA: Diagnosis not present

## 2022-05-19 DIAGNOSIS — I10 Essential (primary) hypertension: Secondary | ICD-10-CM | POA: Diagnosis not present

## 2022-05-19 DIAGNOSIS — G8929 Other chronic pain: Secondary | ICD-10-CM | POA: Diagnosis not present

## 2022-05-19 DIAGNOSIS — R7881 Bacteremia: Secondary | ICD-10-CM | POA: Diagnosis not present

## 2022-05-19 DIAGNOSIS — B9561 Methicillin susceptible Staphylococcus aureus infection as the cause of diseases classified elsewhere: Secondary | ICD-10-CM | POA: Diagnosis not present

## 2022-05-19 DIAGNOSIS — E042 Nontoxic multinodular goiter: Secondary | ICD-10-CM | POA: Diagnosis not present

## 2022-05-20 DIAGNOSIS — M00052 Staphylococcal arthritis, left hip: Secondary | ICD-10-CM | POA: Diagnosis not present

## 2022-05-20 DIAGNOSIS — R7881 Bacteremia: Secondary | ICD-10-CM | POA: Diagnosis not present

## 2022-05-21 DIAGNOSIS — M00052 Staphylococcal arthritis, left hip: Secondary | ICD-10-CM | POA: Diagnosis not present

## 2022-05-21 DIAGNOSIS — R7881 Bacteremia: Secondary | ICD-10-CM | POA: Diagnosis not present

## 2022-05-22 DIAGNOSIS — E1165 Type 2 diabetes mellitus with hyperglycemia: Secondary | ICD-10-CM | POA: Diagnosis not present

## 2022-05-22 DIAGNOSIS — L89152 Pressure ulcer of sacral region, stage 2: Secondary | ICD-10-CM | POA: Diagnosis not present

## 2022-05-22 DIAGNOSIS — E785 Hyperlipidemia, unspecified: Secondary | ICD-10-CM | POA: Diagnosis not present

## 2022-05-22 DIAGNOSIS — M00052 Staphylococcal arthritis, left hip: Secondary | ICD-10-CM | POA: Diagnosis not present

## 2022-05-22 DIAGNOSIS — E042 Nontoxic multinodular goiter: Secondary | ICD-10-CM | POA: Diagnosis not present

## 2022-05-22 DIAGNOSIS — I10 Essential (primary) hypertension: Secondary | ICD-10-CM | POA: Diagnosis not present

## 2022-05-22 DIAGNOSIS — F419 Anxiety disorder, unspecified: Secondary | ICD-10-CM | POA: Diagnosis not present

## 2022-05-22 DIAGNOSIS — B9561 Methicillin susceptible Staphylococcus aureus infection as the cause of diseases classified elsewhere: Secondary | ICD-10-CM | POA: Diagnosis not present

## 2022-05-22 DIAGNOSIS — D509 Iron deficiency anemia, unspecified: Secondary | ICD-10-CM | POA: Diagnosis not present

## 2022-05-22 DIAGNOSIS — R7881 Bacteremia: Secondary | ICD-10-CM | POA: Diagnosis not present

## 2022-05-22 DIAGNOSIS — G44229 Chronic tension-type headache, not intractable: Secondary | ICD-10-CM | POA: Diagnosis not present

## 2022-05-22 DIAGNOSIS — G8929 Other chronic pain: Secondary | ICD-10-CM | POA: Diagnosis not present

## 2022-05-22 DIAGNOSIS — M25452 Effusion, left hip: Secondary | ICD-10-CM | POA: Diagnosis not present

## 2022-05-22 DIAGNOSIS — E1142 Type 2 diabetes mellitus with diabetic polyneuropathy: Secondary | ICD-10-CM | POA: Diagnosis not present

## 2022-05-22 DIAGNOSIS — M545 Low back pain, unspecified: Secondary | ICD-10-CM | POA: Diagnosis not present

## 2022-05-22 DIAGNOSIS — F3132 Bipolar disorder, current episode depressed, moderate: Secondary | ICD-10-CM | POA: Diagnosis not present

## 2022-05-22 NOTE — Telephone Encounter (Signed)
Pt has called to check the status of FMLA forms. She would like to know if it has been faxed. Once again the information should be faxed to [phone] Attn: Crystal. Please give the patient a call once this has been completed.

## 2022-05-22 NOTE — Telephone Encounter (Signed)
Pt informed forms were faxed to number provided.

## 2022-05-23 ENCOUNTER — Telehealth: Payer: Self-pay

## 2022-05-23 DIAGNOSIS — M00052 Staphylococcal arthritis, left hip: Secondary | ICD-10-CM | POA: Diagnosis not present

## 2022-05-23 DIAGNOSIS — L89152 Pressure ulcer of sacral region, stage 2: Secondary | ICD-10-CM | POA: Diagnosis not present

## 2022-05-23 DIAGNOSIS — R7881 Bacteremia: Secondary | ICD-10-CM | POA: Diagnosis not present

## 2022-05-23 DIAGNOSIS — E1165 Type 2 diabetes mellitus with hyperglycemia: Secondary | ICD-10-CM | POA: Diagnosis not present

## 2022-05-23 DIAGNOSIS — Z794 Long term (current) use of insulin: Secondary | ICD-10-CM | POA: Diagnosis not present

## 2022-05-23 NOTE — Telephone Encounter (Signed)
Signed and put in box to go up front. Signed:  Phil Hillis Mcphatter, MD           05/23/2022  

## 2022-05-23 NOTE — Telephone Encounter (Signed)
Home health orders (SN, PT, OT)received 05/23/22 for [facility] health initiation orders: Yes.  Home health re-certification orders: No. Patient last seen by ordering physician for this condition: 05/15/22. Must be less than 90 days for re-certification and less than 30 days prior for initiation. Visit must have been for the condition the orders are being placed.  Patient meets criteria for Physician to sign orders: Yes.        Current med list has been attached: Yes        Orders placed on physicians desk for signature: 05/23/22 (date) If patient does not meet criteria for orders to be signed: pt was called to schedule appt. Appt is scheduled for n/a.   Kristi Guerrero

## 2022-05-24 DIAGNOSIS — R7881 Bacteremia: Secondary | ICD-10-CM | POA: Diagnosis not present

## 2022-05-24 DIAGNOSIS — M00052 Staphylococcal arthritis, left hip: Secondary | ICD-10-CM | POA: Diagnosis not present

## 2022-05-25 DIAGNOSIS — M00052 Staphylococcal arthritis, left hip: Secondary | ICD-10-CM | POA: Diagnosis not present

## 2022-05-25 DIAGNOSIS — R7881 Bacteremia: Secondary | ICD-10-CM | POA: Diagnosis not present

## 2022-05-26 DIAGNOSIS — E785 Hyperlipidemia, unspecified: Secondary | ICD-10-CM | POA: Diagnosis not present

## 2022-05-26 DIAGNOSIS — F3132 Bipolar disorder, current episode depressed, moderate: Secondary | ICD-10-CM | POA: Diagnosis not present

## 2022-05-26 DIAGNOSIS — E1165 Type 2 diabetes mellitus with hyperglycemia: Secondary | ICD-10-CM | POA: Diagnosis not present

## 2022-05-26 DIAGNOSIS — R7881 Bacteremia: Secondary | ICD-10-CM | POA: Diagnosis not present

## 2022-05-26 DIAGNOSIS — M25452 Effusion, left hip: Secondary | ICD-10-CM | POA: Diagnosis not present

## 2022-05-26 DIAGNOSIS — E1142 Type 2 diabetes mellitus with diabetic polyneuropathy: Secondary | ICD-10-CM | POA: Diagnosis not present

## 2022-05-26 DIAGNOSIS — M00052 Staphylococcal arthritis, left hip: Secondary | ICD-10-CM | POA: Diagnosis not present

## 2022-05-26 DIAGNOSIS — M545 Low back pain, unspecified: Secondary | ICD-10-CM | POA: Diagnosis not present

## 2022-05-26 DIAGNOSIS — I10 Essential (primary) hypertension: Secondary | ICD-10-CM | POA: Diagnosis not present

## 2022-05-26 DIAGNOSIS — D509 Iron deficiency anemia, unspecified: Secondary | ICD-10-CM | POA: Diagnosis not present

## 2022-05-26 DIAGNOSIS — G8929 Other chronic pain: Secondary | ICD-10-CM | POA: Diagnosis not present

## 2022-05-26 DIAGNOSIS — B9561 Methicillin susceptible Staphylococcus aureus infection as the cause of diseases classified elsewhere: Secondary | ICD-10-CM | POA: Diagnosis not present

## 2022-05-26 DIAGNOSIS — F419 Anxiety disorder, unspecified: Secondary | ICD-10-CM | POA: Diagnosis not present

## 2022-05-26 DIAGNOSIS — E042 Nontoxic multinodular goiter: Secondary | ICD-10-CM | POA: Diagnosis not present

## 2022-05-26 DIAGNOSIS — L89152 Pressure ulcer of sacral region, stage 2: Secondary | ICD-10-CM | POA: Diagnosis not present

## 2022-05-26 DIAGNOSIS — G44229 Chronic tension-type headache, not intractable: Secondary | ICD-10-CM | POA: Diagnosis not present

## 2022-05-27 DIAGNOSIS — M00052 Staphylococcal arthritis, left hip: Secondary | ICD-10-CM | POA: Diagnosis not present

## 2022-05-27 DIAGNOSIS — R7881 Bacteremia: Secondary | ICD-10-CM | POA: Diagnosis not present

## 2022-05-28 DIAGNOSIS — M00052 Staphylococcal arthritis, left hip: Secondary | ICD-10-CM | POA: Diagnosis not present

## 2022-05-28 DIAGNOSIS — R7881 Bacteremia: Secondary | ICD-10-CM | POA: Diagnosis not present

## 2022-05-29 ENCOUNTER — Encounter: Payer: Self-pay | Admitting: *Deleted

## 2022-05-29 DIAGNOSIS — F419 Anxiety disorder, unspecified: Secondary | ICD-10-CM | POA: Diagnosis not present

## 2022-05-29 DIAGNOSIS — L89152 Pressure ulcer of sacral region, stage 2: Secondary | ICD-10-CM | POA: Diagnosis not present

## 2022-05-29 DIAGNOSIS — F3132 Bipolar disorder, current episode depressed, moderate: Secondary | ICD-10-CM | POA: Diagnosis not present

## 2022-05-29 DIAGNOSIS — E1142 Type 2 diabetes mellitus with diabetic polyneuropathy: Secondary | ICD-10-CM | POA: Diagnosis not present

## 2022-05-29 DIAGNOSIS — M545 Low back pain, unspecified: Secondary | ICD-10-CM | POA: Diagnosis not present

## 2022-05-29 DIAGNOSIS — M25452 Effusion, left hip: Secondary | ICD-10-CM | POA: Diagnosis not present

## 2022-05-29 DIAGNOSIS — G44229 Chronic tension-type headache, not intractable: Secondary | ICD-10-CM | POA: Diagnosis not present

## 2022-05-29 DIAGNOSIS — M00052 Staphylococcal arthritis, left hip: Secondary | ICD-10-CM | POA: Diagnosis not present

## 2022-05-29 DIAGNOSIS — D509 Iron deficiency anemia, unspecified: Secondary | ICD-10-CM | POA: Diagnosis not present

## 2022-05-29 DIAGNOSIS — E1165 Type 2 diabetes mellitus with hyperglycemia: Secondary | ICD-10-CM | POA: Diagnosis not present

## 2022-05-29 DIAGNOSIS — R7881 Bacteremia: Secondary | ICD-10-CM | POA: Diagnosis not present

## 2022-05-29 DIAGNOSIS — E042 Nontoxic multinodular goiter: Secondary | ICD-10-CM | POA: Diagnosis not present

## 2022-05-29 DIAGNOSIS — I10 Essential (primary) hypertension: Secondary | ICD-10-CM | POA: Diagnosis not present

## 2022-05-29 DIAGNOSIS — G8929 Other chronic pain: Secondary | ICD-10-CM | POA: Diagnosis not present

## 2022-05-29 DIAGNOSIS — E785 Hyperlipidemia, unspecified: Secondary | ICD-10-CM | POA: Diagnosis not present

## 2022-05-29 DIAGNOSIS — B9561 Methicillin susceptible Staphylococcus aureus infection as the cause of diseases classified elsewhere: Secondary | ICD-10-CM | POA: Diagnosis not present

## 2022-05-30 DIAGNOSIS — B9561 Methicillin susceptible Staphylococcus aureus infection as the cause of diseases classified elsewhere: Secondary | ICD-10-CM | POA: Diagnosis not present

## 2022-05-30 DIAGNOSIS — I10 Essential (primary) hypertension: Secondary | ICD-10-CM | POA: Diagnosis not present

## 2022-05-30 DIAGNOSIS — L89152 Pressure ulcer of sacral region, stage 2: Secondary | ICD-10-CM | POA: Diagnosis not present

## 2022-05-30 DIAGNOSIS — M25452 Effusion, left hip: Secondary | ICD-10-CM | POA: Diagnosis not present

## 2022-05-30 DIAGNOSIS — G8929 Other chronic pain: Secondary | ICD-10-CM | POA: Diagnosis not present

## 2022-05-30 DIAGNOSIS — E1142 Type 2 diabetes mellitus with diabetic polyneuropathy: Secondary | ICD-10-CM | POA: Diagnosis not present

## 2022-05-30 DIAGNOSIS — M545 Low back pain, unspecified: Secondary | ICD-10-CM | POA: Diagnosis not present

## 2022-05-30 DIAGNOSIS — D509 Iron deficiency anemia, unspecified: Secondary | ICD-10-CM | POA: Diagnosis not present

## 2022-05-30 DIAGNOSIS — E042 Nontoxic multinodular goiter: Secondary | ICD-10-CM | POA: Diagnosis not present

## 2022-05-30 DIAGNOSIS — F3132 Bipolar disorder, current episode depressed, moderate: Secondary | ICD-10-CM | POA: Diagnosis not present

## 2022-05-30 DIAGNOSIS — R7881 Bacteremia: Secondary | ICD-10-CM | POA: Diagnosis not present

## 2022-05-30 DIAGNOSIS — E1165 Type 2 diabetes mellitus with hyperglycemia: Secondary | ICD-10-CM | POA: Diagnosis not present

## 2022-05-30 DIAGNOSIS — F419 Anxiety disorder, unspecified: Secondary | ICD-10-CM | POA: Diagnosis not present

## 2022-05-30 DIAGNOSIS — G44229 Chronic tension-type headache, not intractable: Secondary | ICD-10-CM | POA: Diagnosis not present

## 2022-05-30 DIAGNOSIS — E785 Hyperlipidemia, unspecified: Secondary | ICD-10-CM | POA: Diagnosis not present

## 2022-05-30 DIAGNOSIS — M00052 Staphylococcal arthritis, left hip: Secondary | ICD-10-CM | POA: Diagnosis not present

## 2022-06-02 DIAGNOSIS — M009 Pyogenic arthritis, unspecified: Secondary | ICD-10-CM | POA: Diagnosis not present

## 2022-06-03 DIAGNOSIS — M00052 Staphylococcal arthritis, left hip: Secondary | ICD-10-CM | POA: Diagnosis not present

## 2022-06-03 DIAGNOSIS — E1165 Type 2 diabetes mellitus with hyperglycemia: Secondary | ICD-10-CM | POA: Diagnosis not present

## 2022-06-03 DIAGNOSIS — R7881 Bacteremia: Secondary | ICD-10-CM | POA: Diagnosis not present

## 2022-06-03 DIAGNOSIS — B9561 Methicillin susceptible Staphylococcus aureus infection as the cause of diseases classified elsewhere: Secondary | ICD-10-CM | POA: Diagnosis not present

## 2022-06-03 DIAGNOSIS — D509 Iron deficiency anemia, unspecified: Secondary | ICD-10-CM | POA: Diagnosis not present

## 2022-06-03 DIAGNOSIS — M545 Low back pain, unspecified: Secondary | ICD-10-CM | POA: Diagnosis not present

## 2022-06-03 DIAGNOSIS — I10 Essential (primary) hypertension: Secondary | ICD-10-CM | POA: Diagnosis not present

## 2022-06-03 DIAGNOSIS — M25452 Effusion, left hip: Secondary | ICD-10-CM | POA: Diagnosis not present

## 2022-06-03 DIAGNOSIS — E042 Nontoxic multinodular goiter: Secondary | ICD-10-CM | POA: Diagnosis not present

## 2022-06-03 DIAGNOSIS — F419 Anxiety disorder, unspecified: Secondary | ICD-10-CM | POA: Diagnosis not present

## 2022-06-03 DIAGNOSIS — F3132 Bipolar disorder, current episode depressed, moderate: Secondary | ICD-10-CM | POA: Diagnosis not present

## 2022-06-03 DIAGNOSIS — G44229 Chronic tension-type headache, not intractable: Secondary | ICD-10-CM | POA: Diagnosis not present

## 2022-06-03 DIAGNOSIS — E1142 Type 2 diabetes mellitus with diabetic polyneuropathy: Secondary | ICD-10-CM | POA: Diagnosis not present

## 2022-06-03 DIAGNOSIS — E785 Hyperlipidemia, unspecified: Secondary | ICD-10-CM | POA: Diagnosis not present

## 2022-06-03 DIAGNOSIS — L89152 Pressure ulcer of sacral region, stage 2: Secondary | ICD-10-CM | POA: Diagnosis not present

## 2022-06-03 DIAGNOSIS — G8929 Other chronic pain: Secondary | ICD-10-CM | POA: Diagnosis not present

## 2022-06-05 DIAGNOSIS — E1165 Type 2 diabetes mellitus with hyperglycemia: Secondary | ICD-10-CM | POA: Diagnosis not present

## 2022-06-05 DIAGNOSIS — D509 Iron deficiency anemia, unspecified: Secondary | ICD-10-CM | POA: Diagnosis not present

## 2022-06-05 DIAGNOSIS — M545 Low back pain, unspecified: Secondary | ICD-10-CM | POA: Diagnosis not present

## 2022-06-05 DIAGNOSIS — L89152 Pressure ulcer of sacral region, stage 2: Secondary | ICD-10-CM | POA: Diagnosis not present

## 2022-06-05 DIAGNOSIS — I10 Essential (primary) hypertension: Secondary | ICD-10-CM | POA: Diagnosis not present

## 2022-06-05 DIAGNOSIS — E042 Nontoxic multinodular goiter: Secondary | ICD-10-CM | POA: Diagnosis not present

## 2022-06-05 DIAGNOSIS — B9561 Methicillin susceptible Staphylococcus aureus infection as the cause of diseases classified elsewhere: Secondary | ICD-10-CM | POA: Diagnosis not present

## 2022-06-05 DIAGNOSIS — M25452 Effusion, left hip: Secondary | ICD-10-CM | POA: Diagnosis not present

## 2022-06-05 DIAGNOSIS — R7881 Bacteremia: Secondary | ICD-10-CM | POA: Diagnosis not present

## 2022-06-05 DIAGNOSIS — G8929 Other chronic pain: Secondary | ICD-10-CM | POA: Diagnosis not present

## 2022-06-05 DIAGNOSIS — F3132 Bipolar disorder, current episode depressed, moderate: Secondary | ICD-10-CM | POA: Diagnosis not present

## 2022-06-05 DIAGNOSIS — G44229 Chronic tension-type headache, not intractable: Secondary | ICD-10-CM | POA: Diagnosis not present

## 2022-06-05 DIAGNOSIS — M00052 Staphylococcal arthritis, left hip: Secondary | ICD-10-CM | POA: Diagnosis not present

## 2022-06-05 DIAGNOSIS — E785 Hyperlipidemia, unspecified: Secondary | ICD-10-CM | POA: Diagnosis not present

## 2022-06-05 DIAGNOSIS — E1142 Type 2 diabetes mellitus with diabetic polyneuropathy: Secondary | ICD-10-CM | POA: Diagnosis not present

## 2022-06-05 DIAGNOSIS — F419 Anxiety disorder, unspecified: Secondary | ICD-10-CM | POA: Diagnosis not present

## 2022-06-13 ENCOUNTER — Other Ambulatory Visit: Payer: Self-pay | Admitting: Family Medicine

## 2022-06-26 ENCOUNTER — Other Ambulatory Visit: Payer: Self-pay | Admitting: Family Medicine

## 2022-07-04 DIAGNOSIS — E782 Mixed hyperlipidemia: Secondary | ICD-10-CM | POA: Diagnosis not present

## 2022-07-04 DIAGNOSIS — I10 Essential (primary) hypertension: Secondary | ICD-10-CM | POA: Diagnosis not present

## 2022-07-04 DIAGNOSIS — E049 Nontoxic goiter, unspecified: Secondary | ICD-10-CM | POA: Diagnosis not present

## 2022-07-04 DIAGNOSIS — Z794 Long term (current) use of insulin: Secondary | ICD-10-CM | POA: Diagnosis not present

## 2022-07-04 DIAGNOSIS — E1165 Type 2 diabetes mellitus with hyperglycemia: Secondary | ICD-10-CM | POA: Diagnosis not present

## 2022-07-29 ENCOUNTER — Other Ambulatory Visit: Payer: Self-pay | Admitting: Family Medicine

## 2022-07-29 DIAGNOSIS — Z1231 Encounter for screening mammogram for malignant neoplasm of breast: Secondary | ICD-10-CM

## 2022-09-25 ENCOUNTER — Other Ambulatory Visit: Payer: Self-pay | Admitting: Family Medicine

## 2022-10-09 DIAGNOSIS — Z794 Long term (current) use of insulin: Secondary | ICD-10-CM | POA: Diagnosis not present

## 2022-10-09 DIAGNOSIS — E1165 Type 2 diabetes mellitus with hyperglycemia: Secondary | ICD-10-CM | POA: Diagnosis not present

## 2022-10-09 DIAGNOSIS — E782 Mixed hyperlipidemia: Secondary | ICD-10-CM | POA: Diagnosis not present

## 2022-10-09 DIAGNOSIS — I10 Essential (primary) hypertension: Secondary | ICD-10-CM | POA: Diagnosis not present

## 2022-10-09 DIAGNOSIS — E1142 Type 2 diabetes mellitus with diabetic polyneuropathy: Secondary | ICD-10-CM | POA: Diagnosis not present

## 2022-10-09 DIAGNOSIS — E042 Nontoxic multinodular goiter: Secondary | ICD-10-CM | POA: Diagnosis not present

## 2022-10-09 DIAGNOSIS — R5383 Other fatigue: Secondary | ICD-10-CM | POA: Diagnosis not present

## 2022-10-10 DIAGNOSIS — H25013 Cortical age-related cataract, bilateral: Secondary | ICD-10-CM | POA: Diagnosis not present

## 2022-10-10 DIAGNOSIS — H25043 Posterior subcapsular polar age-related cataract, bilateral: Secondary | ICD-10-CM | POA: Diagnosis not present

## 2022-10-10 DIAGNOSIS — H2513 Age-related nuclear cataract, bilateral: Secondary | ICD-10-CM | POA: Diagnosis not present

## 2022-10-10 DIAGNOSIS — E113293 Type 2 diabetes mellitus with mild nonproliferative diabetic retinopathy without macular edema, bilateral: Secondary | ICD-10-CM | POA: Diagnosis not present

## 2022-10-10 DIAGNOSIS — H524 Presbyopia: Secondary | ICD-10-CM | POA: Diagnosis not present

## 2022-10-10 DIAGNOSIS — H5203 Hypermetropia, bilateral: Secondary | ICD-10-CM | POA: Diagnosis not present

## 2022-11-28 DIAGNOSIS — E1140 Type 2 diabetes mellitus with diabetic neuropathy, unspecified: Secondary | ICD-10-CM | POA: Diagnosis not present

## 2022-11-28 DIAGNOSIS — Z794 Long term (current) use of insulin: Secondary | ICD-10-CM | POA: Diagnosis not present

## 2022-11-28 DIAGNOSIS — E1165 Type 2 diabetes mellitus with hyperglycemia: Secondary | ICD-10-CM | POA: Diagnosis not present

## 2022-11-28 DIAGNOSIS — E11319 Type 2 diabetes mellitus with unspecified diabetic retinopathy without macular edema: Secondary | ICD-10-CM | POA: Diagnosis not present

## 2022-12-09 DIAGNOSIS — H25041 Posterior subcapsular polar age-related cataract, right eye: Secondary | ICD-10-CM | POA: Diagnosis not present

## 2022-12-09 DIAGNOSIS — H25811 Combined forms of age-related cataract, right eye: Secondary | ICD-10-CM | POA: Diagnosis not present

## 2022-12-09 DIAGNOSIS — H2511 Age-related nuclear cataract, right eye: Secondary | ICD-10-CM | POA: Diagnosis not present

## 2022-12-09 DIAGNOSIS — H25011 Cortical age-related cataract, right eye: Secondary | ICD-10-CM | POA: Diagnosis not present

## 2022-12-17 DIAGNOSIS — E041 Nontoxic single thyroid nodule: Secondary | ICD-10-CM | POA: Diagnosis not present

## 2022-12-17 DIAGNOSIS — E1165 Type 2 diabetes mellitus with hyperglycemia: Secondary | ICD-10-CM | POA: Diagnosis not present

## 2022-12-17 DIAGNOSIS — E042 Nontoxic multinodular goiter: Secondary | ICD-10-CM | POA: Diagnosis not present

## 2022-12-17 DIAGNOSIS — Z794 Long term (current) use of insulin: Secondary | ICD-10-CM | POA: Diagnosis not present

## 2022-12-23 DIAGNOSIS — H25812 Combined forms of age-related cataract, left eye: Secondary | ICD-10-CM | POA: Diagnosis not present

## 2022-12-23 DIAGNOSIS — H2512 Age-related nuclear cataract, left eye: Secondary | ICD-10-CM | POA: Diagnosis not present

## 2022-12-23 DIAGNOSIS — H25012 Cortical age-related cataract, left eye: Secondary | ICD-10-CM | POA: Diagnosis not present

## 2022-12-23 DIAGNOSIS — H25042 Posterior subcapsular polar age-related cataract, left eye: Secondary | ICD-10-CM | POA: Diagnosis not present

## 2023-03-27 DIAGNOSIS — E1165 Type 2 diabetes mellitus with hyperglycemia: Secondary | ICD-10-CM | POA: Diagnosis not present

## 2023-03-27 DIAGNOSIS — E1140 Type 2 diabetes mellitus with diabetic neuropathy, unspecified: Secondary | ICD-10-CM | POA: Diagnosis not present

## 2023-03-27 DIAGNOSIS — L97421 Non-pressure chronic ulcer of left heel and midfoot limited to breakdown of skin: Secondary | ICD-10-CM | POA: Diagnosis not present

## 2023-03-27 DIAGNOSIS — E11621 Type 2 diabetes mellitus with foot ulcer: Secondary | ICD-10-CM | POA: Diagnosis not present

## 2023-04-10 DIAGNOSIS — F5101 Primary insomnia: Secondary | ICD-10-CM | POA: Diagnosis not present

## 2023-04-10 DIAGNOSIS — I10 Essential (primary) hypertension: Secondary | ICD-10-CM | POA: Diagnosis not present

## 2023-04-10 DIAGNOSIS — E782 Mixed hyperlipidemia: Secondary | ICD-10-CM | POA: Diagnosis not present

## 2023-04-10 DIAGNOSIS — Z23 Encounter for immunization: Secondary | ICD-10-CM | POA: Diagnosis not present

## 2023-04-10 DIAGNOSIS — F3132 Bipolar disorder, current episode depressed, moderate: Secondary | ICD-10-CM | POA: Diagnosis not present

## 2023-04-13 DIAGNOSIS — E11621 Type 2 diabetes mellitus with foot ulcer: Secondary | ICD-10-CM | POA: Diagnosis not present

## 2023-04-13 DIAGNOSIS — R600 Localized edema: Secondary | ICD-10-CM | POA: Diagnosis not present

## 2023-04-13 DIAGNOSIS — R609 Edema, unspecified: Secondary | ICD-10-CM | POA: Diagnosis not present

## 2023-04-13 DIAGNOSIS — Z794 Long term (current) use of insulin: Secondary | ICD-10-CM | POA: Diagnosis not present

## 2023-04-13 DIAGNOSIS — E1165 Type 2 diabetes mellitus with hyperglycemia: Secondary | ICD-10-CM | POA: Diagnosis not present

## 2023-04-13 DIAGNOSIS — Z7984 Long term (current) use of oral hypoglycemic drugs: Secondary | ICD-10-CM | POA: Diagnosis not present

## 2023-04-13 DIAGNOSIS — Z7985 Long-term (current) use of injectable non-insulin antidiabetic drugs: Secondary | ICD-10-CM | POA: Diagnosis not present

## 2023-05-01 DIAGNOSIS — J3489 Other specified disorders of nose and nasal sinuses: Secondary | ICD-10-CM | POA: Diagnosis not present
